# Patient Record
Sex: Female | Born: 1965
Health system: Southern US, Community
[De-identification: ages and names within clinical notes are randomized; demographics above are authoritative.]

## PROBLEM LIST (undated history)

## (undated) DIAGNOSIS — N201 Calculus of ureter: Secondary | ICD-10-CM

## (undated) DIAGNOSIS — K5730 Diverticulosis of large intestine without perforation or abscess without bleeding: Secondary | ICD-10-CM

## (undated) DIAGNOSIS — Z8719 Personal history of other diseases of the digestive system: Secondary | ICD-10-CM

## (undated) DIAGNOSIS — N8110 Cystocele, unspecified: Secondary | ICD-10-CM

## (undated) DIAGNOSIS — R112 Nausea with vomiting, unspecified: Secondary | ICD-10-CM

## (undated) DIAGNOSIS — Z860100 Personal history of colon polyps, unspecified: Secondary | ICD-10-CM

## (undated) DIAGNOSIS — Z8489 Family history of other specified conditions: Secondary | ICD-10-CM

## (undated) DIAGNOSIS — N200 Calculus of kidney: Secondary | ICD-10-CM

## (undated) DIAGNOSIS — Z9889 Other specified postprocedural states: Secondary | ICD-10-CM

## (undated) DIAGNOSIS — E119 Type 2 diabetes mellitus without complications: Secondary | ICD-10-CM

## (undated) HISTORY — PX: BLADDER SUSPENSION: SHX72

## (undated) HISTORY — DX: Type 2 diabetes mellitus without complications: E11.9

## (undated) HISTORY — PX: COLONOSCOPY: SHX174

## (undated) HISTORY — PX: ROTATOR CUFF REPAIR: SHX139

---

## 1990-03-31 HISTORY — PX: TUBAL LIGATION: SHX77

## 2001-03-31 HISTORY — PX: TONSILLECTOMY: SUR1361

## 2001-07-12 ENCOUNTER — Emergency Department (HOSPITAL_COMMUNITY): Admission: EM | Admit: 2001-07-12 | Discharge: 2001-07-12 | Payer: Self-pay | Admitting: Emergency Medicine

## 2001-07-12 ENCOUNTER — Encounter: Payer: Self-pay | Admitting: Emergency Medicine

## 2003-04-01 HISTORY — PX: VAGINAL HYSTERECTOMY: SUR661

## 2003-04-27 ENCOUNTER — Observation Stay (HOSPITAL_COMMUNITY): Admission: RE | Admit: 2003-04-27 | Discharge: 2003-04-28 | Payer: Self-pay | Admitting: *Deleted

## 2014-08-16 ENCOUNTER — Encounter (INDEPENDENT_AMBULATORY_CARE_PROVIDER_SITE_OTHER): Payer: Self-pay | Admitting: *Deleted

## 2015-08-07 ENCOUNTER — Encounter: Payer: Self-pay | Admitting: Gastroenterology

## 2015-09-10 ENCOUNTER — Other Ambulatory Visit: Payer: Self-pay | Admitting: Urology

## 2015-09-12 ENCOUNTER — Encounter (HOSPITAL_COMMUNITY): Payer: Self-pay

## 2015-09-12 ENCOUNTER — Telehealth: Payer: Self-pay | Admitting: Gastroenterology

## 2015-09-17 NOTE — Telephone Encounter (Signed)
Dr.Stark has reviewed records and has agreed to see patient. Due for next colonoscopy August of 2021. Recall colon entered.

## 2015-09-19 ENCOUNTER — Encounter: Payer: Self-pay | Admitting: Gastroenterology

## 2015-09-20 ENCOUNTER — Ambulatory Visit (HOSPITAL_COMMUNITY): Payer: 59

## 2015-09-20 ENCOUNTER — Ambulatory Visit (HOSPITAL_COMMUNITY)
Admission: RE | Admit: 2015-09-20 | Discharge: 2015-09-20 | Disposition: A | Discharge status: Home / Self Care | Payer: 59 | Source: Ambulatory Visit | Attending: Urology | Admitting: Urology

## 2015-09-20 ENCOUNTER — Encounter (HOSPITAL_COMMUNITY): Payer: Self-pay

## 2015-09-20 ENCOUNTER — Encounter (HOSPITAL_COMMUNITY)
Admission: RE | Disposition: A | Discharge status: Home / Self Care | Payer: Self-pay | Source: Ambulatory Visit | Attending: Urology

## 2015-09-20 DIAGNOSIS — F17200 Nicotine dependence, unspecified, uncomplicated: Secondary | ICD-10-CM | POA: Insufficient documentation

## 2015-09-20 DIAGNOSIS — N200 Calculus of kidney: Secondary | ICD-10-CM | POA: Diagnosis not present

## 2015-09-20 DIAGNOSIS — Z7982 Long term (current) use of aspirin: Secondary | ICD-10-CM | POA: Insufficient documentation

## 2015-09-20 DIAGNOSIS — Z791 Long term (current) use of non-steroidal anti-inflammatories (NSAID): Secondary | ICD-10-CM | POA: Diagnosis not present

## 2015-09-20 HISTORY — PX: EXTRACORPOREAL SHOCK WAVE LITHOTRIPSY: SHX1557

## 2015-09-20 HISTORY — DX: Other specified postprocedural states: R11.2

## 2015-09-20 HISTORY — DX: Other specified postprocedural states: Z98.890

## 2015-09-20 HISTORY — DX: Nausea with vomiting, unspecified: R11.2

## 2015-09-20 SURGERY — LITHOTRIPSY, ESWL
Anesthesia: LOCAL | Laterality: Right

## 2015-09-20 MED ORDER — SODIUM CHLORIDE 0.9 % IV SOLN
INTRAVENOUS | Status: DC
  Administered 2015-09-20 (×2): via INTRAVENOUS

## 2015-09-20 MED ORDER — CIPROFLOXACIN HCL 500 MG PO TABS
500.0000 mg | ORAL_TABLET | ORAL | Status: AC
  Administered 2015-09-20: 500 mg via ORAL
  Filled 2015-09-20: qty 1

## 2015-09-20 MED ORDER — HYDROCODONE-ACETAMINOPHEN 5-325 MG PO TABS
1.0000 | ORAL_TABLET | Freq: Four times a day (QID) | ORAL | Status: DC | PRN
  Administered 2015-09-20: 1 via ORAL
  Filled 2015-09-20: qty 1

## 2015-09-20 MED ORDER — DIPHENHYDRAMINE HCL 25 MG PO CAPS
25.0000 mg | ORAL_CAPSULE | ORAL | Status: AC
  Administered 2015-09-20: 25 mg via ORAL
  Filled 2015-09-20: qty 1

## 2015-09-20 MED ORDER — DIAZEPAM 5 MG PO TABS
10.0000 mg | ORAL_TABLET | ORAL | Status: AC
  Administered 2015-09-20: 10 mg via ORAL
  Filled 2015-09-20: qty 2

## 2015-09-20 MED ORDER — TAMSULOSIN HCL 0.4 MG PO CAPS: 0.4000 mg | ORAL_CAPSULE | Freq: Every day | ORAL | Status: DC

## 2015-09-20 NOTE — Discharge Instructions (Signed)
See [facility] discharge instructions in chart.     Moderate Conscious Sedation, Adult, Care After Refer to this sheet in the next few weeks. These instructions provide you with information on caring for yourself after your procedure. Your health care provider may also give you more specific instructions. Your treatment has been planned according to current medical practices, but problems sometimes occur. Call your health care provider if you have any problems or questions after your procedure. WHAT TO EXPECT AFTER THE PROCEDURE  After your procedure:  You may feel sleepy, clumsy, and have poor balance for several hours.  Vomiting may occur if you eat too soon after the procedure. HOME CARE INSTRUCTIONS  Do not participate in any activities where you could become injured for at least 24 hours. Do not:  Drive.  Swim.  Ride a bicycle.  Operate heavy machinery.  Cook.  Use power tools.  Climb ladders.  Work from a high place.  Do not make important decisions or sign legal documents until you are improved.  If you vomit, drink water, juice, or soup when you can drink without vomiting. Make sure you have little or no nausea before eating solid foods.  Only take over-the-counter or prescription medicines for pain, discomfort, or fever as directed by your health care provider.  Make sure you and your family fully understand everything about the medicines given to you, including what side effects may occur.  You should not drink alcohol, take sleeping pills, or take medicines that cause drowsiness for at least 24 hours.  If you smoke, do not smoke without supervision.  If you are feeling better, you may resume normal activities 24 hours after you were sedated.  Keep all appointments with your health care provider. SEEK MEDICAL CARE IF:  Your skin is pale or bluish in color.  You continue to feel nauseous or vomit.  Your pain is getting worse and is not helped  by medicine.  You have bleeding or swelling.  You are still sleepy or feeling clumsy after 24 hours. SEEK IMMEDIATE MEDICAL CARE IF:  You develop a rash.  You have difficulty breathing.  You develop any type of allergic problem.  You have a fever. MAKE SURE YOU:  Understand these instructions.  Will watch your condition.  Will get help right away if you are not doing well or get worse.   This information is not intended to replace advice given to you by your health care provider. Make sure you discuss any questions you have with your health care provider.   Document Released: 01/05/2013 Document Revised: 04/07/2014 Document Reviewed: 01/05/2013 Elsevier Interactive Patient Education [employer].

## 2015-09-20 NOTE — Op Note (Signed)
See [facility] OP note scanned into chart. Also because of the size, density, location and other factors that cannot be anticipated I feel this will likely be a staged procedure. This fact supersedes any indication in the scanned [facility] operative note to the contrary.  

## 2015-09-20 NOTE — H&P (Signed)
History of Present Illness Pt was evaluated a few weeks ago for possible obstructive uropathy but KUB showed no obvious ureteral obstruction. Urine c/s was pos. for infection and pt was placed on abx coverage. She also informed staff that she was being treated for diverticulitis when c/s results were alerted to her. I had suspicions for GI issues at our initial visit so the diverticulitis diagnosis supports such. I attempted to review her chart in the Epic system but there were no recent office visit notes regarding any diagnosis of diverticulitis.  She states today that she stopped the bactrim and was placed on cipro and flagyl for treatment of diverticulitis. She has completed both courses of those medications and is feeling significantly better today with no additional abdominal or back pain. She denies other LUTS today as well. She is also absent of fever. A CT image was [facility] at [facility] that indicated a non-obstructing 90mm calculus in the right kidney. I had previously seen this nonobstructing calculi on KUB imaging performed on 5/1. Mrs Nearing has a f/u appointment with lower GI in the next few weeks for further evaluation of her diverticulitis diagnosis. Vitals Vital Signs [Data Includes: Last 1 Day]  Recorded: [id number] 03:57PM  Blood Pressure: 119 / 77 Temperature: 98.3 F Heart Rate: 76 Physical Exam Constitutional: Well nourished and well developed . No acute distress.  Abdomen: The abdomen is soft and nontender. No masses are palpated. No CVA tenderness. No hernias are palpable. No hepatosplenomegaly noted.  Results/Data Urine [Data Includes: Last 1 Day]   [id number]  COLOR YELLOW   APPEARANCE CLOUDY   SPECIFIC GRAVITY 1.010   pH 7.0   GLUCOSE NEGATIVE   BILIRUBIN NEGATIVE   KETONE NEGATIVE   BLOOD 2+   PROTEIN NEGATIVE   NITRITE NEGATIVE   LEUKOCYTE ESTERASE NEGATIVE   SQUAMOUS EPITHELIAL/HPF 0-5 HPF  WBC NONE SEEN WBC/HPF  RBC 3-10 RBC/HPF  BACTERIA FEW HPF   CRYSTALS See Below HPF  CASTS NONE SEEN LPF  Yeast NONE SEEN HPF  Old records or history reviewed: Recent CT imaging reviewed in PACS system.  The following clinical lab reports were reviewed: UA with 3-10 red blood cells per high-power field and few bacteria noted. There is also amorphus sediment.  Assessment Assessed  1. Nephrolithiasis (N20.0) Plan Health Maintenance  1. UA With REFLEX; [Do Not Release]; Status:Complete; [id number] 03:45PM Nephrolithiasis  2. KUB; Status:Hold For - Exact Date,Appointment,Date of Service; Requested for:With next appointment;  3. Follow-up MD Office Follow-up Status: Hold For - Appointment,Date of Service  Requested for: [id number] Urine will be re-cultured today. I am suspicious that the stone in the right kidney may serve as a nidus for recurrent UTI. Pt is inquiring about treatment for such before she develops obstructive symptoms, colic.  I'll send a note to Dr Gaynelle Arabian and inquire about treatment. It is difficult to say whether lithotripsy would be a good treatment idea for her noting her recent infectious processes so I will defer judgement to her urologist. General kidney stone measures discussed with emphasis on increasing hydration, adding lemons to water for citric acid and avoidance of dietary oxalates.

## 2015-09-20 NOTE — Progress Notes (Signed)
Asst up to void. Steady on feet

## 2015-10-05 ENCOUNTER — Ambulatory Visit: Payer: Self-pay | Admitting: Gastroenterology

## 2015-11-26 ENCOUNTER — Ambulatory Visit (INDEPENDENT_AMBULATORY_CARE_PROVIDER_SITE_OTHER): Payer: 59 | Admitting: Gastroenterology

## 2015-11-26 ENCOUNTER — Encounter: Payer: Self-pay | Admitting: Gastroenterology

## 2015-11-26 VITALS — BP 120/80 | HR 88 | Ht 66.0 in | Wt 190.8 lb

## 2015-11-26 DIAGNOSIS — R140 Abdominal distension (gaseous): Secondary | ICD-10-CM

## 2015-11-26 DIAGNOSIS — K5900 Constipation, unspecified: Secondary | ICD-10-CM | POA: Diagnosis not present

## 2015-11-26 NOTE — Progress Notes (Signed)
    History of Present Illness: This is a 50 year old female self referred for the evaluation of chronic constipation, bloating, nausea and left lower quadrant pain. She is accompanied by her husband. She underwent evaluation in [place] and I have reviewed her records. She had a colonoscopy performed by Dr. Doristine Mango in August 2016 showing diffuse melanosis coli and a hyperplastic colon polyp. The exam was limited by the severity of melanosis coli. In May 2017 an abdominal/pelvic CT scan was performed for left lower quadrant pain that showed mild pericolonic inflammatory changes associated with diverticulosis. She states she was treated twice for diverticulitis. She takes an herbal laxative and MiraLAX with inadequate control of chronic constipation. She states she had a KUB performed in Dr. Arlyn Leak office recently that showed an increased colonic stool burden. Denies weight loss, diarrhea, change in stool caliber, melena, hematochezia, nausea, vomiting, dysphagia, reflux symptoms, chest pain.  Review of Systems: Pertinent positive and negative review of systems were noted in the above HPI section. All other review of systems were otherwise negative.  Current Medications, Allergies, Past Medical History, Past Surgical History, Family History and Social History were reviewed in [employer] record.  Physical Exam: General: Well developed, well nourished, no acute distress Head: Normocephalic and atraumatic Eyes:  sclerae anicteric, EOMI Ears: Normal auditory acuity Mouth: No deformity or lesions Neck: Supple, no masses or thyromegaly Lungs: Clear throughout to auscultation Heart: Regular rate and rhythm; no murmurs, rubs or bruits Abdomen: Soft, non tender and non distended. No masses, hepatosplenomegaly or hernias noted. Normal Bowel sounds Rectal: deferred Musculoskeletal: Symmetrical with no gross deformities  Skin: No lesions on visible extremities Pulses:   Normal pulses noted Extremities: No clubbing, cyanosis, edema or deformities noted Neurological: Alert oriented x 4, grossly nonfocal Cervical Nodes:  No significant cervical adenopathy Inguinal Nodes: No significant inguinal adenopathy Psychological:  Alert and cooperative. Normal mood and affect  Assessment and Recommendations:  1. Chronic constipation associated with abdominal bloating, nausea and occasional left lower quadrant pain and nausea. Increase MiraLAX to 2-3 times daily for the next several weeks and if her symptoms are not adequately controlled she will call us to start treatment with Trulance or Linzess.  2. History of diverticulosis and diverticulitis. Long-term high fiber diet with adequate daily water intake.  3. Colorectal cancer screening plan for August 2021 at a five-year interval given the severity of melanosis that limited the quality of the exam.

## 2015-11-26 NOTE — Patient Instructions (Signed)
Increase your Miralax to 2-3 x daily.   Call back in one month with a progress report of your symptoms.   Normal BMI (Body Mass Index- based on height and weight) is between 19 and 25. Your BMI today is Body mass index is 30.8 kg/m. Marland Kitchen Please consider follow up  regarding your BMI with your Primary Care Provider.  Thank you for choosing me and [facility].  Pricilla Riffle. Dagoberto Ligas., MD., Marval Regal

## 2015-12-04 ENCOUNTER — Encounter: Payer: Self-pay | Admitting: Gastroenterology

## 2015-12-21 ENCOUNTER — Other Ambulatory Visit: Payer: Self-pay | Admitting: Urology

## 2016-02-01 ENCOUNTER — Encounter (HOSPITAL_BASED_OUTPATIENT_CLINIC_OR_DEPARTMENT_OTHER): Payer: Self-pay | Admitting: *Deleted

## 2016-02-01 NOTE — Progress Notes (Signed)
NPO AFTER MN.  ARRIVE AT 0600.  NEEDS HG.  

## 2016-02-06 NOTE — Anesthesia Preprocedure Evaluation (Addendum)
Anesthesia Evaluation  Patient identified by MRN, date of birth, ID band Patient awake    Reviewed: Allergy & Precautions, NPO status , Patient's Chart, lab work & pertinent test results  History of Anesthesia Complications (+) PONV, Family history of anesthesia reaction and history of anesthetic complications (Pt Mother with PONV)  Airway Mallampati: II  TM Distance: >3 FB Neck ROM: Full    Dental  (+) Teeth Intact, Dental Advisory Given, Implants   Pulmonary Current Smoker,    Pulmonary exam normal breath sounds clear to auscultation       Cardiovascular negative cardio ROS Normal cardiovascular exam Rhythm:Regular Rate:Normal     Neuro/Psych negative neurological ROS  negative psych ROS   GI/Hepatic negative GI ROS, Neg liver ROS,   Endo/Other  Obesity   Renal/GU Renal disease (nephrolithiasis)     Musculoskeletal negative musculoskeletal ROS (+)   Abdominal   Peds  Hematology negative hematology ROS (+)   Anesthesia Other Findings Day of surgery medications reviewed with the patient.  Reproductive/Obstetrics                            Anesthesia Physical Anesthesia Plan  ASA: II  Anesthesia Plan: General   Post-op Pain Management:    Induction: Intravenous  Airway Management Planned: LMA  Additional Equipment:   Intra-op Plan:   Post-operative Plan: Extubation in OR  Informed Consent: I have reviewed the patients History and Physical, chart, labs and discussed the procedure including the risks, benefits and alternatives for the proposed anesthesia with the patient or authorized representative who has indicated his/her understanding and acceptance.   Dental advisory given  Plan Discussed with: CRNA  Anesthesia Plan Comments: (Risks/benefits of general anesthesia discussed with patient including risk of damage to teeth, lips, gum, and tongue, nausea/vomiting, allergic  reactions to medications, and the possibility of heart attack, stroke and death.  All patient questions answered.  Patient wishes to proceed.)        Anesthesia Quick Evaluation

## 2016-02-07 ENCOUNTER — Ambulatory Visit (HOSPITAL_BASED_OUTPATIENT_CLINIC_OR_DEPARTMENT_OTHER): Payer: 59 | Admitting: Anesthesiology

## 2016-02-07 ENCOUNTER — Encounter (HOSPITAL_BASED_OUTPATIENT_CLINIC_OR_DEPARTMENT_OTHER)
Admission: RE | Disposition: A | Discharge status: Home / Self Care | Payer: Self-pay | Source: Ambulatory Visit | Attending: Urology

## 2016-02-07 ENCOUNTER — Encounter (HOSPITAL_BASED_OUTPATIENT_CLINIC_OR_DEPARTMENT_OTHER): Payer: Self-pay | Admitting: *Deleted

## 2016-02-07 ENCOUNTER — Ambulatory Visit (HOSPITAL_BASED_OUTPATIENT_CLINIC_OR_DEPARTMENT_OTHER)
Admission: RE | Admit: 2016-02-07 | Discharge: 2016-02-07 | Disposition: A | Discharge status: Home / Self Care | Payer: 59 | Source: Ambulatory Visit | Attending: Urology | Admitting: Urology

## 2016-02-07 DIAGNOSIS — N132 Hydronephrosis with renal and ureteral calculous obstruction: Secondary | ICD-10-CM | POA: Diagnosis present

## 2016-02-07 DIAGNOSIS — Z90710 Acquired absence of both cervix and uterus: Secondary | ICD-10-CM | POA: Insufficient documentation

## 2016-02-07 DIAGNOSIS — Z7982 Long term (current) use of aspirin: Secondary | ICD-10-CM | POA: Diagnosis not present

## 2016-02-07 DIAGNOSIS — E669 Obesity, unspecified: Secondary | ICD-10-CM | POA: Insufficient documentation

## 2016-02-07 DIAGNOSIS — Z6830 Body mass index (BMI) 30.0-30.9, adult: Secondary | ICD-10-CM | POA: Diagnosis not present

## 2016-02-07 DIAGNOSIS — E7800 Pure hypercholesterolemia, unspecified: Secondary | ICD-10-CM | POA: Insufficient documentation

## 2016-02-07 DIAGNOSIS — N131 Hydronephrosis with ureteral stricture, not elsewhere classified: Secondary | ICD-10-CM

## 2016-02-07 DIAGNOSIS — F17200 Nicotine dependence, unspecified, uncomplicated: Secondary | ICD-10-CM | POA: Diagnosis not present

## 2016-02-07 HISTORY — PX: CYSTOSCOPY WITH RETROGRADE PYELOGRAM, URETEROSCOPY AND STENT PLACEMENT: SHX5789

## 2016-02-07 HISTORY — DX: Family history of other specified conditions: Z84.89

## 2016-02-07 HISTORY — DX: Diverticulosis of large intestine without perforation or abscess without bleeding: K57.30

## 2016-02-07 HISTORY — DX: Personal history of other diseases of the digestive system: Z87.19

## 2016-02-07 HISTORY — DX: Calculus of kidney: N20.0

## 2016-02-07 HISTORY — DX: Personal history of colon polyps, unspecified: Z86.0100

## 2016-02-07 HISTORY — DX: Personal history of colonic polyps: Z86.010

## 2016-02-07 HISTORY — DX: Calculus of ureter: N20.1

## 2016-02-07 SURGERY — CYSTOURETEROSCOPY, WITH RETROGRADE PYELOGRAM AND STENT INSERTION
Anesthesia: General | Laterality: Right

## 2016-02-07 MED ORDER — ACETAMINOPHEN 500 MG PO TABS
ORAL_TABLET | ORAL | Status: AC
  Filled 2016-02-07: qty 2

## 2016-02-07 MED ORDER — DEXAMETHASONE SODIUM PHOSPHATE 4 MG/ML IJ SOLN
INTRAMUSCULAR | Status: DC | PRN
  Administered 2016-02-07: 10 mg via INTRAVENOUS

## 2016-02-07 MED ORDER — LIDOCAINE HCL 2 % EX GEL
CUTANEOUS | Status: AC
  Filled 2016-02-07: qty 5

## 2016-02-07 MED ORDER — KETOROLAC TROMETHAMINE 30 MG/ML IJ SOLN
INTRAMUSCULAR | Status: DC | PRN
  Administered 2016-02-07: 30 mg via INTRAVENOUS

## 2016-02-07 MED ORDER — LACTATED RINGERS IV SOLN
INTRAVENOUS | Status: DC
  Administered 2016-02-07: 07:00:00 via INTRAVENOUS
  Filled 2016-02-07: qty 1000

## 2016-02-07 MED ORDER — LIDOCAINE 2% (20 MG/ML) 5 ML SYRINGE
INTRAMUSCULAR | Status: AC
  Filled 2016-02-07: qty 5

## 2016-02-07 MED ORDER — LIDOCAINE 2% (20 MG/ML) 5 ML SYRINGE
INTRAMUSCULAR | Status: DC | PRN
Start: 2016-02-07 — End: 2016-02-07
  Administered 2016-02-07: 100 mg via INTRAVENOUS

## 2016-02-07 MED ORDER — DEXAMETHASONE SODIUM PHOSPHATE 10 MG/ML IJ SOLN
INTRAMUSCULAR | Status: AC
  Filled 2016-02-07: qty 1

## 2016-02-07 MED ORDER — 0.9 % SODIUM CHLORIDE (POUR BTL) OPTIME
TOPICAL | Status: DC | PRN
  Administered 2016-02-07: 500 mL

## 2016-02-07 MED ORDER — ONDANSETRON HCL 4 MG/2ML IJ SOLN
INTRAMUSCULAR | Status: AC
  Filled 2016-02-07: qty 2

## 2016-02-07 MED ORDER — ACETAMINOPHEN 325 MG PO TABS
ORAL_TABLET | ORAL | Status: DC | PRN
  Administered 2016-02-07: 1000 mg via ORAL

## 2016-02-07 MED ORDER — MIDAZOLAM HCL 5 MG/5ML IJ SOLN
INTRAMUSCULAR | Status: DC | PRN
  Administered 2016-02-07: 2 mg via INTRAVENOUS

## 2016-02-07 MED ORDER — SCOPOLAMINE 1 MG/3DAYS TD PT72SCOPOLAMINE 1 MG/3DAYS
1.0000 | MEDICATED_PATCH | TRANSDERMAL | Status: DC
Start: 2016-02-07 — End: 2016-02-07
  Administered 2016-02-07: 1.5 mg via TRANSDERMAL
  Filled 2016-02-07: qty 1

## 2016-02-07 MED ORDER — HYOSCYAMINE SULFATE SL 0.125 MG SL SUBL: 0.1250 mg | SUBLINGUAL_TABLET | Freq: Three times a day (TID) | SUBLINGUAL | 0 refills | Status: DC | PRN

## 2016-02-07 MED ORDER — KETOROLAC TROMETHAMINE 30 MG/ML IJ SOLN
INTRAMUSCULAR | Status: AC
  Filled 2016-02-07: qty 1

## 2016-02-07 MED ORDER — ONDANSETRON HCL 4 MG/2ML IJ SOLN
INTRAMUSCULAR | Status: DC | PRN
  Administered 2016-02-07: 4 mg via INTRAVENOUS

## 2016-02-07 MED ORDER — PHENAZOPYRIDINE HCL 200 MG PO TABS
200.0000 mg | ORAL_TABLET | Freq: Three times a day (TID) | ORAL | Status: DC
  Administered 2016-02-07: 200 mg via ORAL
  Filled 2016-02-07: qty 1

## 2016-02-07 MED ORDER — CEFAZOLIN SODIUM-DEXTROSE 2-4 GM/100ML-% IV SOLN
INTRAVENOUS | Status: AC
  Filled 2016-02-07: qty 100

## 2016-02-07 MED ORDER — PHENAZOPYRIDINE HCL 200 MG PO TABS: 200.0000 mg | ORAL_TABLET | Freq: Three times a day (TID) | ORAL | 3 refills | Status: AC | PRN

## 2016-02-07 MED ORDER — SCOPOLAMINE 1 MG/3DAYS TD PT72
MEDICATED_PATCH | TRANSDERMAL | Status: AC
  Filled 2016-02-07: qty 1

## 2016-02-07 MED ORDER — IOHEXOL 300 MG/ML  SOLN
INTRAMUSCULAR | Status: DC | PRN
  Administered 2016-02-07: 1.5 mL via INTRAVENOUS

## 2016-02-07 MED ORDER — CEFAZOLIN SODIUM-DEXTROSE 2-4 GM/100ML-% IV SOLN
2.0000 g | INTRAVENOUS | Status: AC
  Administered 2016-02-07: 2 g via INTRAVENOUS
  Filled 2016-02-07: qty 100

## 2016-02-07 MED ORDER — FENTANYL CITRATE (PF) 100 MCG/2ML IJ SOLN
INTRAMUSCULAR | Status: AC
  Filled 2016-02-07: qty 2

## 2016-02-07 MED ORDER — PROMETHAZINE HCL 25 MG/ML IJ SOLN
6.2500 mg | INTRAMUSCULAR | Status: DC | PRN
Start: 2016-02-07 — End: 2016-02-07
  Filled 2016-02-07: qty 1

## 2016-02-07 MED ORDER — PROPOFOL 10 MG/ML IV BOLUS
INTRAVENOUS | Status: DC | PRN
  Administered 2016-02-07: 50 mg via INTRAVENOUS
  Administered 2016-02-07: 200 mg via INTRAVENOUS

## 2016-02-07 MED ORDER — FENTANYL CITRATE (PF) 100 MCG/2ML IJ SOLN
25.0000 ug | INTRAMUSCULAR | Status: DC | PRN
  Filled 2016-02-07: qty 1

## 2016-02-07 MED ORDER — SODIUM CHLORIDE 0.9 % IR SOLN
Status: DC | PRN
  Administered 2016-02-07 (×2): 3000 mL

## 2016-02-07 MED ORDER — HYDROCODONE-ACETAMINOPHEN 5-300 MG PO TABS: 5.0000 mg | ORAL_TABLET | Freq: Four times a day (QID) | ORAL | 0 refills | Status: AC | PRN

## 2016-02-07 MED ORDER — FENTANYL CITRATE (PF) 100 MCG/2ML IJ SOLN
INTRAMUSCULAR | Status: DC | PRN
  Administered 2016-02-07 (×2): 25 ug via INTRAVENOUS

## 2016-02-07 MED ORDER — PHENAZOPYRIDINE HCL 100 MG PO TABS
ORAL_TABLET | ORAL | Status: AC
  Filled 2016-02-07: qty 2

## 2016-02-07 MED ORDER — PROPOFOL 10 MG/ML IV BOLUS
INTRAVENOUS | Status: AC
  Filled 2016-02-07: qty 40

## 2016-02-07 MED ORDER — BELLADONNA ALKALOIDS-OPIUM 16.2-60 MG RE SUPP
RECTAL | Status: AC
  Filled 2016-02-07: qty 1

## 2016-02-07 MED ORDER — MIDAZOLAM HCL 2 MG/2ML IJ SOLN
INTRAMUSCULAR | Status: AC
  Filled 2016-02-07: qty 2

## 2016-02-07 SURGICAL SUPPLY — 30 items
BAG DRAIN URO-CYSTO SKYTR STRL (DRAIN) ×4 IMPLANT
BAG DRN UROCATH (DRAIN) ×2
BOOTIES KNEE HIGH SLOAN (MISCELLANEOUS) ×4 IMPLANT
CATH INTERMIT  6FR 70CM (CATHETERS) ×3 IMPLANT
CATH URET 5FR 28IN CONE TIP (BALLOONS)
CATH URET 5FR 28IN OPEN ENDED (CATHETERS) IMPLANT
CATH URET 5FR 70CM CONE TIP (BALLOONS) IMPLANT
CATH URET DUAL LUMEN 6-10FR 50 (CATHETERS) ×3 IMPLANT
CLOTH BEACON ORANGE TIMEOUT ST (SAFETY) ×4 IMPLANT
ELECT REM PT RETURN 9FT ADLT (ELECTROSURGICAL)
ELECTRODE REM PT RTRN 9FT ADLT (ELECTROSURGICAL) IMPLANT
GLOVE BIO SURGEON STRL SZ7.5 (GLOVE) ×4 IMPLANT
GOWN STRL REUS W/ TWL LRG LVL3 (GOWN DISPOSABLE) ×2 IMPLANT
GOWN STRL REUS W/ TWL XL LVL3 (GOWN DISPOSABLE) ×2 IMPLANT
GOWN STRL REUS W/TWL LRG LVL3 (GOWN DISPOSABLE) ×4
GOWN STRL REUS W/TWL XL LVL3 (GOWN DISPOSABLE) ×4
GUIDEWIRE 0.038 PTFE COATED (WIRE) IMPLANT
GUIDEWIRE ANG ZIPWIRE 038X150 (WIRE) IMPLANT
GUIDEWIRE STR DUAL SENSOR (WIRE) ×6 IMPLANT
IV NS IRRIG 3000ML ARTHROMATIC (IV SOLUTION) ×8 IMPLANT
KIT BALLIN UROMAX 15FX10 (LABEL) IMPLANT
KIT BALLN UROMAX 15FX4 (MISCELLANEOUS) IMPLANT
KIT BALLN UROMAX 26 75X4 (MISCELLANEOUS)
KIT ROOM TURNOVER WOR (KITS) ×4 IMPLANT
MANIFOLD NEPTUNE II (INSTRUMENTS) ×3 IMPLANT
SET HIGH PRES BAL DIL (LABEL)
SHEATH ACCESS URETERAL 38CM (SHEATH) ×3 IMPLANT
STENT URET 6FRX24 CONTOUR (STENTS) ×3 IMPLANT
TUBE CONNECTING 12'X1/4 (SUCTIONS)
TUBE CONNECTING 12X1/4 (SUCTIONS) IMPLANT

## 2016-02-07 NOTE — Addendum Note (Signed)
Addendum  created 02/07/16 1037 by Bonney Aid, CRNA   Charge Capture section accepted

## 2016-02-07 NOTE — Op Note (Signed)
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  Surgeon:  Chauncey Cruel. Gaynelle Arabian, MD  First assistant:  None  Anesthesia: Gen. LMA  Preparation: After appropriate anesthesia, the patient was brought the operating room, placed on the operating table in the dorsal supine position where general LMA anesthesia was induced. She was then replaced in the dorsal lithotomy position with the pubis was prepped with Betadine solution and draped in usual fashion. The history was reviewed. The arm band was previously marked. The history was reviewed with the patient and her husband in the preoperative area as well as no stone had been passed and the patient is aware of since her lithotripsy, or since her previous  office visit.  Review history:  Casey Mason is a 50 year-old female established patient who is here for renal calculi after a surgical intervention.  The problem is on the right side. She had eswl for treatment of her renal calculi. Patient denies stent, ureteroscopy, and percutaneous lithotomy. This procedure was done 09/20/2015. She did pass stone fragments. This is not her first kidney stone. She does not have a stent in place.   She is currently having flank pain. She denies having back pain, groin pain, nausea, vomiting, fever, and chills.   She does not have dysuria. She does not have urgency. She does not have frequency.   Pt had ESWL on 6/22 for a right lower pole calculi. She had only passed some fine gravel but her symptoms were minimal. There was still a sizeable stone burden in the right kidney at that time but it  was noted to have changed configuration.   She presented to the ED in [place] on 7/29 where CT imaging noted a 10 and 5 mm right sided burden. The 5 mm calculus was noted at the UVJ and the other stone was seen in the lower pole of the right kidney. Her symptoms have improved and she has seen the intermittent passage of fine gravel. She continues to have intermittent right lower back and flank pain but this has been manageable. Denies fevers or recent changes in LUTS.   She returned for a KUB & to discuss surgery scheduled for 02/07/16 for cysto/Rt ureteroscopy/basket extraction/Rt pyeloscopy/Rt JJ stent.    Statement of  Likelihood of Success: Excellent. TIME-OUT observed.:  Procedure: Cystourethroscopy, was, showing normal vaginal orifice, and normal urethra. The trigone is normal, with clear reflux in both orifices. There is no evidence of bladder stone, tumor, or diverticular formation. Right retrograde polygrams performed, which shows dilation of the right ureter, down to the level of the ureterovesical junction. No stone is seen on retrograde, however. No stone is seen in the kidney on retrograde pyelogram either.  A 0.038 guidewire is passed into the kidney, and coiled in the lower pole calyx. A double bridge semirigid ureteroscope was then passed through the right ureteral orifice, and into the intramural ureter. It is slowly advanced in a retrograde fashion, with minimal water inflow, and a ureterovesical junction stricture is identified. I was able to dilate the stricture using the double lumen catheter, by first using the internal 9 [place] portion, followed by the outer 12 [place] portion. Although tight, this allowed for ureteroscopy. Rigid ureteroscopy revealed  no evidence of stone, and pyeloscopy was accomplished, with no evidence of stone within the bladder or within the renal pelvis on the right side. The rigid ureteroscope was removed, and the dual-lumen catheter was placed over the safety  wire, and a second wire was placed to the upper pole calyx. The dual-lumen catheter was placed, and the internal 9 [place] portion removed. The digital flexor ureteroscope was then passed into the upper ureter, and into the renal pelvis. I was able to follow the original guidewire into the lower pole, where suspected stone material would be found. However, no stone was identified. There was clot present, but when moved, no stone was identified. I was able to use the flexible digital ureteroscope to look in every available calyx, but no stone could be identified. The renal pelvis was clear of any stone material. I elected to leave a double-J stent, because of clot formation, and because of dilation of the ureterovesical junction stricture.  I removed the flexor ureteroscope, and the dilating sheath, leaving the safety wire intact. Under force Control, I back loaded the 6 [place] by 24 cm double-J stent, through the cystoscope, and passed it into the renal pelvis, where was coiled. I'd then drained the bladder, and left the double-J stent in good position. The suture attachment was taped to the pubis. The patient received IV Toradol, as well as IV Ancef. She was awakened and taken to recovery room in good condition.

## 2016-02-07 NOTE — Anesthesia Procedure Notes (Addendum)
Procedure Name: LMA Insertion Date/Time: 02/07/2016 7:42 AM Performed by: Bethena Roys T Pre-anesthesia Checklist: Patient identified, Emergency Drugs available, Suction available and Patient being monitored Patient Re-evaluated:Patient Re-evaluated prior to inductionOxygen Delivery Method: Circle system utilized Preoxygenation: Pre-oxygenation with 100% oxygen Intubation Type: IV induction Ventilation: Mask ventilation without difficulty LMA: LMA inserted LMA Size: 4.0 Number of attempts: 1 Airway Equipment and Method: Bite block Placement Confirmation: positive ETCO2 Dental Injury: Teeth and Oropharynx as per pre-operative assessment

## 2016-02-07 NOTE — Interval H&P Note (Signed)
History and Physical Interval Note:  02/07/2016 7:27 AM  Casey Mason  has presented today for surgery, with the diagnosis of Right Distal Ureteral Stone  The various methods of treatment have been discussed with the patient and family. After consideration of risks, benefits and other options for treatment, the patient has consented to  Procedure(s): CYSTOSCOPY WITH RIGHT URETEROSCOPY, BASKET EXTRACTION OF RIGHT DISTAL STONE, FLEXIBLE RIGHT URETEROSCOPY, RIGHT PYELOSCOPY, HOLMIUM LASER OF RIGHT LOWER PELVIC STONE, BASKET EXTRACTION AND RIGHT DOUBLE STENT PLACEMENT WITH TETHER (Right) HOLMIUM LASER APPLICATION (N/A) as a surgical intervention .  The patient's history has been reviewed, patient examined, no change in status, stable for surgery.  I have reviewed the patient's chart and labs.  Questions were answered to the patient's satisfaction.     Casey Mason I Casey Mason Pt itches from Percocet, but can take Vicodin without problem for post-op pain.  Plan for right ureteroscopy and pyleopcopy and stone removal of any remaining stones.

## 2016-02-07 NOTE — Discharge Instructions (Signed)

## 2016-02-07 NOTE — Anesthesia Postprocedure Evaluation (Signed)
Anesthesia Post Note  Patient: Casey Mason  Procedure(s) Performed: Procedure(s) (LRB): CYSTOSCOPY WITH RIGHT PYLEOGRAM  RIGHT URETEROSCOPY, , DIALATION OF RIGHT URETER STRICUTURE FLEXIBLE RIGHT URETEROSCOPY, RIGHT PYELOSCOPY,  AND RIGHT DOUBLE STENT PLACEMENT WITH TETHER (Right) HOLMIUM LASER APPLICATION (N/A)  Patient location during evaluation: PACU Anesthesia Type: General Level of consciousness: awake and alert Pain management: pain level controlled Vital Signs Assessment: post-procedure vital signs reviewed and stable Respiratory status: spontaneous breathing, nonlabored ventilation, respiratory function stable and patient connected to nasal cannula oxygen Cardiovascular status: blood pressure returned to baseline and stable Postop Assessment: no signs of nausea or vomiting Anesthetic complications: no    Last Vitals:  Vitals:   02/07/16 0900 02/07/16 0949  BP: 103/62 116/74  Pulse: (!) 58 (!) 56  Resp: 10 16  Temp:  36.7 C    Last Pain:  Vitals:   02/07/16 0949  TempSrc: Oral                 Catalina Gravel

## 2016-02-07 NOTE — Transfer of Care (Signed)
Immediate Anesthesia Transfer of Care Note  Patient: Casey Mason  Procedure(s) Performed: Procedure(s): CYSTOSCOPY WITH RIGHT PYLEOGRAM  RIGHT URETEROSCOPY, , DIALATION OF RIGHT URETER STRICUTURE FLEXIBLE RIGHT URETEROSCOPY, RIGHT PYELOSCOPY,  AND RIGHT DOUBLE STENT PLACEMENT WITH TETHER (Right) HOLMIUM LASER APPLICATION (N/A)  Patient Location: PACU  Anesthesia Type:General  Level of Consciousness: awake and oriented  Airway & Oxygen Therapy: Patient Spontanous Breathing and Patient connected to nasal cannula oxygen  Post-op Assessment: Report given to RN  Post vital signs: Reviewed and stable  Last Vitals: 122/48, 76, 18, 99%, 97.5 Vitals:   02/07/16 0622  BP: 117/69  Pulse: 72  Resp: 20  Temp: 37.1 C    Last Pain:  Vitals:   02/07/16 0622  TempSrc: Oral      Patients Stated Pain Goal: 5 ([id number])  Complications: No apparent anesthesia complications

## 2016-02-07 NOTE — H&P (Signed)
Office Visit Report     01/16/2016   --------------------------------------------------------------------------------   Casey Mason  MRN: [id number]  PRIMARY CARE:  MEDICAID [facility]  DOB: [birth year], 50 year old Female  REFERRING:    SSN: [id number]  PROVIDER:  Carolan Clines, M.D.    LOCATION:  [facility] [phone]   --------------------------------------------------------------------------------   CC: I have kidney stones.(Surgery)  HPI: Casey Mason is a 50 year-old female established patient who is here for renal calculi after a surgical intervention.  The problem is on the right side. She had eswl for treatment of her renal calculi. Patient denies stent, ureteroscopy, and percutaneous lithotomy. This procedure was done 09/20/2015. She did pass stone fragments. This is not her first kidney stone. She does not have a stent in place.   She is currently having flank pain. She denies having back pain, groin pain, nausea, vomiting, fever, and chills.   She does not have dysuria. She does not have urgency. She does not have frequency.   Pt had ESWL on 6/22 for a right lower pole calculi. She had only passed some fine gravel but her symptoms were minimal. There was still a sizeable stone burden in the right kidney at that time but it was noted to have changed configuration.   She presented to the ED in [place] on 7/29 where CT imaging noted a 10 and 5 mm right sided burden. The 5 mm calculus was noted at the UVJ and the other stone was seen in the lower pole of the right kidney. Her symptoms have improved and she has seen the intermittent passage of fine gravel. She continues to have intermittent right lower back and flank pain but this has been manageable. Denies fevers or recent changes in LUTS.   She returns today for a KUB & to discuss surgery scheduled for 02/07/16 for cysto/Rt ureteroscopy/basket extraction/Rt pyeloscopy/Rt JJ stent.     ALLERGIES:  oxycodone - Itching, Hives Sulfa Drugs    MEDICATIONS: Tamsulosin Hcl 0.4 mg capsule, ext release 24 hr 1 capsule PO Daily  Aspirin 81 MG Oral Tablet Chewable Oral  Calcium TABS Oral  Krill Oil 300 MG Oral Capsule Oral  Magnesium Citrate SOLN Oral  Multi-Day Vitamins TABS Oral  Vitamin B-6 TABS Oral  Vitamin D 2000 UNIT Oral Tablet Oral     GU PSH: Hysterectomy Unilat SO - 07-23-11 Renal ESWL - 09/20/2015      PSH Notes: Rotator Cuff Repair   NON-GU PSH: Cesarean Delivery Only - Jul 23, 2011    GU PMH: Calculus Kidney and Ureter - 11/19/2015 Kidney Stone - 10/18/2015, Kidney Stone, Nephrolithiasis - 08/13/2015, Bilateral kidney stones, - 02/07/2014 Urinary Tract Inf, Unspec site, UTI (urinary tract infection) - 08/02/2015, Pyuria, - 07/30/2015 Left lower quadrant pain, Abdominal pain, LLQ (left lower quadrant) - 07/30/2015 Abdominal Pain Unspec, Right flank pain - 02/08/2014 Other microscopic hematuria, Microscopic hematuria - 01/25/2014 Urethral caruncle, Urethra, polyp - 22-Jul-2012 Bladder, Neoplasm of uncertain behavior, 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 Has smoked since 08/19/2015.  Social Drinker.  Drinks 1 caffeinated drink per day. 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    REVIEW OF SYSTEMS:    GU Review Female:   Patient denies frequent urination, hard to postpone urination, burning /pain with urination, get up at night to urinate, leakage of urine, stream starts and stops, trouble starting your  stream, have to strain to urinate, and currently pregnant.  Gastrointestinal (Upper):   Patient denies nausea, vomiting, and indigestion/ heartburn.  Gastrointestinal (Lower):   Patient reports constipation. Patient denies diarrhea.  Constitutional:   Patient reports night sweats. Patient denies fever, weight loss, and fatigue.  Skin:   Patient denies skin rash/ lesion and itching.  Eyes:   Patient denies blurred vision and double vision.  Ears/ Nose/ Throat:   Patient denies sore throat and sinus problems.  Hematologic/Lymphatic:   Patient denies swollen glands and easy bruising.  Cardiovascular:   Patient denies leg swelling and chest pains.  Respiratory:   Patient denies cough and shortness of breath.  Endocrine:   Patient denies excessive thirst.  Musculoskeletal:   Patient denies back pain and joint pain.  Neurological:   Patient denies headaches and dizziness.  Psychologic:   Patient denies depression and anxiety.   VITAL SIGNS:      01/16/2016 02:33 PM  BP 127/76 mmHg  Pulse 65 /min  Temperature 98.2 F / 37 C   GU PHYSICAL EXAMINATION:    External Genitalia: No hirsutism, no rash, no scarring, no cyst, no erythematous lesion, no papular lesion, no blanched lesion, no warty lesion. No edema.  Urethral Meatus: Normal size. Normal position. No discharge.  Urethra: No tenderness, no mass, no scarring. No hypermobility. No leakage.  Bladder: Normal to palpation, no tenderness, no mass, normal size.  Vagina: No atrophy, no stenosis. No rectocele. No cystocele. No enterocele.   MULTI-SYSTEM PHYSICAL EXAMINATION:    Constitutional: Well-nourished. No physical deformities. Normally developed. Good grooming.  Neck: Neck symmetrical, not swollen. Normal tracheal position.  Respiratory: No labored breathing, no use of accessory muscles.   Cardiovascular: Normal temperature, normal extremity pulses, no swelling, no varicosities.  Lymphatic: No enlargement of neck, axillae, groin.  Skin:  No paleness, no jaundice, no cyanosis. No lesion, no ulcer, no rash.  Neurologic / Psychiatric: Oriented to time, oriented to place, oriented to person. No depression, no anxiety, no agitation.  Gastrointestinal: No mass, no tenderness, no rigidity, non obese abdomen.  Eyes: Normal conjunctivae. Normal eyelids.  Ears, Nose, Mouth, and Throat: Left ear no scars, no lesions, no masses. Right ear no scars, no lesions, no masses. Nose no scars, no lesions, no masses. Normal hearing. Normal lips.  Musculoskeletal: Normal gait and station of head and neck.     PAST DATA REVIEWED:  Source Of History:  Patient  Records Review:   Previous Patient Records  X-Ray Review: KUB: Reviewed Films. Discussed With Patient.  C.T. Stone Protocol: Reviewed Films. Discussed With Patient.     01/16/16 11/19/15 10/18/15 08/13/15 07/30/15 02/07/14 01/25/14 01/31/13  Urinalysis  Urine Appearance Clear          Urine Specimen Voided          Urine Color Yellow          Urine Glucose Neg          Urine Bilirubin Neg  Neg   NEGATIVE  NEGATIVE  NEG  NEG  NEG   Urine Ketones Neg  Neg   NEGATIVE  NEGATIVE  NEG  NEG  NEG  Urine Blood 2+  2+  2+  2+  2+  TRACE  LARGE  MOD   Urine pH 6.0  6.5  6.5  7.0  6.0  6.5  6.0  6.5   Urine Protein Neg  Neg   NEGATIVE  NEGATIVE  NEG  NEG  NEG   Urine Urobilinogen 0.2  0.2     0.2  0.2  0.2   Urine Nitrites Neg  Neg  Neg  NEGATIVE  NEGATIVE  NEG  NEG  NEG   Urine Leukocyte Esterase  Neg  Neg  NEGATIVE  3+  NEG  NEG  NEG   Urine WBC/hpf NS (Not Seen)  NS (Not Seen)  0-5/hpf  NONE SEEN  20-40  0-2  0-2  0-2   Urine RBC/hpf 3 - 10/hpf  NS (Not Seen)  0-2/hpf  3-10  3-10  0-2  7-10  0-2   Urine Epithelial Cells NS (Not Seen)  NS (Not Seen)         Urine Bacteria NS (Not Seen)  NS (Not Seen)  RARE 0-9  FEW  FEW  FEW  RARE  MODERATE   Urine Mucous Not Present  Not Present         Urine Yeast NS (Not Seen)  NS (Not Seen)   NONE SEEN  NONE SEEN      Urine Trichomonas Not Present  Not  Present         Urine Cystals NS (Not Seen)  NS (Not Seen)   See Below  NONE SEEN  NONE SEEN  NONE SEEN  NONE SEEN   Urine Casts NS (Not Seen)  NS (Not Seen)   NONE SEEN  NONE SEEN  NONE SEEN  NONE SEEN  NONE SEEN   Urine Sperm Not Present  Not Present         Urine Squamous Epithelial    0-5  0-5  FEW  RARE  MODERATE    PROCEDURES:         KUB - 74000  A single view of the abdomen is obtained.               Urinalysis w/Scope - 81001 Dipstick Dipstick Cont'd Micro  Specimen: Voided Bilirubin: Neg WBC/hpf: NS (Not Seen)  Color: Yellow Ketones: Neg RBC/hpf: 3 - 10/hpf  Appearance: Clear Blood: 2+ Bacteria: NS (Not Seen)  Specific Gravity: 1.010 Protein: Neg Cystals: NS (Not Seen)  pH: 6.0 Urobilinogen: 0.2 Casts: NS (Not Seen)  Glucose: Neg Nitrites: Neg Trichomonas: Not Present      Mucous: Not Present      Epithelial Cells: NS (Not Seen)      Yeast: NS (Not Seen)      Sperm: Not Present    ASSESSMENT:      ICD-10 Details  1 GU:   Calculus Ureter - N20.1           Notes:   50 year old female status post lithotripsy on June 22 for right lower pole stones. The patient passed some gravel, but still had some sizable stone burden in the right kidney. She presented to the emergency room on July 29, with CT showing a 5 mm right UVJ junction stone. She was placed on Flomax, and follow since then without passage of the stone. She is scheduled for ureteroscopy November for basket extraction and laser fragmentation of the stone. We discussed her surgery today, and possibility of double-J stent. I may leave a suture attached to the stent, for  her own removal if necessary.    PLAN:           Document Letter(s):  Created for Patient: Clinical Summary   Signed by Carolan Clines, M.D. on 01/16/16 at 3:04 PM (EDT)     The information contained in this medical record document is considered private and confidential patient information. This information can only be used for the medical  diagnosis and/or medical services that are being provided by the patient's selected caregivers. This information can only be distributed outside of the patient's care if the patient agrees and signs waivers of authorization for this information to be sent to an outside source or route.

## 2016-02-08 ENCOUNTER — Encounter (HOSPITAL_BASED_OUTPATIENT_CLINIC_OR_DEPARTMENT_OTHER): Payer: Self-pay | Admitting: Urology

## 2016-02-10 ENCOUNTER — Emergency Department (HOSPITAL_COMMUNITY)
Admission: EM | Admit: 2016-02-10 | Discharge: 2016-02-10 | Disposition: A | Discharge status: Home / Self Care | Payer: 59 | Attending: Emergency Medicine | Admitting: Emergency Medicine

## 2016-02-10 ENCOUNTER — Encounter (HOSPITAL_COMMUNITY): Payer: Self-pay

## 2016-02-10 DIAGNOSIS — R109 Unspecified abdominal pain: Secondary | ICD-10-CM | POA: Diagnosis not present

## 2016-02-10 DIAGNOSIS — Z7982 Long term (current) use of aspirin: Secondary | ICD-10-CM | POA: Insufficient documentation

## 2016-02-10 DIAGNOSIS — F17210 Nicotine dependence, cigarettes, uncomplicated: Secondary | ICD-10-CM | POA: Diagnosis not present

## 2016-02-10 DIAGNOSIS — Z79899 Other long term (current) drug therapy: Secondary | ICD-10-CM | POA: Diagnosis not present

## 2016-02-10 LAB — URINALYSIS, ROUTINE W REFLEX MICROSCOPIC
Bilirubin Urine: NEGATIVE
Glucose, UA: NEGATIVE mg/dL
Ketones, ur: NEGATIVE mg/dL
LEUKOCYTES UA: NEGATIVE
Nitrite: POSITIVE — AB
PROTEIN: NEGATIVE mg/dL
Specific Gravity, Urine: 1.008 (ref 1.005–1.030)
pH: 6.5 (ref 5.0–8.0)

## 2016-02-10 LAB — URINE MICROSCOPIC-ADD ON
BACTERIA UA: NONE SEEN
RBC / HPF: NONE SEEN RBC/hpf (ref 0–5)
WBC, UA: NONE SEEN WBC/hpf (ref 0–5)

## 2016-02-10 MED ORDER — HYDROMORPHONE HCL 1 MG/ML IJ SOLN
INTRAMUSCULAR | Status: AC
  Filled 2016-02-10: qty 1

## 2016-02-10 MED ORDER — ONDANSETRON HCL 4 MG/2ML IJ SOLN
4.0000 mg | Freq: Once | INTRAMUSCULAR | Status: AC
  Administered 2016-02-10: 4 mg via INTRAVENOUS
  Filled 2016-02-10: qty 2

## 2016-02-10 MED ORDER — HYDROMORPHONE HCL 1 MG/ML IJ SOLN
0.5000 mg | Freq: Once | INTRAMUSCULAR | Status: AC
  Administered 2016-02-10: 0.5 mg via INTRAVENOUS
  Filled 2016-02-10: qty 1

## 2016-02-10 MED ORDER — OXYCODONE-ACETAMINOPHEN 5-325 MG PO TABS: 1.0000 | ORAL_TABLET | Freq: Once | ORAL | Status: DC

## 2016-02-10 MED ORDER — LORAZEPAM 2 MG/ML IJ SOLN
0.5000 mg | Freq: Once | INTRAMUSCULAR | Status: AC
  Administered 2016-02-10: 0.5 mg via INTRAVENOUS
  Filled 2016-02-10: qty 1

## 2016-02-10 MED ORDER — HYDROMORPHONE HCL 1 MG/ML IJ SOLN
1.0000 mg | Freq: Once | INTRAMUSCULAR | Status: AC
  Administered 2016-02-10: 1 mg via INTRAVENOUS

## 2016-02-10 MED ORDER — HYDROCODONE-ACETAMINOPHEN 5-325 MG PO TABS
2.0000 | ORAL_TABLET | Freq: Once | ORAL | Status: AC
  Administered 2016-02-10: 2 via ORAL
  Filled 2016-02-10: qty 2

## 2016-02-10 MED ORDER — HYDROMORPHONE HCL 1 MG/ML IJ SOLN
0.5000 mg | Freq: Once | INTRAMUSCULAR | Status: DC
Start: 2016-02-10 — End: 2016-02-10
  Filled 2016-02-10: qty 1

## 2016-02-10 MED ORDER — ONDANSETRON HCL 4 MG PO TABS: 4.0000 mg | ORAL_TABLET | Freq: Four times a day (QID) | ORAL | 0 refills | Status: DC

## 2016-02-10 MED ORDER — HYDROMORPHONE HCL 1 MG/ML IJ SOLN
1.0000 mg | Freq: Once | INTRAMUSCULAR | Status: AC
  Administered 2016-02-10: 1 mg via INTRAVENOUS
  Filled 2016-02-10: qty 1

## 2016-02-10 NOTE — ED Provider Notes (Signed)
[place] DEPT Provider Note   CSN: [id number] Arrival date & time: 02/10/16  1731     History   Chief Complaint No chief complaint on file.   HPI Casey Mason is a 50 y.o. female.  HPI Casey Mason is a 50 y.o. female with PMH significant for ureteral stones who presents with sudden onset, constant, severe right flank pain.  Patient had a retained R lower ureteral stone and R lower pole stone s/p lithotripsy and underwent a cystourethroscopy, with dilation of right ureterovesical junction stricture, and J stent placement 02/07/16.  She was instructed to remove the stent today and follow up with Dr. Gaynelle Arabian this coming Friday.  She states she was doing fine until about an hour after she removed her stent today and has been in severe pain.  She has taken her hydrocodone without relief.  She reports some nausea and intermittent vomiting today.  She reports hematuria and dysuria since the procedure.  Nothing makes her symptoms better.   Past Medical History:  Diagnosis Date  . Diverticulosis of colon   . Family history of adverse reaction to anesthesia    mother-- ponv  . History of colon polyps    2016- hyperplastic  . History of diverticulitis of colon   . Nephrolithiasis    right  . PONV (postoperative nausea and vomiting)   . Right ureteral stone     There are no active problems to display for this patient.   Past Surgical History:  Procedure Laterality Date  . CESAREAN SECTION  1988  . COLONOSCOPY  11-10-2014  in [place]  . CYSTOSCOPY WITH RETROGRADE PYELOGRAM, URETEROSCOPY AND STENT PLACEMENT Right 02/07/2016   Procedure: CYSTOSCOPY WITH RIGHT PYLEOGRAM  RIGHT URETEROSCOPY, , DIALATION OF RIGHT URETER STRICUTURE FLEXIBLE RIGHT URETEROSCOPY, RIGHT PYELOSCOPY,  AND RIGHT DOUBLE STENT PLACEMENT WITH TETHER;  Surgeon: Carolan Clines, MD;  Location: [facility];  Service: Urology;  Laterality: Right;  . EXTRACORPOREAL SHOCK WAVE LITHOTRIPSY Right  09/20/2015  . ROTATOR CUFF REPAIR Right 2013 or 2014??  . TONSILLECTOMY  2003  . TUBAL LIGATION  1992  . VAGINAL HYSTERECTOMY  2005    OB History    No data available       Home Medications    Prior to Admission medications   Medication Sig Start Date End Date Taking? Authorizing Provider  aspirin 81 MG tablet Take 81 mg by mouth daily.   Yes Historical Provider, MD  Cholecalciferol (VITAMIN D3) 2000 units TABS Take 1 tablet by mouth daily.   Yes Historical Provider, MD  Evening Primrose Oil 1000 MG CAPS Take 1 capsule by mouth 2 (two) times daily.   Yes Historical Provider, MD  Hydrocodone-Acetaminophen (VICODIN) 5-300 MG TABS Take 5 mg by mouth every 6 (six) hours as needed. 02/07/16 02/11/16 Yes Sigmund Gaynelle Arabian, MD  Hyoscyamine Sulfate SL (LEVSIN/SL) 0.125 MG SUBL Place 0.125 mg under the tongue 3 (three) times daily between meals as needed (for spasms). 02/07/16  Yes Carolan Clines, MD  MISC NATURAL PRODUCTS PO Take by mouth. EstroG-100 1 capsule BID   Yes Historical Provider, MD  MISC NATURAL PRODUCTS PO Take by mouth. Dr. Guy Begin intestinal formula 2 capsules daily   Yes Historical Provider, MD  Multiple Vitamin (MULTIVITAMIN) tablet Take 1 tablet by mouth daily.   Yes Historical Provider, MD  naproxen sodium (ANAPROX) 220 MG tablet Take 220 mg by mouth daily as needed (pain).    Yes Historical Provider, MD  phenazopyridine (PYRIDIUM) 200 MG  tablet Take 1 tablet (200 mg total) by mouth 3 (three) times daily as needed for pain. For urinary burning Note: will stain clothing 02/07/16 02/14/16 Yes Carolan Clines, MD  polyethylene glycol powder (MIRALAX) powder Take 1 Container by mouth 2 (two) times daily as needed for mild constipation or moderate constipation.    Yes Historical Provider, MD  tamsulosin (FLOMAX) 0.4 MG CAPS capsule Take 0.4 mg by mouth daily after supper.   Yes Historical Provider, MD  ondansetron (ZOFRAN) 4 MG tablet Take 1 tablet (4 mg total) by mouth  every 6 (six) hours. 02/10/16   Gloriann Loan, PA-C    Family History Family History  Problem Relation Age of Onset  . Anemia Mother     Social History Social History  Substance Use Topics  . Smoking status: Current Every Day Smoker    Packs/day: 0.50    Years: 15.00    Types: Cigarettes  . Smokeless tobacco: Never Used  . Alcohol use Yes     Comment: occasional     Allergies   Oxycodone and Sulfa antibiotics   Review of Systems Review of Systems All other systems negative unless otherwise stated in HPI   Physical Exam Updated Vital Signs BP 138/87 (BP Location: Left Arm)   Pulse 78   Temp 97.7 F (36.5 C) (Oral)   Resp 20   SpO2 96%   Physical Exam  Constitutional: She is oriented to person, place, and time. She appears well-developed and well-nourished.  Non-toxic appearance. She does not have a sickly appearance. She does not appear ill.  Appears uncomfortable.   HENT:  Head: Normocephalic and atraumatic.  Mouth/Throat: Oropharynx is clear and moist.  Eyes: Conjunctivae are normal.  Neck: Normal range of motion. Neck supple.  Cardiovascular: Normal rate and regular rhythm.   Pulmonary/Chest: Effort normal and breath sounds normal. No accessory muscle usage or stridor. No respiratory distress. She has no wheezes. She has no rhonchi. She has no rales.  Abdominal: Soft. Bowel sounds are normal. She exhibits no distension. There is no tenderness. There is no guarding.  Musculoskeletal: Normal range of motion.  Lymphadenopathy:    She has no cervical adenopathy.  Neurological: She is alert and oriented to person, place, and time.  Speech clear without dysarthria.  Skin: Skin is warm and dry.  Psychiatric: She has a normal mood and affect. Her behavior is normal.     ED Treatments / Results  Labs (all labs ordered are listed, but only abnormal results are displayed) Labs Reviewed  URINALYSIS, ROUTINE W REFLEX MICROSCOPIC (NOT AT [facility]) - Abnormal; Notable for  the following:       Result Value   Color, Urine ORANGE (*)    Hgb urine dipstick SMALL (*)    Nitrite POSITIVE (*)    All other components within normal limits  URINE MICROSCOPIC-ADD ON - Abnormal; Notable for the following:    Squamous Epithelial / LPF 0-5 (*)    All other components within normal limits    EKG  EKG Interpretation None       Radiology No results found.  Procedures Procedures (including critical care time)  Medications Ordered in ED Medications  HYDROmorphone (DILAUDID) injection 1 mg (1 mg Intravenous Given 02/10/16 1806)  ondansetron (ZOFRAN) injection 4 mg (4 mg Intravenous Given 02/10/16 1825)  LORazepam (ATIVAN) injection 0.5 mg (0.5 mg Intravenous Given 02/10/16 1912)  HYDROmorphone (DILAUDID) injection 1 mg (1 mg Intravenous Given 02/10/16 1906)  HYDROmorphone (DILAUDID) injection 0.5 mg (0.5  mg Intravenous Given 02/10/16 2029)  ondansetron [facility]) injection 4 mg (4 mg Intravenous Given 02/10/16 2026)  HYDROcodone-acetaminophen (NORCO/VICODIN) 5-325 MG per tablet 2 tablet (2 tablets Oral Given 02/10/16 2058)     Initial Impression / Assessment and Plan / ED Course  I have reviewed the triage vital signs and the nursing notes.  Pertinent labs & imaging results that were available during my care of the patient were reviewed by me and considered in my medical decision making (see chart for details).  Clinical Course    Patient presents with sudden onset severe right flank pain after removing her J stent as instructed by her urologist, Dr. Gaynelle Arabian.  Will check UA and provide pain control.   UA with positive nitrite, but no bacteria or WBCs on microscopic.  Suspect this is a false positive secondary to pyridium.  Will continue to control pain.   Pain with some relief, states pain 7.5/10.  Continued nausea, now with vomiting.   Pain controlled.  Slightly nauseated, likely secondary to pain medicine.  Able to tolerate PO.  Home with Zofran.   Continue 2 tablets norco as prescribed by her urologist.  She will call in the morning.  Return precautions discussed.  Stable for discharge.   Final Clinical Impressions(s) / ED Diagnoses   Final diagnoses:  Right flank pain    New Prescriptions New Prescriptions   ONDANSETRON (ZOFRAN) 4 MG TABLET    Take 1 tablet (4 mg total) by mouth every 6 (six) hours.     Gloriann Loan, PA-C 02/10/16 2102    Milton Ferguson, MD 02/10/16 [phone]

## 2016-02-10 NOTE — ED Triage Notes (Signed)
Pt had R sided stent placed on Thursday and it was removed this morning. Pt c/o R sided flank pain since removal. Pt tried the pain medication that she was prescribed and called her doctor, but has been unable to get the pain under control. She arrives today under the recommendation of her Dr. Reola Mosher. Ambulatory.

## 2016-02-10 NOTE — Discharge Instructions (Signed)
Continue taking Hydrocodone as instructed by your urologist.  Call them in the in the morning.  Take zofran every 6 hours for nausea and vomiting.  Return to the ED for severe pain, persistent vomiting, fever, or any new or concerning symptoms.

## 2016-02-10 NOTE — ED Notes (Signed)
PA at bedside.

## 2016-06-19 DIAGNOSIS — H524 Presbyopia: Secondary | ICD-10-CM | POA: Diagnosis not present

## 2016-07-02 DIAGNOSIS — M79672 Pain in left foot: Secondary | ICD-10-CM | POA: Diagnosis not present

## 2016-07-02 DIAGNOSIS — G5762 Lesion of plantar nerve, left lower limb: Secondary | ICD-10-CM | POA: Diagnosis not present

## 2016-07-02 DIAGNOSIS — M79671 Pain in right foot: Secondary | ICD-10-CM | POA: Diagnosis not present

## 2016-07-02 DIAGNOSIS — G5761 Lesion of plantar nerve, right lower limb: Secondary | ICD-10-CM | POA: Diagnosis not present

## 2016-07-15 DIAGNOSIS — M79671 Pain in right foot: Secondary | ICD-10-CM | POA: Diagnosis not present

## 2016-07-15 DIAGNOSIS — G5762 Lesion of plantar nerve, left lower limb: Secondary | ICD-10-CM | POA: Diagnosis not present

## 2016-07-15 DIAGNOSIS — M79672 Pain in left foot: Secondary | ICD-10-CM | POA: Diagnosis not present

## 2016-07-15 DIAGNOSIS — M722 Plantar fascial fibromatosis: Secondary | ICD-10-CM | POA: Diagnosis not present

## 2016-08-15 DIAGNOSIS — Z08 Encounter for follow-up examination after completed treatment for malignant neoplasm: Secondary | ICD-10-CM | POA: Diagnosis not present

## 2016-08-15 DIAGNOSIS — Z853 Personal history of malignant neoplasm of breast: Secondary | ICD-10-CM | POA: Diagnosis not present

## 2016-08-15 DIAGNOSIS — N6001 Solitary cyst of right breast: Secondary | ICD-10-CM | POA: Diagnosis not present

## 2016-10-08 DIAGNOSIS — M25571 Pain in right ankle and joints of right foot: Secondary | ICD-10-CM | POA: Diagnosis not present

## 2016-11-05 DIAGNOSIS — M25571 Pain in right ankle and joints of right foot: Secondary | ICD-10-CM | POA: Diagnosis not present

## 2017-01-02 DIAGNOSIS — Z01419 Encounter for gynecological examination (general) (routine) without abnormal findings: Secondary | ICD-10-CM | POA: Diagnosis not present

## 2017-01-07 DIAGNOSIS — Z13220 Encounter for screening for lipoid disorders: Secondary | ICD-10-CM | POA: Diagnosis not present

## 2017-01-07 DIAGNOSIS — E559 Vitamin D deficiency, unspecified: Secondary | ICD-10-CM | POA: Diagnosis not present

## 2017-01-07 DIAGNOSIS — R5383 Other fatigue: Secondary | ICD-10-CM | POA: Diagnosis not present

## 2017-01-12 DIAGNOSIS — Z23 Encounter for immunization: Secondary | ICD-10-CM | POA: Diagnosis not present

## 2017-01-12 DIAGNOSIS — Z0000 Encounter for general adult medical examination without abnormal findings: Secondary | ICD-10-CM | POA: Diagnosis not present

## 2017-01-19 DIAGNOSIS — L570 Actinic keratosis: Secondary | ICD-10-CM | POA: Diagnosis not present

## 2017-01-19 DIAGNOSIS — L299 Pruritus, unspecified: Secondary | ICD-10-CM | POA: Diagnosis not present

## 2017-01-19 DIAGNOSIS — L821 Other seborrheic keratosis: Secondary | ICD-10-CM | POA: Diagnosis not present

## 2017-01-19 DIAGNOSIS — D229 Melanocytic nevi, unspecified: Secondary | ICD-10-CM | POA: Diagnosis not present

## 2017-02-16 DIAGNOSIS — Z08 Encounter for follow-up examination after completed treatment for malignant neoplasm: Secondary | ICD-10-CM | POA: Diagnosis not present

## 2017-02-16 DIAGNOSIS — R928 Other abnormal and inconclusive findings on diagnostic imaging of breast: Secondary | ICD-10-CM | POA: Diagnosis not present

## 2017-02-16 DIAGNOSIS — Z853 Personal history of malignant neoplasm of breast: Secondary | ICD-10-CM | POA: Diagnosis not present

## 2017-02-16 DIAGNOSIS — N6011 Diffuse cystic mastopathy of right breast: Secondary | ICD-10-CM | POA: Diagnosis not present

## 2017-03-06 DIAGNOSIS — N200 Calculus of kidney: Secondary | ICD-10-CM | POA: Diagnosis not present

## 2017-03-13 DIAGNOSIS — M542 Cervicalgia: Secondary | ICD-10-CM | POA: Diagnosis not present

## 2017-03-13 DIAGNOSIS — M25511 Pain in right shoulder: Secondary | ICD-10-CM | POA: Diagnosis not present

## 2017-04-08 DIAGNOSIS — M9903 Segmental and somatic dysfunction of lumbar region: Secondary | ICD-10-CM | POA: Diagnosis not present

## 2017-04-08 DIAGNOSIS — M5033 Other cervical disc degeneration, cervicothoracic region: Secondary | ICD-10-CM | POA: Diagnosis not present

## 2017-04-08 DIAGNOSIS — M9901 Segmental and somatic dysfunction of cervical region: Secondary | ICD-10-CM | POA: Diagnosis not present

## 2017-04-13 DIAGNOSIS — M9901 Segmental and somatic dysfunction of cervical region: Secondary | ICD-10-CM | POA: Diagnosis not present

## 2017-04-13 DIAGNOSIS — M9903 Segmental and somatic dysfunction of lumbar region: Secondary | ICD-10-CM | POA: Diagnosis not present

## 2017-04-13 DIAGNOSIS — M5033 Other cervical disc degeneration, cervicothoracic region: Secondary | ICD-10-CM | POA: Diagnosis not present

## 2017-04-15 DIAGNOSIS — M9903 Segmental and somatic dysfunction of lumbar region: Secondary | ICD-10-CM | POA: Diagnosis not present

## 2017-04-15 DIAGNOSIS — M5033 Other cervical disc degeneration, cervicothoracic region: Secondary | ICD-10-CM | POA: Diagnosis not present

## 2017-04-15 DIAGNOSIS — M9901 Segmental and somatic dysfunction of cervical region: Secondary | ICD-10-CM | POA: Diagnosis not present

## 2017-04-16 DIAGNOSIS — M9901 Segmental and somatic dysfunction of cervical region: Secondary | ICD-10-CM | POA: Diagnosis not present

## 2017-04-16 DIAGNOSIS — M5033 Other cervical disc degeneration, cervicothoracic region: Secondary | ICD-10-CM | POA: Diagnosis not present

## 2017-04-16 DIAGNOSIS — M9903 Segmental and somatic dysfunction of lumbar region: Secondary | ICD-10-CM | POA: Diagnosis not present

## 2017-04-20 DIAGNOSIS — M5033 Other cervical disc degeneration, cervicothoracic region: Secondary | ICD-10-CM | POA: Diagnosis not present

## 2017-04-20 DIAGNOSIS — M9901 Segmental and somatic dysfunction of cervical region: Secondary | ICD-10-CM | POA: Diagnosis not present

## 2017-04-20 DIAGNOSIS — M9903 Segmental and somatic dysfunction of lumbar region: Secondary | ICD-10-CM | POA: Diagnosis not present

## 2017-04-22 DIAGNOSIS — M9901 Segmental and somatic dysfunction of cervical region: Secondary | ICD-10-CM | POA: Diagnosis not present

## 2017-04-22 DIAGNOSIS — M5033 Other cervical disc degeneration, cervicothoracic region: Secondary | ICD-10-CM | POA: Diagnosis not present

## 2017-04-22 DIAGNOSIS — M9903 Segmental and somatic dysfunction of lumbar region: Secondary | ICD-10-CM | POA: Diagnosis not present

## 2017-04-27 DIAGNOSIS — M9901 Segmental and somatic dysfunction of cervical region: Secondary | ICD-10-CM | POA: Diagnosis not present

## 2017-04-27 DIAGNOSIS — M9903 Segmental and somatic dysfunction of lumbar region: Secondary | ICD-10-CM | POA: Diagnosis not present

## 2017-04-27 DIAGNOSIS — M5033 Other cervical disc degeneration, cervicothoracic region: Secondary | ICD-10-CM | POA: Diagnosis not present

## 2017-04-29 DIAGNOSIS — M9901 Segmental and somatic dysfunction of cervical region: Secondary | ICD-10-CM | POA: Diagnosis not present

## 2017-04-29 DIAGNOSIS — M5033 Other cervical disc degeneration, cervicothoracic region: Secondary | ICD-10-CM | POA: Diagnosis not present

## 2017-04-29 DIAGNOSIS — M9903 Segmental and somatic dysfunction of lumbar region: Secondary | ICD-10-CM | POA: Diagnosis not present

## 2017-04-30 DIAGNOSIS — M9901 Segmental and somatic dysfunction of cervical region: Secondary | ICD-10-CM | POA: Diagnosis not present

## 2017-04-30 DIAGNOSIS — M5033 Other cervical disc degeneration, cervicothoracic region: Secondary | ICD-10-CM | POA: Diagnosis not present

## 2017-04-30 DIAGNOSIS — M9903 Segmental and somatic dysfunction of lumbar region: Secondary | ICD-10-CM | POA: Diagnosis not present

## 2017-05-04 DIAGNOSIS — M5033 Other cervical disc degeneration, cervicothoracic region: Secondary | ICD-10-CM | POA: Diagnosis not present

## 2017-05-04 DIAGNOSIS — M9901 Segmental and somatic dysfunction of cervical region: Secondary | ICD-10-CM | POA: Diagnosis not present

## 2017-05-04 DIAGNOSIS — M9903 Segmental and somatic dysfunction of lumbar region: Secondary | ICD-10-CM | POA: Diagnosis not present

## 2017-05-06 DIAGNOSIS — M9903 Segmental and somatic dysfunction of lumbar region: Secondary | ICD-10-CM | POA: Diagnosis not present

## 2017-05-06 DIAGNOSIS — M9901 Segmental and somatic dysfunction of cervical region: Secondary | ICD-10-CM | POA: Diagnosis not present

## 2017-05-06 DIAGNOSIS — M5033 Other cervical disc degeneration, cervicothoracic region: Secondary | ICD-10-CM | POA: Diagnosis not present

## 2017-05-07 DIAGNOSIS — M5033 Other cervical disc degeneration, cervicothoracic region: Secondary | ICD-10-CM | POA: Diagnosis not present

## 2017-05-07 DIAGNOSIS — M9903 Segmental and somatic dysfunction of lumbar region: Secondary | ICD-10-CM | POA: Diagnosis not present

## 2017-05-07 DIAGNOSIS — M9901 Segmental and somatic dysfunction of cervical region: Secondary | ICD-10-CM | POA: Diagnosis not present

## 2017-05-11 DIAGNOSIS — M9903 Segmental and somatic dysfunction of lumbar region: Secondary | ICD-10-CM | POA: Diagnosis not present

## 2017-05-11 DIAGNOSIS — M5033 Other cervical disc degeneration, cervicothoracic region: Secondary | ICD-10-CM | POA: Diagnosis not present

## 2017-05-11 DIAGNOSIS — M9901 Segmental and somatic dysfunction of cervical region: Secondary | ICD-10-CM | POA: Diagnosis not present

## 2017-05-13 DIAGNOSIS — M9903 Segmental and somatic dysfunction of lumbar region: Secondary | ICD-10-CM | POA: Diagnosis not present

## 2017-05-13 DIAGNOSIS — M5033 Other cervical disc degeneration, cervicothoracic region: Secondary | ICD-10-CM | POA: Diagnosis not present

## 2017-05-13 DIAGNOSIS — M9901 Segmental and somatic dysfunction of cervical region: Secondary | ICD-10-CM | POA: Diagnosis not present

## 2017-05-18 DIAGNOSIS — M9901 Segmental and somatic dysfunction of cervical region: Secondary | ICD-10-CM | POA: Diagnosis not present

## 2017-05-18 DIAGNOSIS — M9903 Segmental and somatic dysfunction of lumbar region: Secondary | ICD-10-CM | POA: Diagnosis not present

## 2017-05-18 DIAGNOSIS — M5033 Other cervical disc degeneration, cervicothoracic region: Secondary | ICD-10-CM | POA: Diagnosis not present

## 2017-05-20 DIAGNOSIS — M5033 Other cervical disc degeneration, cervicothoracic region: Secondary | ICD-10-CM | POA: Diagnosis not present

## 2017-05-20 DIAGNOSIS — M9901 Segmental and somatic dysfunction of cervical region: Secondary | ICD-10-CM | POA: Diagnosis not present

## 2017-05-20 DIAGNOSIS — M9903 Segmental and somatic dysfunction of lumbar region: Secondary | ICD-10-CM | POA: Diagnosis not present

## 2017-05-21 DIAGNOSIS — M5033 Other cervical disc degeneration, cervicothoracic region: Secondary | ICD-10-CM | POA: Diagnosis not present

## 2017-05-21 DIAGNOSIS — M9903 Segmental and somatic dysfunction of lumbar region: Secondary | ICD-10-CM | POA: Diagnosis not present

## 2017-05-21 DIAGNOSIS — M9901 Segmental and somatic dysfunction of cervical region: Secondary | ICD-10-CM | POA: Diagnosis not present

## 2017-05-25 DIAGNOSIS — M5033 Other cervical disc degeneration, cervicothoracic region: Secondary | ICD-10-CM | POA: Diagnosis not present

## 2017-05-25 DIAGNOSIS — M9903 Segmental and somatic dysfunction of lumbar region: Secondary | ICD-10-CM | POA: Diagnosis not present

## 2017-05-25 DIAGNOSIS — M9901 Segmental and somatic dysfunction of cervical region: Secondary | ICD-10-CM | POA: Diagnosis not present

## 2017-05-27 DIAGNOSIS — M5033 Other cervical disc degeneration, cervicothoracic region: Secondary | ICD-10-CM | POA: Diagnosis not present

## 2017-05-27 DIAGNOSIS — M9901 Segmental and somatic dysfunction of cervical region: Secondary | ICD-10-CM | POA: Diagnosis not present

## 2017-05-27 DIAGNOSIS — M9903 Segmental and somatic dysfunction of lumbar region: Secondary | ICD-10-CM | POA: Diagnosis not present

## 2017-05-27 DIAGNOSIS — L821 Other seborrheic keratosis: Secondary | ICD-10-CM | POA: Diagnosis not present

## 2017-05-27 DIAGNOSIS — L820 Inflamed seborrheic keratosis: Secondary | ICD-10-CM | POA: Diagnosis not present

## 2017-05-27 DIAGNOSIS — L720 Epidermal cyst: Secondary | ICD-10-CM | POA: Diagnosis not present

## 2017-06-01 DIAGNOSIS — M9903 Segmental and somatic dysfunction of lumbar region: Secondary | ICD-10-CM | POA: Diagnosis not present

## 2017-06-01 DIAGNOSIS — M9901 Segmental and somatic dysfunction of cervical region: Secondary | ICD-10-CM | POA: Diagnosis not present

## 2017-06-01 DIAGNOSIS — M5033 Other cervical disc degeneration, cervicothoracic region: Secondary | ICD-10-CM | POA: Diagnosis not present

## 2017-06-03 DIAGNOSIS — M9901 Segmental and somatic dysfunction of cervical region: Secondary | ICD-10-CM | POA: Diagnosis not present

## 2017-06-03 DIAGNOSIS — M5033 Other cervical disc degeneration, cervicothoracic region: Secondary | ICD-10-CM | POA: Diagnosis not present

## 2017-06-03 DIAGNOSIS — M9903 Segmental and somatic dysfunction of lumbar region: Secondary | ICD-10-CM | POA: Diagnosis not present

## 2017-06-04 DIAGNOSIS — M9901 Segmental and somatic dysfunction of cervical region: Secondary | ICD-10-CM | POA: Diagnosis not present

## 2017-06-04 DIAGNOSIS — M9903 Segmental and somatic dysfunction of lumbar region: Secondary | ICD-10-CM | POA: Diagnosis not present

## 2017-06-04 DIAGNOSIS — M5033 Other cervical disc degeneration, cervicothoracic region: Secondary | ICD-10-CM | POA: Diagnosis not present

## 2017-06-08 DIAGNOSIS — M5033 Other cervical disc degeneration, cervicothoracic region: Secondary | ICD-10-CM | POA: Diagnosis not present

## 2017-06-08 DIAGNOSIS — M9903 Segmental and somatic dysfunction of lumbar region: Secondary | ICD-10-CM | POA: Diagnosis not present

## 2017-06-08 DIAGNOSIS — M9901 Segmental and somatic dysfunction of cervical region: Secondary | ICD-10-CM | POA: Diagnosis not present

## 2017-06-11 DIAGNOSIS — M5033 Other cervical disc degeneration, cervicothoracic region: Secondary | ICD-10-CM | POA: Diagnosis not present

## 2017-06-11 DIAGNOSIS — M9903 Segmental and somatic dysfunction of lumbar region: Secondary | ICD-10-CM | POA: Diagnosis not present

## 2017-06-11 DIAGNOSIS — M9901 Segmental and somatic dysfunction of cervical region: Secondary | ICD-10-CM | POA: Diagnosis not present

## 2017-06-25 DIAGNOSIS — M9901 Segmental and somatic dysfunction of cervical region: Secondary | ICD-10-CM | POA: Diagnosis not present

## 2017-06-25 DIAGNOSIS — M9903 Segmental and somatic dysfunction of lumbar region: Secondary | ICD-10-CM | POA: Diagnosis not present

## 2017-06-25 DIAGNOSIS — M5033 Other cervical disc degeneration, cervicothoracic region: Secondary | ICD-10-CM | POA: Diagnosis not present

## 2017-06-26 ENCOUNTER — Ambulatory Visit: Payer: 59 | Admitting: Physician Assistant

## 2017-06-27 DIAGNOSIS — Z0100 Encounter for examination of eyes and vision without abnormal findings: Secondary | ICD-10-CM | POA: Diagnosis not present

## 2017-06-27 IMAGING — DX DG ABDOMEN 1V
1 series · 1 of 1 positions shown · non-contrast
Comparison: CT scan 07/31/2015.

CLINICAL DATA: Preop for lithotripsy

EXAM:
ABDOMEN - 1 VIEW

[abdomen kub]
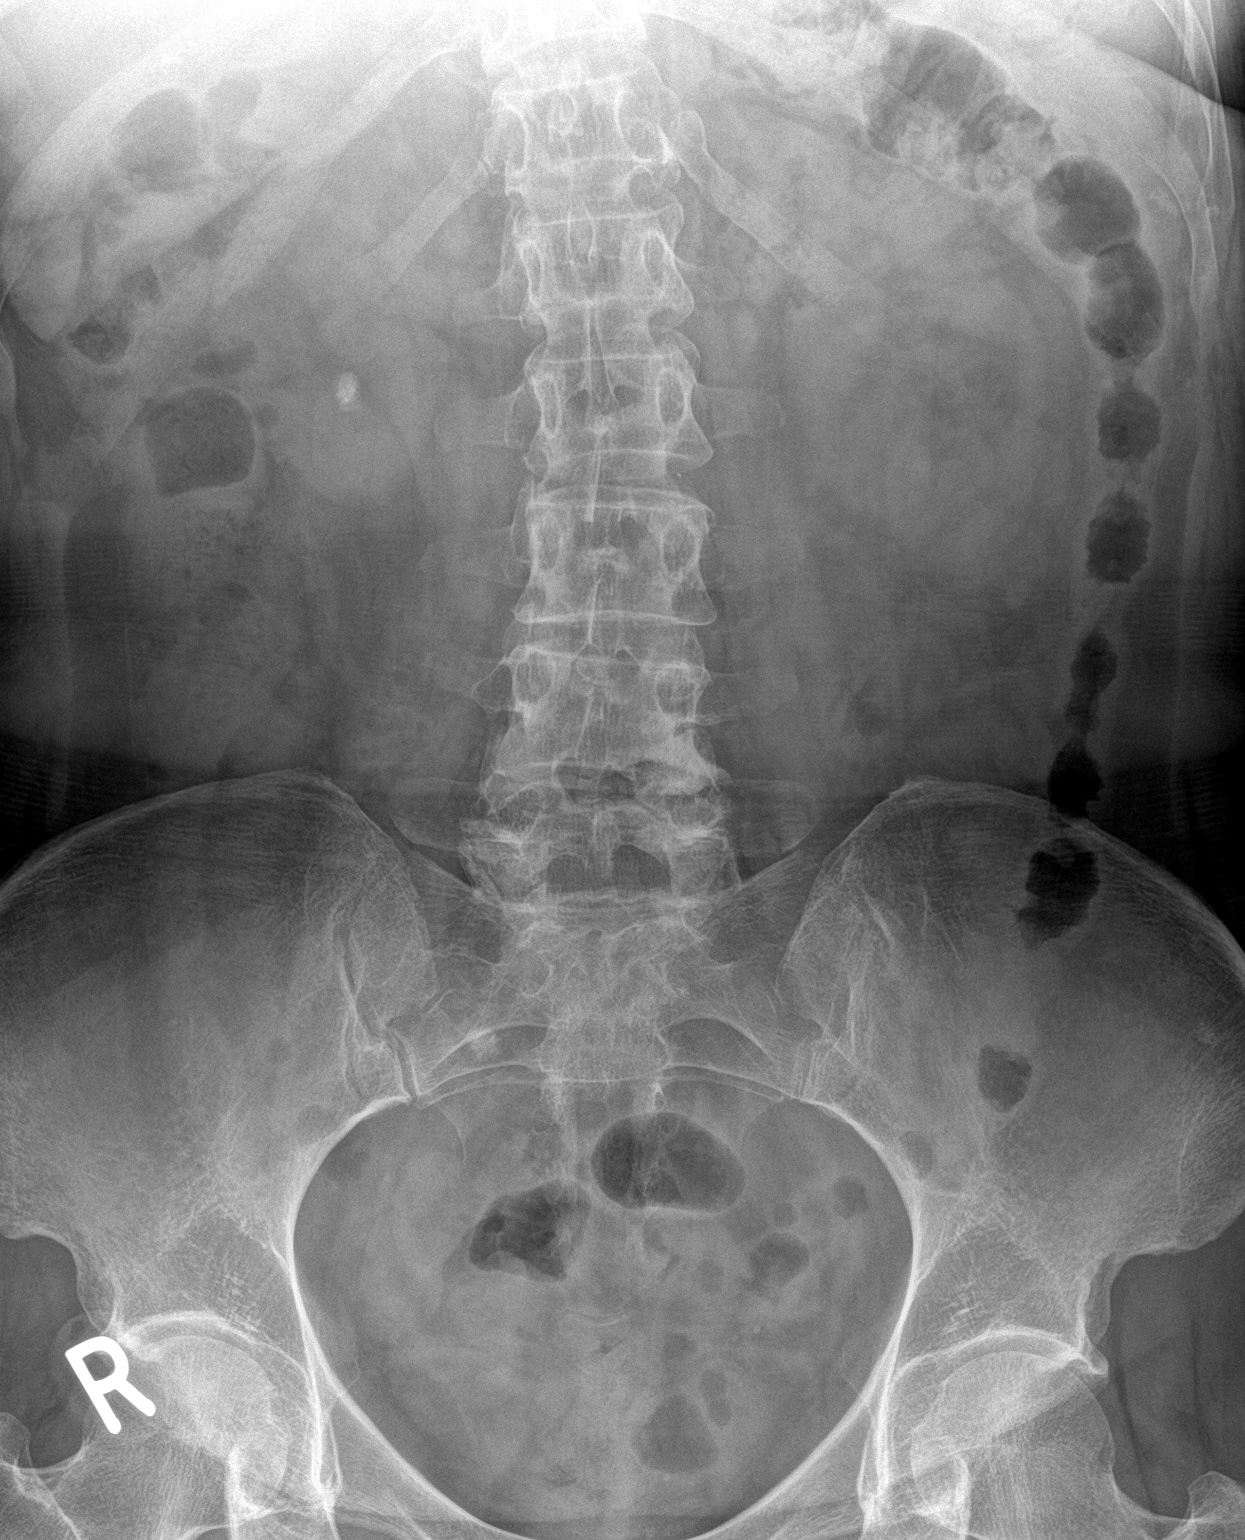

[1 of 1 positions shown; findings below may reference images not displayed]

FINDINGS: There is normal small bowel gas pattern. Calcified calculus in lower
pole region of the right kidney measures 9.3 mm. No ureteral calculi
are noted.
IMPRESSION: Calcified calculus in lower pole region of the right kidney measures
9.3 mm.

## 2017-06-29 DIAGNOSIS — M5033 Other cervical disc degeneration, cervicothoracic region: Secondary | ICD-10-CM | POA: Diagnosis not present

## 2017-06-29 DIAGNOSIS — M9901 Segmental and somatic dysfunction of cervical region: Secondary | ICD-10-CM | POA: Diagnosis not present

## 2017-06-29 DIAGNOSIS — M9903 Segmental and somatic dysfunction of lumbar region: Secondary | ICD-10-CM | POA: Diagnosis not present

## 2017-07-01 DIAGNOSIS — M9901 Segmental and somatic dysfunction of cervical region: Secondary | ICD-10-CM | POA: Diagnosis not present

## 2017-07-01 DIAGNOSIS — M5033 Other cervical disc degeneration, cervicothoracic region: Secondary | ICD-10-CM | POA: Diagnosis not present

## 2017-07-01 DIAGNOSIS — M9903 Segmental and somatic dysfunction of lumbar region: Secondary | ICD-10-CM | POA: Diagnosis not present

## 2017-07-06 DIAGNOSIS — M5033 Other cervical disc degeneration, cervicothoracic region: Secondary | ICD-10-CM | POA: Diagnosis not present

## 2017-07-06 DIAGNOSIS — M9903 Segmental and somatic dysfunction of lumbar region: Secondary | ICD-10-CM | POA: Diagnosis not present

## 2017-07-06 DIAGNOSIS — M9901 Segmental and somatic dysfunction of cervical region: Secondary | ICD-10-CM | POA: Diagnosis not present

## 2017-07-07 ENCOUNTER — Encounter (INDEPENDENT_AMBULATORY_CARE_PROVIDER_SITE_OTHER): Payer: Self-pay

## 2017-07-07 ENCOUNTER — Ambulatory Visit: Payer: 59 | Admitting: Gastroenterology

## 2017-07-07 ENCOUNTER — Encounter: Payer: Self-pay | Admitting: Gastroenterology

## 2017-07-07 ENCOUNTER — Telehealth: Payer: Self-pay | Admitting: Gastroenterology

## 2017-07-07 VITALS — BP 124/68 | HR 70 | Ht 67.0 in | Wt 185.0 lb

## 2017-07-07 DIAGNOSIS — R110 Nausea: Secondary | ICD-10-CM | POA: Diagnosis not present

## 2017-07-07 DIAGNOSIS — K5909 Other constipation: Secondary | ICD-10-CM

## 2017-07-07 DIAGNOSIS — R1032 Left lower quadrant pain: Secondary | ICD-10-CM

## 2017-07-07 DIAGNOSIS — K5732 Diverticulitis of large intestine without perforation or abscess without bleeding: Secondary | ICD-10-CM | POA: Diagnosis not present

## 2017-07-07 DIAGNOSIS — R140 Abdominal distension (gaseous): Secondary | ICD-10-CM

## 2017-07-07 DIAGNOSIS — R6881 Early satiety: Secondary | ICD-10-CM | POA: Diagnosis not present

## 2017-07-07 MED ORDER — CIPROFLOXACIN HCL 500 MG PO TABS: 500.0000 mg | ORAL_TABLET | Freq: Two times a day (BID) | ORAL | 0 refills | Status: AC

## 2017-07-07 MED ORDER — HYDROCODONE-ACETAMINOPHEN 5-325 MG PO TABS: 1.0000 | ORAL_TABLET | Freq: Four times a day (QID) | ORAL | 0 refills | Status: DC | PRN

## 2017-07-07 MED ORDER — METRONIDAZOLE 500 MG PO TABS: 500.0000 mg | ORAL_TABLET | Freq: Three times a day (TID) | ORAL | 0 refills | Status: AC

## 2017-07-07 MED ORDER — HYDROCODONE-ACETAMINOPHEN 5-300 MG PO TABS: 1.0000 | ORAL_TABLET | Freq: Four times a day (QID) | ORAL | 0 refills | Status: DC | PRN

## 2017-07-07 NOTE — Telephone Encounter (Signed)
Patient will come in and see Dr. Loletha Carrow today

## 2017-07-07 NOTE — Progress Notes (Signed)
[facility] Progress Note  Chief Complaint: Left lower quadrant pain  Subjective  History:  This is a 52 year old woman seen as a sick visit today.  She scheduled the appointment about a month ago, but regular provider was unavailable. Casey Mason originally made the appointment for feelings of upper abdominal bloating early satiety and frequent nausea that  often occur in the afternoon, precluding supper meal.  She feels as if she may vomit, but typically does not.  She lost about 10 pounds from sometime last year to a few months ago, but has been stable since then.  The symptoms were going on for about a week and a half prior to her call to schedule this appointment, so altogether about 6 weeks now.  She has tended toward constipation for many years, but at this point is going every day without straining and is using an herbal supplement that she showed me and contains Senokot. For the last 2 days she has had left lower quadrant pain that is steadily worsening in intensity, it feels sharp and is nonradiating.  She feels that it is reminiscent of a previous episode of diverticulitis.  There is been no black or bloody stool.  She was seen by Dr. Fuller Plan in August 2017, complaining of constipation and a recent episode of diverticulitis.  His note indicates a colonoscopy had been done in August 2016 with only hyperplastic polyp, but limitation of diffuse melanosis coli.  She was advised to increase MiraLAX dosing and call in 1 month with an update of symptoms.  This appears to be the last time we saw her prior to today.  ROS: Cardiovascular:  no chest pain Respiratory: no dyspnea No fever or chills  Remainder of systems negative except as above Past Medical History:  Diagnosis Date  . Diverticulosis of colon   . Family history of adverse reaction to anesthesia    mother-- ponv  . History of colon polyps    2016- hyperplastic  . History of diverticulitis of colon   . Nephrolithiasis    right  . PONV (postoperative nausea and vomiting)   . Right ureteral stone    Past Surgical History:  Procedure Laterality Date  . CESAREAN SECTION  1988  . COLONOSCOPY  11-10-2014  in [place]  . CYSTOSCOPY WITH RETROGRADE PYELOGRAM, URETEROSCOPY AND STENT PLACEMENT Right 02/07/2016   Procedure: CYSTOSCOPY WITH RIGHT PYLEOGRAM  RIGHT URETEROSCOPY, , DIALATION OF RIGHT URETER STRICUTURE FLEXIBLE RIGHT URETEROSCOPY, RIGHT PYELOSCOPY,  AND RIGHT DOUBLE STENT PLACEMENT WITH TETHER;  Surgeon: Carolan Clines, MD;  Location: [facility];  Service: Urology;  Laterality: Right;  . EXTRACORPOREAL SHOCK WAVE LITHOTRIPSY Right 09/20/2015  . ROTATOR CUFF REPAIR Right 2013 or 2014??  . TONSILLECTOMY  2003  . TUBAL LIGATION  1992  . VAGINAL HYSTERECTOMY  2005   Family History  Problem Relation Age of Onset  . Anemia Mother     The patient's Past Medical, Family and Social History were reviewed and are on file in the EMR.  Objective:  Med list reviewed  Current Outpatient Medications:  .  Apple Cider Vinegar 500 MG TABS, Take by mouth., Disp: , Rfl:  .  aspirin 81 MG tablet, Take 81 mg by mouth daily., Disp: , Rfl:  .  Cholecalciferol (VITAMIN D3) 2000 units TABS, Take 1 tablet by mouth daily., Disp: , Rfl:  .  MISC NATURAL PRODUCTS PO, Take by mouth. EstroG-100 1 capsule BID, Disp: , Rfl:  .  MISC NATURAL  PRODUCTS PO, Take by mouth. Dr. Guy Begin intestinal formula 2 capsules daily, Disp: , Rfl:  .  Multiple Vitamin (MULTIVITAMIN) tablet, Take 1 tablet by mouth daily., Disp: , Rfl:  .  naproxen sodium (ANAPROX) 220 MG tablet, Take 220 mg by mouth daily as needed (pain). , Disp: , Rfl:  .  polyethylene glycol powder (MIRALAX) powder, Take 1 Container by mouth 2 (two) times daily as needed for mild constipation or moderate constipation. , Disp: , Rfl:  .  vitamin B-12 (CYANOCOBALAMIN) 100 MCG tablet, Take 100 mcg by mouth daily., Disp: , Rfl:  .  ciprofloxacin (CIPRO) 500 MG tablet,  Take 1 tablet (500 mg total) by mouth 2 (two) times daily for 10 days., Disp: 20 tablet, Rfl: 0 .  HYDROcodone-acetaminophen (NORCO/VICODIN) 5-325 MG tablet, Take 1 tablet by mouth every 6 (six) hours as needed for moderate pain., Disp: 20 tablet, Rfl: 0 .  metroNIDAZOLE (FLAGYL) 500 MG tablet, Take 1 tablet (500 mg total) by mouth 3 (three) times daily for 10 days., Disp: 30 tablet, Rfl: 0   Vital signs in last 24 hrs: Vitals:   07/07/17 1312  BP: 124/68  Pulse: 70    Physical Exam  Pleasant and conversational, uncomfortable but not acutely ill-appearing  HEENT: sclera anicteric, oral mucosa moist without lesions  Neck: supple, no thyromegaly, JVD or lymphadenopathy  Cardiac: RRR without murmurs, S1S2 heard, no peripheral edema  Pulm: clear to auscultation bilaterally, normal RR and effort noted  Abdomen: soft, moderate LLQ tenderness, with active bowel sounds. No guarding or palpable hepatosplenomegaly.  No distention  Skin; warm and dry, no jaundice or rash  No recent data for review   @ASSESSMENTPLANBEGIN @ Assessment: Encounter Diagnoses  Name Primary?  . Diverticulitis of colon Yes  . LLQ abdominal pain   . Nausea without vomiting   . Early satiety   . Abdominal bloating   . Chronic constipation    Recent onset GI symptoms are somewhat difficult to characterize.  Possible partial obstruction, gastroparesis, functional dyspepsia, reflux.   I think they are probably unrelated to the left lower quadrant pain that has come on in the last couple of days, which I suspect is diverticulitis.   Plan: 10-day course of ciprofloxacin and metronidazole.  No alcohol consumption while taking metronidazole Vicodin 5/325, 1 tablet every 6 hours as needed for pain, dispense 20 tablets no refills Follow-up with either Dr. Fuller Plan or an APP in 2 weeks, call sooner if symptoms are worsening.  If so, CT scan would most likely be next step.  When she follows up, if diverticulitis is  resolved, discuss further testing for upper GI symptoms, most likely upper endoscopy and possibly colonoscopy.  Her last exam had limited visibility in August 2016 due to melanosis.   Total time 30 minutes, over half spent face-to-face with patient in counseling and coordination of care.   Nelida Meuse III

## 2017-07-07 NOTE — Patient Instructions (Addendum)
If you are age 53 or older, your body mass index should be between 23-30. Your Body mass index is 28.98 kg/m. If this is out of the aforementioned range listed, please consider follow up with your Primary Care Provider.  If you are age 29 or younger, your body mass index should be between 19-25. Your Body mass index is 28.98 kg/m. If this is out of the aformentioned range listed, please consider follow up with your Primary Care Provider.   We have sent the medications to your pharmacy for you to pick up at your convenience.   Please follow up in 2 weeks with PA Alonza Bogus   Thank you for choosing [facility]  Dr Wilfrid Lund III

## 2017-07-08 DIAGNOSIS — M9903 Segmental and somatic dysfunction of lumbar region: Secondary | ICD-10-CM | POA: Diagnosis not present

## 2017-07-08 DIAGNOSIS — M9901 Segmental and somatic dysfunction of cervical region: Secondary | ICD-10-CM | POA: Diagnosis not present

## 2017-07-08 DIAGNOSIS — M5033 Other cervical disc degeneration, cervicothoracic region: Secondary | ICD-10-CM | POA: Diagnosis not present

## 2017-07-13 DIAGNOSIS — M9903 Segmental and somatic dysfunction of lumbar region: Secondary | ICD-10-CM | POA: Diagnosis not present

## 2017-07-13 DIAGNOSIS — M9901 Segmental and somatic dysfunction of cervical region: Secondary | ICD-10-CM | POA: Diagnosis not present

## 2017-07-13 DIAGNOSIS — M5033 Other cervical disc degeneration, cervicothoracic region: Secondary | ICD-10-CM | POA: Diagnosis not present

## 2017-07-20 DIAGNOSIS — M9903 Segmental and somatic dysfunction of lumbar region: Secondary | ICD-10-CM | POA: Diagnosis not present

## 2017-07-20 DIAGNOSIS — M9901 Segmental and somatic dysfunction of cervical region: Secondary | ICD-10-CM | POA: Diagnosis not present

## 2017-07-20 DIAGNOSIS — M5033 Other cervical disc degeneration, cervicothoracic region: Secondary | ICD-10-CM | POA: Diagnosis not present

## 2017-07-21 ENCOUNTER — Encounter: Payer: Self-pay | Admitting: Gastroenterology

## 2017-07-21 ENCOUNTER — Ambulatory Visit: Payer: 59 | Admitting: Gastroenterology

## 2017-07-21 VITALS — BP 110/74 | HR 98 | Ht 66.0 in | Wt 186.0 lb

## 2017-07-21 DIAGNOSIS — K5900 Constipation, unspecified: Secondary | ICD-10-CM | POA: Diagnosis not present

## 2017-07-21 DIAGNOSIS — R1011 Right upper quadrant pain: Secondary | ICD-10-CM

## 2017-07-21 DIAGNOSIS — R140 Abdominal distension (gaseous): Secondary | ICD-10-CM

## 2017-07-21 DIAGNOSIS — R1031 Right lower quadrant pain: Secondary | ICD-10-CM | POA: Diagnosis not present

## 2017-07-21 DIAGNOSIS — R110 Nausea: Secondary | ICD-10-CM | POA: Insufficient documentation

## 2017-07-21 DIAGNOSIS — R1012 Left upper quadrant pain: Secondary | ICD-10-CM

## 2017-07-21 DIAGNOSIS — R198 Other specified symptoms and signs involving the digestive system and abdomen: Secondary | ICD-10-CM | POA: Insufficient documentation

## 2017-07-21 NOTE — Patient Instructions (Signed)
  If you are age 53 or younger, your body mass index should be between 19-25. Your Body mass index is 30.02 kg/m. If this is out of the aformentioned range listed, please consider follow up with your Primary Care Provider.   You have been scheduled for an endoscopy. Please follow written instructions given to you at your visit today. If you use inhalers (even only as needed), please bring them with you on the day of your procedure. Your physician has requested that you go to [email or web address] and enter the access code given to you at your visit today. This web site gives a general overview about your procedure. However, you should still follow specific instructions given to you by our office regarding your preparation for the procedure.

## 2017-07-21 NOTE — Progress Notes (Signed)
07/21/2017 Casey Mason [id number] 1965-08-19   HISTORY OF PRESENT ILLNESS: This is a pleasant 52 year old female who is a patient of Dr. Lynne Leader.  She was seen on July 07, 2017 by Dr. Loletha Carrow.  She was added to his schedule since I was out sick.  Anyway, she is here for 2-week follow-up.  She was treated with Cipro and Flagyl for suspected diverticulitis with left lower quadrant abdominal pain.  She says that she just completed those antibiotics and those symptoms have about completely resolved.  She is just slightly sore in left lower quadrant.  She has long-standing constipation and is currently taking some type of "intestinal formula" capsule (which appears to be Senokot) as well as apple cider vinegar, which she says she feels like is working for now as she has a bowel movement daily.  She says that previously she took MiraLAX daily and that worked for quite some time, but then this seemed to stop working.  She did increase MiraLAX to twice daily, but that seemed to be too potent.  Last colonoscopy had been done in August 2016 with only hyperplastic polyp, but limitation of diffuse melanosis coli.  That was done at [facility] by Dr. Britta Mccreedy.  She continues to complain of feelings of upper abdominal bloating/fullness, early satiety, and frequent nausea that often occur in the afternoon, precluding supper meal.  She feels as if she may vomit, but typically does not.  She lost about 10 pounds from sometime last year to a few months ago, but has been stable since then.  The symptoms have been present for about 8 weeks now.  Denies any sensation of heartburn/reflux.   Past Medical History:  Diagnosis Date  . Diverticulosis of colon   . Family history of adverse reaction to anesthesia    mother-- ponv  . History of colon polyps    2016- hyperplastic  . History of diverticulitis of colon   . Nephrolithiasis    right  . PONV (postoperative nausea and vomiting)   . Right ureteral stone     Past Surgical History:  Procedure Laterality Date  . CESAREAN SECTION  1988  . COLONOSCOPY  11-10-2014  in [place]  . CYSTOSCOPY WITH RETROGRADE PYELOGRAM, URETEROSCOPY AND STENT PLACEMENT Right 02/07/2016   Procedure: CYSTOSCOPY WITH RIGHT PYLEOGRAM  RIGHT URETEROSCOPY, , DIALATION OF RIGHT URETER STRICUTURE FLEXIBLE RIGHT URETEROSCOPY, RIGHT PYELOSCOPY,  AND RIGHT DOUBLE STENT PLACEMENT WITH TETHER;  Surgeon: Carolan Clines, MD;  Location: [facility];  Service: Urology;  Laterality: Right;  . EXTRACORPOREAL SHOCK WAVE LITHOTRIPSY Right 09/20/2015  . ROTATOR CUFF REPAIR Right 2013 or 2014??  . TONSILLECTOMY  2003  . TUBAL LIGATION  1992  . VAGINAL HYSTERECTOMY  2005    reports that she has been smoking cigarettes.  She has a 7.50 pack-year smoking history. She has never used smokeless tobacco. She reports that she drinks alcohol. She reports that she does not use drugs. family history includes Anemia in her mother. Allergies  Allergen Reactions  . Oxycodone Itching  . Sulfa Antibiotics Itching      Outpatient Encounter Medications as of 07/21/2017  Medication Sig  . Apple Cider Vinegar 500 MG TABS Take by mouth.  Marland Kitchen aspirin 81 MG tablet Take 81 mg by mouth daily.  . Cholecalciferol (VITAMIN D3) 2000 units TABS Take 1 tablet by mouth daily.  Marland Kitchen HYDROcodone-acetaminophen (NORCO/VICODIN) 5-325 MG tablet Take 1 tablet by mouth every 6 (six) hours as needed for moderate  pain.  . MISC NATURAL PRODUCTS PO Take by mouth. Dr. Guy Begin intestinal formula 2 capsules daily  . Multiple Vitamin (MULTIVITAMIN) tablet Take 1 tablet by mouth daily.  . naproxen sodium (ANAPROX) 220 MG tablet Take 220 mg by mouth daily as needed (pain).   . polyethylene glycol powder (MIRALAX) powder Take 1 Container by mouth 2 (two) times daily as needed for mild constipation or moderate constipation.   . vitamin B-12 (CYANOCOBALAMIN) 100 MCG tablet Take 100 mcg by mouth daily.  . [DISCONTINUED] MISC  NATURAL PRODUCTS PO Take by mouth. EstroG-100 1 capsule BID   No facility-administered encounter medications on file as of 07/21/2017.      REVIEW OF SYSTEMS  : All other systems reviewed and negative except where noted in the History of Present Illness.   PHYSICAL EXAM: BP 110/74   Pulse 98   Ht 5\' 6"  (1.676 m)   Wt 186 lb (84.4 kg)   BMI 30.02 kg/m  General: Well developed white female in no acute distress Head: Normocephalic and atraumatic Eyes:  Sclerae anicteric, conjunctiva pink. Ears: Normal auditory acuity Lungs: Clear throughout to auscultation; no increased WOB. Heart: Regular rate and rhythm; no M/R/G. Abdomen: Soft, non-distended.  BS present.  Mild epigastric TTP. Musculoskeletal: Symmetrical with no gross deformities  Skin: No lesions on visible extremities Extremities: No edema  Neurological: Alert oriented x 4, grossly non-focal Psychological:  Alert and cooperative. Normal mood and affect  ASSESSMENT AND PLAN: *Upper abdominal discomfort/fullness with bloating and nausea:  ? If this is reflux related although she does not have classic reflux symptoms.  Will schedule for an EGD with Dr. Fuller Plan. *LLQ abdominal pain:  Suspected diverticulitis treated with cipro and flagyl and symptoms have almost completely resolved. *Constipation:  Seems to be doing well on her current regimen of "Intestinal formula" (which appears to be Senokot) and apple cider vinegar tabs.  We discussed possibly re-trying Miralax BID vs Linzess vs Amitiza.  **The risks, benefits, and alternatives to EGD were discussed with the patient and she consents to proceed.   CC:  [name], MD

## 2017-07-22 ENCOUNTER — Encounter: Payer: Self-pay | Admitting: Gastroenterology

## 2017-07-22 NOTE — Progress Notes (Signed)
Reviewed and agree with initial management plan.  Corbin Hott T. Dewell Monnier, MD FACG 

## 2017-07-23 ENCOUNTER — Telehealth: Payer: Self-pay | Admitting: Gastroenterology

## 2017-07-23 NOTE — Telephone Encounter (Signed)
Generated code for patient and gave patient this information. Patient verbalized understanding.

## 2017-07-23 NOTE — Telephone Encounter (Signed)
GO TO THIS SITE: [email or web address]  ACCESS CODE: [id number]   Left a message for patient to return my call.

## 2017-07-31 ENCOUNTER — Other Ambulatory Visit: Payer: Self-pay

## 2017-07-31 ENCOUNTER — Encounter: Payer: Self-pay | Admitting: Gastroenterology

## 2017-07-31 ENCOUNTER — Ambulatory Visit (AMBULATORY_SURGERY_CENTER): Payer: 59 | Admitting: Gastroenterology

## 2017-07-31 VITALS — BP 112/75 | HR 60 | Temp 97.8°F | Resp 13 | Ht 66.0 in | Wt 186.0 lb

## 2017-07-31 DIAGNOSIS — K222 Esophageal obstruction: Secondary | ICD-10-CM

## 2017-07-31 DIAGNOSIS — K2950 Unspecified chronic gastritis without bleeding: Secondary | ICD-10-CM | POA: Diagnosis not present

## 2017-07-31 DIAGNOSIS — K2980 Duodenitis without bleeding: Secondary | ICD-10-CM

## 2017-07-31 DIAGNOSIS — K2900 Acute gastritis without bleeding: Secondary | ICD-10-CM

## 2017-07-31 DIAGNOSIS — R1310 Dysphagia, unspecified: Secondary | ICD-10-CM

## 2017-07-31 DIAGNOSIS — R1013 Epigastric pain: Secondary | ICD-10-CM

## 2017-07-31 DIAGNOSIS — R110 Nausea: Secondary | ICD-10-CM | POA: Diagnosis not present

## 2017-07-31 DIAGNOSIS — R1319 Other dysphagia: Secondary | ICD-10-CM

## 2017-07-31 MED ORDER — PANTOPRAZOLE SODIUM 40 MG PO TBEC: 40.0000 mg | DELAYED_RELEASE_TABLET | Freq: Every day | ORAL | 3 refills | Status: DC

## 2017-07-31 MED ORDER — SODIUM CHLORIDE 0.9 % IV SOLN: 500.0000 mL | Freq: Once | INTRAVENOUS | Status: DC

## 2017-07-31 NOTE — Op Note (Signed)
[facility] Patient Name: Casey Mason Procedure Date: 07/31/2017 10:00 AM MRN: [id number] Endoscopist: Ladene Artist , MD Age: 52 Referring MD:  Date of Birth: May 14, 1965 Gender: Female Account #: [id number] Procedure:                Upper GI endoscopy Indications:              Epigastric abdominal pain, Dysphagia Medicines:                Monitored Anesthesia Care Procedure:                Pre-Anesthesia Assessment:                           - Prior to the procedure, a History and Physical                            was performed, and patient medications and                            allergies were reviewed. The patient's tolerance of                            previous anesthesia was also reviewed. The risks                            and benefits of the procedure and the sedation                            options and risks were discussed with the patient.                            All questions were answered, and informed consent                            was obtained. Prior Anticoagulants: The patient has                            taken no previous anticoagulant or antiplatelet                            agents. ASA Grade Assessment: II - A patient with                            mild systemic disease. After reviewing the risks                            and benefits, the patient was deemed in                            satisfactory condition to undergo the procedure.                           After obtaining informed consent, the endoscope was  passed under direct vision. Throughout the                            procedure, the patient's blood pressure, pulse, and                            oxygen saturations were monitored continuously. The                            Model GIF-HQ190 [id number]) scope was introduced                            through the mouth, and advanced to the second part                            of duodenum. The  upper GI endoscopy was                            accomplished without difficulty. The patient                            tolerated the procedure well. Scope In: Scope Out: Findings:                 LA Grade B (one or more mucosal breaks greater than                            5 mm, not extending between the tops of two mucosal                            folds) esophagitis with no bleeding was found in                            the distal esophagus.                           One benign-appearing, intrinsic moderate stenosis                            was found at the gastroesophageal junction. This                            stenosis measured 1.4 cm (inner diameter). The                            stenosis was traversed. A guidewire was placed and                            the scope was withdrawn. Dilations were performed                            with Savary dilators with mild resistance at 15 mm,  16 mm, 17 mm.                           The exam of the esophagus was otherwise normal.                           Patchy mild inflammation characterized by erosions,                            erythema and granularity was found in the gastric                            body and in the gastric antrum. Biopsies were taken                            with a cold forceps for histology.                           A small hiatal hernia was present.                           The exam of the stomach was otherwise normal.                           A single localized erosion without bleeding was                            found in the duodenal bulb.                           The second portion of the duodenum was normal. Complications:            No immediate complications. Estimated Blood Loss:     Estimated blood loss was minimal. Impression:               - LA Grade B reflux esophagitis.                           - Benign-appearing esophageal stenosis. Dilated.                            - Gastritis. Biopsied.                           - Small hiatal hernia.                           - Duodenal erosion without bleeding.                           - Normal second portion of the duodenum. Recommendation:           - Patient has a contact number available for                            emergencies. The signs and symptoms of potential  delayed complications were discussed with the                            patient. Return to normal activities tomorrow.                            Written discharge instructions were provided to the                            patient.                           - Clear liquid diet for 2 hours, then advance as                            tolerated to soft diet today.                           - Continue present medications.                           - Await pathology results.                           - Protonix (pantoprazole) 40 mg PO daily, 1 year of                            refills.                           - Return to GI office in 6 weeks. Ladene Artist, MD 07/31/2017 10:28:07 AM This report has been signed electronically.

## 2017-07-31 NOTE — Progress Notes (Signed)
Called to room to assist during endoscopic procedure.  Patient ID and intended procedure confirmed with present staff. Received instructions for my participation in the procedure from the performing physician.  

## 2017-07-31 NOTE — Progress Notes (Signed)
To recovery, report to RN, VSS. 

## 2017-07-31 NOTE — Patient Instructions (Signed)
YOU HAD AN ENDOSCOPIC PROCEDURE TODAY AT [facility]:   Refer to the procedure report that was given to you for any specific questions about what was found during the examination.  If the procedure report does not answer your questions, please call your gastroenterologist to clarify.  If you requested that your care partner not be given the details of your procedure findings, then the procedure report has been included in a sealed envelope for you to review at your convenience later.  YOU SHOULD EXPECT: Some feelings of bloating in the abdomen. Passage of more gas than usual.  Walking can help get rid of the air that was put into your GI tract during the procedure and reduce the bloating. If you had a lower endoscopy (such as a colonoscopy or flexible sigmoidoscopy) you may notice spotting of blood in your stool or on the toilet paper. If you underwent a bowel prep for your procedure, you may not have a normal bowel movement for a few days.  Please Note:  You might notice some irritation and congestion in your nose or some drainage.  This is from the oxygen used during your procedure.  There is no need for concern and it should clear up in a day or so.  SYMPTOMS TO REPORT IMMEDIATELY:    Following upper endoscopy (EGD)  Vomiting of blood or coffee ground material  New chest pain or pain under the shoulder blades  Painful or persistently difficult swallowing  New shortness of breath  Fever of 100F or higher  Black, tarry-looking stools  For urgent or emergent issues, a gastroenterologist can be reached at any hour by calling [phone].   DIET:  Please follow a post-dilation diet (see handout given to you by your recovery nurse), clear liquid for 2 hours starting at 11:30am, then soft diet for the rest of today starting at 1:30pm, but then you may proceed to your regular diet as tolerated tomorrow.  Drink plenty of fluids but you should avoid alcoholic beverages for 24  hours.  MEDICATIONS: Continue present medications. Take Protonix (Pantoprazole) 40 mg by mouth daily. Prescription sent to your pharmacy electronically.  FOLLOW UP: Please return to Dr. Lynne Leader office in 6 weeks.  Please see handouts given to you by your recovery nurse.  ACTIVITY:  You should plan to take it easy for the rest of today and you should NOT DRIVE or use heavy machinery until tomorrow (because of the sedation medicines used during the test).    FOLLOW UP: Our staff will call the number listed on your records the next business day following your procedure to check on you and address any questions or concerns that you may have regarding the information given to you following your procedure. If we do not reach you, we will leave a message.  However, if you are feeling well and you are not experiencing any problems, there is no need to return our call.  We will assume that you have returned to your regular daily activities without incident.  If any biopsies were taken you will be contacted by phone or by letter within the next 1-3 weeks.  Please call us at [phone] if you have not heard about the biopsies in 3 weeks.  Thank you for allowing [place] to provide for your healthcare needs today.    SIGNATURES/CONFIDENTIALITY: You and/or your care partner have signed paperwork which will be entered into your electronic medical record.  These signatures attest to the fact  that that the information above on your After Visit Summary has been reviewed and is understood.  Full responsibility of the confidentiality of this discharge information lies with you and/or your care-partner.

## 2017-08-03 ENCOUNTER — Telehealth: Payer: Self-pay

## 2017-08-03 NOTE — Telephone Encounter (Signed)
  Follow up Call-  Call back number 07/31/2017  Post procedure Call Back phone  # [phone]  Permission to leave phone message Yes  Some recent data might be hidden     Patient questions:  Do you have a fever, pain , or abdominal swelling? No. Pain Score  0 *  Have you tolerated food without any problems? Yes.    Have you been able to return to your normal activities? Yes.    Do you have any questions about your discharge instructions: Diet   No. Medications  No. Follow up visit  No.  Do you have questions or concerns about your Care? No.  Actions: * If pain score is 4 or above: No action needed, pain <4.   No problems noted per pt. maw

## 2017-08-07 ENCOUNTER — Telehealth: Payer: Self-pay

## 2017-08-07 ENCOUNTER — Telehealth: Payer: Self-pay | Admitting: Gastroenterology

## 2017-08-07 NOTE — Telephone Encounter (Signed)
She continues to have pain and bloating.  Taking Probiotic with minimal improvement.  Looking for pathology results.

## 2017-08-07 NOTE — Telephone Encounter (Signed)
lmtcb 5/10  PKW

## 2017-08-09 NOTE — Telephone Encounter (Signed)
Pathology results are sent by letter within 2 weeks. Her biopsy only showed mild gastritis.   Increase Protonix to 40 mg po bid for 2 months and then resume Protonix 40 mg po qam.  Make sure she is taking enough laxatives for a complete bowel movement every day.  Follow all antireflux measures.  Call us back in 1 -2 weeks if symptoms not improving.

## 2017-08-10 NOTE — Telephone Encounter (Signed)
Left message for patient to call back  

## 2017-08-11 ENCOUNTER — Encounter: Payer: Self-pay | Admitting: Gastroenterology

## 2017-08-11 MED ORDER — PANTOPRAZOLE SODIUM 40 MG PO TBEC: 40.0000 mg | DELAYED_RELEASE_TABLET | Freq: Two times a day (BID) | ORAL | 3 refills | Status: DC

## 2017-08-11 NOTE — Addendum Note (Signed)
Addended by: Marlon Pel on: 08/11/2017 03:34 PM   Modules accepted: Orders

## 2017-08-11 NOTE — Telephone Encounter (Signed)
Patient notified of recommendations. 

## 2017-08-11 NOTE — Telephone Encounter (Signed)
Left message for patient to call back  

## 2017-08-18 DIAGNOSIS — N6001 Solitary cyst of right breast: Secondary | ICD-10-CM | POA: Diagnosis not present

## 2017-08-18 DIAGNOSIS — Z09 Encounter for follow-up examination after completed treatment for conditions other than malignant neoplasm: Secondary | ICD-10-CM | POA: Diagnosis not present

## 2017-10-05 ENCOUNTER — Ambulatory Visit: Payer: 59 | Admitting: Gastroenterology

## 2017-10-05 ENCOUNTER — Encounter: Payer: Self-pay | Admitting: Gastroenterology

## 2017-10-05 ENCOUNTER — Encounter

## 2017-10-05 ENCOUNTER — Encounter (INDEPENDENT_AMBULATORY_CARE_PROVIDER_SITE_OTHER): Payer: Self-pay

## 2017-10-05 VITALS — BP 128/80 | HR 72 | Ht 66.0 in | Wt 176.2 lb

## 2017-10-05 DIAGNOSIS — K2100 Gastro-esophageal reflux disease with esophagitis, without bleeding: Secondary | ICD-10-CM

## 2017-10-05 DIAGNOSIS — K5901 Slow transit constipation: Secondary | ICD-10-CM

## 2017-10-05 NOTE — Patient Instructions (Signed)
Increase your Miralax to twice a day.   Thank you for choosing me and [facility].  Pricilla Riffle. Dagoberto Ligas., MD., Marval Regal

## 2017-10-05 NOTE — Progress Notes (Signed)
    History of Present Illness: This is a 52 year old female with an esophageal stricture, LA Class B erosive esophagitis, duodenitis, gastritis.  She has intermittent difficulties with constipation despite taking MiraLAX daily.  This often leads to bloating and mild abdominal discomfort.  Her nausea and bloating improved with increasing PPI to twice daily and improved management of her constipation. She underwent EGD with dilation in May with relief of dysphagia. Reflux symptoms controlled on current medication.   Current Medications, Allergies, Past Medical History, Past Surgical History, Family History and Social History were reviewed in [employer] record.  Physical Exam: General: Well developed, well nourished, no acute distress Head: Normocephalic and atraumatic Eyes:  sclerae anicteric, EOMI Ears: Normal auditory acuity Mouth: No deformity or lesions Lungs: Clear throughout to auscultation Heart: Regular rate and rhythm; no murmurs, rubs or bruits Abdomen: Soft, non tender and non distended. No masses, hepatosplenomegaly or hernias noted. Normal Bowel sounds Rectal: Not done Musculoskeletal: Symmetrical with no gross deformities  Pulses:  Normal pulses noted Extremities: No clubbing, cyanosis, edema or deformities noted Neurological: Alert oriented x 4, grossly nonfocal Psychological:  Alert and cooperative. Normal mood and affect  Assessment and Recommendations:  1. GERD with LA Class B erosive esophagitis and an esophageal stricture.  Follow standard antireflux measures long-term.  Continue pantoprazole 40 mg twice daily long-term. REV in 1 year.   2. Constipation, bloating.  Increase MiraLAX to twice daily.  If symptoms not under better control within 1 month she is advised to call for further advice.  Consider Trulance or Linzess.  3. Colorectal cancer screening plan for August 2021 at a five-year interval given the severity of melanosis that limited the  quality of the exam.

## 2017-10-19 DIAGNOSIS — M9903 Segmental and somatic dysfunction of lumbar region: Secondary | ICD-10-CM | POA: Diagnosis not present

## 2017-10-19 DIAGNOSIS — M9901 Segmental and somatic dysfunction of cervical region: Secondary | ICD-10-CM | POA: Diagnosis not present

## 2017-10-19 DIAGNOSIS — M5033 Other cervical disc degeneration, cervicothoracic region: Secondary | ICD-10-CM | POA: Diagnosis not present

## 2017-11-05 DIAGNOSIS — M9901 Segmental and somatic dysfunction of cervical region: Secondary | ICD-10-CM | POA: Diagnosis not present

## 2017-11-05 DIAGNOSIS — M5033 Other cervical disc degeneration, cervicothoracic region: Secondary | ICD-10-CM | POA: Diagnosis not present

## 2017-11-05 DIAGNOSIS — M9903 Segmental and somatic dysfunction of lumbar region: Secondary | ICD-10-CM | POA: Diagnosis not present

## 2017-12-03 DIAGNOSIS — Z6826 Body mass index (BMI) 26.0-26.9, adult: Secondary | ICD-10-CM | POA: Diagnosis not present

## 2017-12-03 DIAGNOSIS — R634 Abnormal weight loss: Secondary | ICD-10-CM | POA: Diagnosis not present

## 2017-12-03 DIAGNOSIS — R1084 Generalized abdominal pain: Secondary | ICD-10-CM | POA: Diagnosis not present

## 2017-12-07 DIAGNOSIS — R739 Hyperglycemia, unspecified: Secondary | ICD-10-CM | POA: Diagnosis not present

## 2017-12-07 DIAGNOSIS — R634 Abnormal weight loss: Secondary | ICD-10-CM | POA: Diagnosis not present

## 2017-12-07 DIAGNOSIS — R1084 Generalized abdominal pain: Secondary | ICD-10-CM | POA: Diagnosis not present

## 2017-12-23 DIAGNOSIS — R600 Localized edema: Secondary | ICD-10-CM | POA: Diagnosis not present

## 2017-12-23 DIAGNOSIS — R102 Pelvic and perineal pain: Secondary | ICD-10-CM | POA: Diagnosis not present

## 2017-12-23 DIAGNOSIS — N819 Female genital prolapse, unspecified: Secondary | ICD-10-CM | POA: Diagnosis not present

## 2017-12-24 DIAGNOSIS — E1165 Type 2 diabetes mellitus with hyperglycemia: Secondary | ICD-10-CM | POA: Diagnosis not present

## 2017-12-24 DIAGNOSIS — M7989 Other specified soft tissue disorders: Secondary | ICD-10-CM | POA: Diagnosis not present

## 2017-12-24 DIAGNOSIS — M79604 Pain in right leg: Secondary | ICD-10-CM | POA: Diagnosis not present

## 2017-12-24 DIAGNOSIS — Z6825 Body mass index (BMI) 25.0-25.9, adult: Secondary | ICD-10-CM | POA: Diagnosis not present

## 2017-12-24 DIAGNOSIS — R600 Localized edema: Secondary | ICD-10-CM | POA: Diagnosis not present

## 2017-12-25 ENCOUNTER — Other Ambulatory Visit (HOSPITAL_COMMUNITY): Payer: Self-pay | Admitting: Obstetrics and Gynecology

## 2017-12-25 DIAGNOSIS — R609 Edema, unspecified: Secondary | ICD-10-CM

## 2017-12-28 ENCOUNTER — Encounter (HOSPITAL_COMMUNITY): Payer: 59

## 2018-01-04 DIAGNOSIS — K219 Gastro-esophageal reflux disease without esophagitis: Secondary | ICD-10-CM | POA: Diagnosis not present

## 2018-01-04 DIAGNOSIS — E1165 Type 2 diabetes mellitus with hyperglycemia: Secondary | ICD-10-CM | POA: Diagnosis not present

## 2018-01-04 DIAGNOSIS — Z6825 Body mass index (BMI) 25.0-25.9, adult: Secondary | ICD-10-CM | POA: Diagnosis not present

## 2018-01-05 DIAGNOSIS — Z01419 Encounter for gynecological examination (general) (routine) without abnormal findings: Secondary | ICD-10-CM | POA: Diagnosis not present

## 2018-01-05 DIAGNOSIS — Z6826 Body mass index (BMI) 26.0-26.9, adult: Secondary | ICD-10-CM | POA: Diagnosis not present

## 2018-01-21 DIAGNOSIS — E1165 Type 2 diabetes mellitus with hyperglycemia: Secondary | ICD-10-CM | POA: Diagnosis not present

## 2018-01-21 DIAGNOSIS — F17200 Nicotine dependence, unspecified, uncomplicated: Secondary | ICD-10-CM | POA: Diagnosis not present

## 2018-01-24 DIAGNOSIS — K219 Gastro-esophageal reflux disease without esophagitis: Secondary | ICD-10-CM | POA: Insufficient documentation

## 2018-01-24 DIAGNOSIS — K900 Celiac disease: Secondary | ICD-10-CM | POA: Insufficient documentation

## 2018-01-28 ENCOUNTER — Other Ambulatory Visit: Payer: Self-pay | Admitting: Gastroenterology

## 2018-02-04 ENCOUNTER — Ambulatory Visit: Payer: Self-pay | Admitting: "Endocrinology

## 2018-02-04 DIAGNOSIS — M818 Other osteoporosis without current pathological fracture: Secondary | ICD-10-CM | POA: Diagnosis not present

## 2018-02-04 DIAGNOSIS — M810 Age-related osteoporosis without current pathological fracture: Secondary | ICD-10-CM | POA: Diagnosis not present

## 2018-02-19 DIAGNOSIS — R922 Inconclusive mammogram: Secondary | ICD-10-CM | POA: Diagnosis not present

## 2018-02-19 DIAGNOSIS — N6312 Unspecified lump in the right breast, upper inner quadrant: Secondary | ICD-10-CM | POA: Diagnosis not present

## 2018-02-19 DIAGNOSIS — R928 Other abnormal and inconclusive findings on diagnostic imaging of breast: Secondary | ICD-10-CM | POA: Diagnosis not present

## 2018-02-24 DIAGNOSIS — R350 Frequency of micturition: Secondary | ICD-10-CM | POA: Diagnosis not present

## 2018-02-24 DIAGNOSIS — N8111 Cystocele, midline: Secondary | ICD-10-CM | POA: Diagnosis not present

## 2018-02-24 DIAGNOSIS — Z4689 Encounter for fitting and adjustment of other specified devices: Secondary | ICD-10-CM | POA: Diagnosis not present

## 2018-03-12 ENCOUNTER — Encounter: Payer: Self-pay | Admitting: *Deleted

## 2018-03-12 ENCOUNTER — Encounter: Payer: 59 | Attending: Surgery | Admitting: *Deleted

## 2018-03-12 VITALS — BP 110/78 | Ht 66.0 in | Wt 150.9 lb

## 2018-03-12 DIAGNOSIS — E1165 Type 2 diabetes mellitus with hyperglycemia: Secondary | ICD-10-CM | POA: Diagnosis not present

## 2018-03-12 DIAGNOSIS — Z713 Dietary counseling and surveillance: Secondary | ICD-10-CM | POA: Diagnosis not present

## 2018-03-12 DIAGNOSIS — Z6824 Body mass index (BMI) 24.0-24.9, adult: Secondary | ICD-10-CM | POA: Diagnosis not present

## 2018-03-12 DIAGNOSIS — E119 Type 2 diabetes mellitus without complications: Secondary | ICD-10-CM

## 2018-03-12 NOTE — Progress Notes (Signed)
Diabetes Self-Management Education  Visit Type: First/Initial  Appt. Start Time: 1355 Appt. End Time: 1510  03/12/2018  [name], identified by name and date of birth, is a 52 y.o. female with a diagnosis of Diabetes: Type 2.   ASSESSMENT  Blood pressure 110/78, height 5\' 6"  (1.676 m), weight 150 lb 14.4 oz (68.4 kg). Body mass index is 24.36 kg/m.  Diabetes Self-Management Education - 03/12/18 1534      Visit Information   Visit Type  First/Initial      Initial Visit   Diabetes Type  Type 2    Are you currently following a meal plan?  Yes    What type of meal plan do you follow?  portion size per recommendations - more fruit and veggies    Are you taking your medications as prescribed?  No   hasn't started Boniva yet - will start after vacation next month   Date Diagnosed  12 weeks ago      Health Coping   How would you rate your overall health?  Good      Psychosocial Assessment   Patient Belief/Attitude about Diabetes  Motivated to manage diabetes   "disturbed"   Self-care barriers  None    Self-management support  Doctor's office;Family;Friends    Patient Concerns  Nutrition/Meal planning;Glycemic Control;Medication;Monitoring    Special Needs  None    Preferred Learning Style  Visual;Hands on;Auditory    Learning Readiness  Change in progress    How often do you need to have someone help you when you read instructions, pamphlets, or other written materials from your doctor or pharmacy?  1 - Never    What is the last grade level you completed in school?  college      Pre-Education Assessment   Patient understands the diabetes disease and treatment process.  Needs Instruction    Patient understands incorporating nutritional management into lifestyle.  Needs Instruction    Patient undertands incorporating physical activity into lifestyle.  Needs Instruction    Patient understands using medications safely.  Needs Instruction    Patient understands monitoring  blood glucose, interpreting and using results  Needs Review    Patient understands prevention, detection, and treatment of acute complications.  Needs Instruction    Patient understands prevention, detection, and treatment of chronic complications.  Needs Instruction    Patient understands how to develop strategies to address psychosocial issues.  Needs Instruction    Patient understands how to develop strategies to promote health/change behavior.  Needs Instruction      Complications   Last HgB A1C per patient/outside source  14.3 %   12/07/17   How often do you check your blood sugar?  3-4 times/day    Fasting Blood glucose range (mg/dL)  70-129   Pt reports FBG's 112-130 mg/dL.   Postprandial Blood glucose range (mg/dL)  130-179;70-129   She reports pp's <180 mg/dL. Readings before supper 130-160 mg/dL.    Have you had a dilated eye exam in the past 12 months?  Yes    Have you had a dental exam in the past 12 months?  Yes    Are you checking your feet?  Yes    How many days per week are you checking your feet?  7      Dietary Intake   Breakfast  6:45 am - has mozarella cheese stick    Snack (morning)  9:00 am - blueberries, raspberries with cottage cheese and few fried onions; [place]  yogurt; granola, almonds, or nut bar    Lunch  12:00 - salad with cheese, radishes, carrots, olives, seeds, few crotons or flat bread with ham, cheese, pesto sauce, few BBQ chips and sugar free jello or pudding    Snack (afternoon)  3-4:00 pm - same as morning snack    Dinner  beef, chicken or fish - potatoes 1 x week, peas, beans, green beans, carrots, cuccumbers, broccoli, cauliflower    Beverage(s)  water, occasional diet soda or Crystal Light      Exercise   Exercise Type  Light (walking / raking leaves)    How many days per week to you exercise?  7    How many minutes per day do you exercise?  20    Total minutes per week of exercise  140      Patient Education   Previous Diabetes Education  No     Disease state   Definition of diabetes, type 1 and 2, and the diagnosis of diabetes;Factors that contribute to the development of diabetes    Nutrition management   Role of diet in the treatment of diabetes and the relationship between the three main macronutrients and blood glucose level;Carbohydrate counting;Food label reading, portion sizes and measuring food.;Reviewed blood glucose goals for pre and post meals and how to evaluate the patients' food intake on their blood glucose level.    Physical activity and exercise   Role of exercise on diabetes management, blood pressure control and cardiac health.    Medications  Reviewed patients medication for diabetes, action, purpose, timing of dose and side effects.    Monitoring  Purpose and frequency of SMBG.;Taught/discussed recording of test results and interpretation of SMBG.;Identified appropriate SMBG and/or A1C goals.;Yearly dilated eye exam    Chronic complications  Relationship between chronic complications and blood glucose control    Psychosocial adjustment  Role of stress on diabetes;Identified and addressed patients feelings and concerns about diabetes    Personal strategies to promote health  Review risk of smoking and offered smoking cessation      Individualized Goals (developed by patient)   Reducing Risk  Improve blood sugars Decrease medications Prevent diabetes complications Quit smoking     Outcomes   Expected Outcomes  Demonstrated interest in learning. Expect positive outcomes    Future DMSE  4-6 wks       Individualized Plan for Diabetes Self-Management Training:   Learning Objective:  Patient will have a greater understanding of diabetes self-management. Patient education plan is to attend individual and/or group sessions per assessed needs and concerns.   Plan:   Patient Instructions  Check blood sugars 2 x day before breakfast and 2 hrs after one meal every day or as directed by MD Bring blood sugar records  to the next cass Exercise: Continue walking and gradually increase to 150 minutes/week Eat 3 meals day, 1-2  snacks a day Space meals 4-6 hours apart Quit smoking  Expected Outcomes:  Demonstrated interest in learning. Expect positive outcomes  Education material provided:  General Meal Planning Guidelines Simple Meal Plan Quit Smoking Handout  If problems or questions, patient to contact team via:  Johny Drilling, RN, CCM, CDE [phone]  Future DSME appointment: 4-6 wks  April 05, 2018 for Diabetes Class 1

## 2018-03-12 NOTE — Patient Instructions (Addendum)
Check blood sugars 2 x day before breakfast and 2 hrs after one meal every day or as directed by MD Bring blood sugar records to the next cass  Exercise: Continue walking and gradually increase to 150 minutes/week  Eat 3 meals day, 1-2  snacks a day Space meals 4-6 hours apart  Quit smoking  Return for classes on:

## 2018-03-17 DIAGNOSIS — M5033 Other cervical disc degeneration, cervicothoracic region: Secondary | ICD-10-CM | POA: Diagnosis not present

## 2018-03-17 DIAGNOSIS — M9903 Segmental and somatic dysfunction of lumbar region: Secondary | ICD-10-CM | POA: Diagnosis not present

## 2018-03-17 DIAGNOSIS — M9901 Segmental and somatic dysfunction of cervical region: Secondary | ICD-10-CM | POA: Diagnosis not present

## 2018-03-17 DIAGNOSIS — E1165 Type 2 diabetes mellitus with hyperglycemia: Secondary | ICD-10-CM | POA: Diagnosis not present

## 2018-03-17 DIAGNOSIS — F17200 Nicotine dependence, unspecified, uncomplicated: Secondary | ICD-10-CM | POA: Diagnosis not present

## 2018-04-01 DIAGNOSIS — M5033 Other cervical disc degeneration, cervicothoracic region: Secondary | ICD-10-CM | POA: Diagnosis not present

## 2018-04-01 DIAGNOSIS — M9903 Segmental and somatic dysfunction of lumbar region: Secondary | ICD-10-CM | POA: Diagnosis not present

## 2018-04-01 DIAGNOSIS — M9901 Segmental and somatic dysfunction of cervical region: Secondary | ICD-10-CM | POA: Diagnosis not present

## 2018-04-05 ENCOUNTER — Encounter: Payer: Self-pay | Admitting: Dietician

## 2018-04-05 ENCOUNTER — Encounter: Payer: 59 | Attending: Surgery | Admitting: Dietician

## 2018-04-05 VITALS — Ht 66.0 in | Wt 150.5 lb

## 2018-04-05 DIAGNOSIS — E119 Type 2 diabetes mellitus without complications: Secondary | ICD-10-CM

## 2018-04-05 DIAGNOSIS — Z6824 Body mass index (BMI) 24.0-24.9, adult: Secondary | ICD-10-CM | POA: Insufficient documentation

## 2018-04-05 DIAGNOSIS — E1165 Type 2 diabetes mellitus with hyperglycemia: Secondary | ICD-10-CM | POA: Diagnosis not present

## 2018-04-05 DIAGNOSIS — Z713 Dietary counseling and surveillance: Secondary | ICD-10-CM | POA: Insufficient documentation

## 2018-04-05 NOTE — Progress Notes (Signed)

## 2018-04-06 DIAGNOSIS — M9901 Segmental and somatic dysfunction of cervical region: Secondary | ICD-10-CM | POA: Diagnosis not present

## 2018-04-06 DIAGNOSIS — M5033 Other cervical disc degeneration, cervicothoracic region: Secondary | ICD-10-CM | POA: Diagnosis not present

## 2018-04-06 DIAGNOSIS — M9903 Segmental and somatic dysfunction of lumbar region: Secondary | ICD-10-CM | POA: Diagnosis not present

## 2018-04-12 ENCOUNTER — Encounter: Payer: Self-pay | Admitting: Dietician

## 2018-04-12 ENCOUNTER — Encounter: Payer: 59 | Admitting: Dietician

## 2018-04-12 VITALS — Wt 149.3 lb

## 2018-04-12 DIAGNOSIS — E119 Type 2 diabetes mellitus without complications: Secondary | ICD-10-CM

## 2018-04-12 DIAGNOSIS — E1165 Type 2 diabetes mellitus with hyperglycemia: Secondary | ICD-10-CM | POA: Diagnosis not present

## 2018-04-12 NOTE — Progress Notes (Addendum)
Appt. Start Time: 1730 Appt. End Time: 2030  Class 2 Nutritional Management - identify sources of carbohydrate, protein and fat; plan balanced meals; estimate servings of carbohydrates in meals  Psychosocial - identify DM as a source of stress; state the effects of stress on BG control  Exercise - describe the effects of exercise on blood glucose and importance of regular exercise in controlling diabetes; state a plan for personal exercise; verbalize contraindications for exercise  Self-Monitoring - state importance of SMBG; use SMBG results to effectively manage diabetes; identify importance of regular HbA1C testing and goals for results  Acute Complications - recognize hyperglycemia and hypoglycemia with causes and effects; identify blood glucose results as high, low or in control; list steps in treating and preventing high and low blood glucose  Sick Day Guidelines: state appropriate measure to manage blood glucose when ill (need for meds, HBGM plan, when to call physician, need for fluids)  Chronic Complications/Foot, Skin, Eye Dental Care - identify possible long-term complications of diabetes (retinopathy, neuropathy, nephropathy, cardiovascular disease, infections); explain steps in prevention and treatment of chronic complications; state importance of daily self-foot exams; describe how to examine feet and what to look for; explain appropriate eye and dental care  Lifestyle Changes/Goals - state benefits of making appropriate lifestyle changes; identify habits that need to change (meals, tobacco, alcohol); identify strategies to reduce risk factors for personal health  Pregnancy/Sexual Health - state importance of good blood glucose control in preventing sexual problems (impotence, vaginal dryness, infections, loss of desire)  Scheduled class 3 on 05-17-18  Teaching Materials Used: Class 2 Slide Packet A1C Pamphlet Foot Care Literature Kidney Test Handout Stroke Card Quick and  "Balanced" Meal Ideas Carb Counting and Meal Planning Book Goals for Class 2

## 2018-04-26 ENCOUNTER — Encounter: Payer: Self-pay | Admitting: Dietician

## 2018-04-26 DIAGNOSIS — N8110 Cystocele, unspecified: Secondary | ICD-10-CM | POA: Diagnosis not present

## 2018-04-26 DIAGNOSIS — K660 Peritoneal adhesions (postprocedural) (postinfection): Secondary | ICD-10-CM | POA: Diagnosis not present

## 2018-04-26 DIAGNOSIS — N812 Incomplete uterovaginal prolapse: Secondary | ICD-10-CM | POA: Diagnosis not present

## 2018-04-26 NOTE — Progress Notes (Signed)
Called pt and left message that class 3 has been rescheduled on 05-24-18.

## 2018-05-13 DIAGNOSIS — Z6822 Body mass index (BMI) 22.0-22.9, adult: Secondary | ICD-10-CM | POA: Diagnosis not present

## 2018-05-13 DIAGNOSIS — A084 Viral intestinal infection, unspecified: Secondary | ICD-10-CM | POA: Diagnosis not present

## 2018-05-17 ENCOUNTER — Ambulatory Visit: Payer: 59

## 2018-05-17 DIAGNOSIS — M9901 Segmental and somatic dysfunction of cervical region: Secondary | ICD-10-CM | POA: Diagnosis not present

## 2018-05-17 DIAGNOSIS — M5033 Other cervical disc degeneration, cervicothoracic region: Secondary | ICD-10-CM | POA: Diagnosis not present

## 2018-05-17 DIAGNOSIS — M9903 Segmental and somatic dysfunction of lumbar region: Secondary | ICD-10-CM | POA: Diagnosis not present

## 2018-05-25 ENCOUNTER — Encounter: Payer: Self-pay | Admitting: Dietician

## 2018-05-25 NOTE — Progress Notes (Signed)
Class 3 rescheduled on 06-14-18

## 2018-05-26 DIAGNOSIS — M9901 Segmental and somatic dysfunction of cervical region: Secondary | ICD-10-CM | POA: Diagnosis not present

## 2018-05-26 DIAGNOSIS — M5033 Other cervical disc degeneration, cervicothoracic region: Secondary | ICD-10-CM | POA: Diagnosis not present

## 2018-05-26 DIAGNOSIS — M9903 Segmental and somatic dysfunction of lumbar region: Secondary | ICD-10-CM | POA: Diagnosis not present

## 2018-06-14 ENCOUNTER — Other Ambulatory Visit: Payer: Self-pay

## 2018-06-14 ENCOUNTER — Encounter: Payer: Self-pay | Admitting: Dietician

## 2018-06-14 ENCOUNTER — Encounter: Payer: 59 | Attending: Surgery | Admitting: Dietician

## 2018-06-14 VITALS — BP 102/70 | Ht 66.0 in | Wt 147.5 lb

## 2018-06-14 DIAGNOSIS — E119 Type 2 diabetes mellitus without complications: Secondary | ICD-10-CM

## 2018-06-14 DIAGNOSIS — Z6824 Body mass index (BMI) 24.0-24.9, adult: Secondary | ICD-10-CM | POA: Insufficient documentation

## 2018-06-14 DIAGNOSIS — Z713 Dietary counseling and surveillance: Secondary | ICD-10-CM | POA: Diagnosis not present

## 2018-06-14 DIAGNOSIS — E1165 Type 2 diabetes mellitus with hyperglycemia: Secondary | ICD-10-CM | POA: Diagnosis present

## 2018-06-14 NOTE — Progress Notes (Signed)

## 2018-06-16 ENCOUNTER — Encounter: Payer: Self-pay | Admitting: *Deleted

## 2018-06-16 DIAGNOSIS — E1165 Type 2 diabetes mellitus with hyperglycemia: Secondary | ICD-10-CM | POA: Diagnosis not present

## 2018-06-18 ENCOUNTER — Other Ambulatory Visit: Payer: Self-pay

## 2018-06-22 DIAGNOSIS — R768 Other specified abnormal immunological findings in serum: Secondary | ICD-10-CM | POA: Diagnosis not present

## 2018-06-23 DIAGNOSIS — F17200 Nicotine dependence, unspecified, uncomplicated: Secondary | ICD-10-CM | POA: Insufficient documentation

## 2018-06-23 DIAGNOSIS — E119 Type 2 diabetes mellitus without complications: Secondary | ICD-10-CM | POA: Diagnosis not present

## 2018-07-23 DIAGNOSIS — Z01818 Encounter for other preprocedural examination: Secondary | ICD-10-CM | POA: Diagnosis not present

## 2018-07-23 DIAGNOSIS — E119 Type 2 diabetes mellitus without complications: Secondary | ICD-10-CM | POA: Diagnosis not present

## 2018-07-23 DIAGNOSIS — R768 Other specified abnormal immunological findings in serum: Secondary | ICD-10-CM | POA: Diagnosis not present

## 2018-07-29 DIAGNOSIS — A048 Other specified bacterial intestinal infections: Secondary | ICD-10-CM | POA: Diagnosis not present

## 2018-07-30 DIAGNOSIS — K210 Gastro-esophageal reflux disease with esophagitis: Secondary | ICD-10-CM | POA: Diagnosis not present

## 2018-08-02 ENCOUNTER — Other Ambulatory Visit: Payer: Self-pay | Admitting: Gastroenterology

## 2018-08-18 DIAGNOSIS — K219 Gastro-esophageal reflux disease without esophagitis: Secondary | ICD-10-CM | POA: Diagnosis not present

## 2018-08-18 DIAGNOSIS — K5900 Constipation, unspecified: Secondary | ICD-10-CM | POA: Diagnosis not present

## 2018-11-14 ENCOUNTER — Encounter (HOSPITAL_COMMUNITY)
Admission: EM | Disposition: A | Discharge status: Home / Self Care | Payer: Self-pay | Source: Home / Self Care | Attending: Internal Medicine

## 2018-11-14 ENCOUNTER — Emergency Department (HOSPITAL_COMMUNITY): Payer: 59

## 2018-11-14 ENCOUNTER — Inpatient Hospital Stay (HOSPITAL_COMMUNITY)
Admission: EM | Admit: 2018-11-14 | Discharge: 2018-11-16 | DRG: 246 | Disposition: A | Discharge status: Home / Self Care | Payer: 59 | Attending: Internal Medicine | Admitting: Internal Medicine

## 2018-11-14 ENCOUNTER — Encounter (HOSPITAL_COMMUNITY): Payer: Self-pay | Admitting: Emergency Medicine

## 2018-11-14 ENCOUNTER — Other Ambulatory Visit: Payer: Self-pay

## 2018-11-14 DIAGNOSIS — Z79899 Other long term (current) drug therapy: Secondary | ICD-10-CM

## 2018-11-14 DIAGNOSIS — I25110 Atherosclerotic heart disease of native coronary artery with unstable angina pectoris: Secondary | ICD-10-CM

## 2018-11-14 DIAGNOSIS — R079 Chest pain, unspecified: Secondary | ICD-10-CM | POA: Diagnosis present

## 2018-11-14 DIAGNOSIS — Z20828 Contact with and (suspected) exposure to other viral communicable diseases: Secondary | ICD-10-CM | POA: Diagnosis present

## 2018-11-14 DIAGNOSIS — Z7982 Long term (current) use of aspirin: Secondary | ICD-10-CM

## 2018-11-14 DIAGNOSIS — I5021 Acute systolic (congestive) heart failure: Secondary | ICD-10-CM

## 2018-11-14 DIAGNOSIS — E118 Type 2 diabetes mellitus with unspecified complications: Secondary | ICD-10-CM

## 2018-11-14 DIAGNOSIS — I2510 Atherosclerotic heart disease of native coronary artery without angina pectoris: Secondary | ICD-10-CM | POA: Diagnosis present

## 2018-11-14 DIAGNOSIS — R110 Nausea: Secondary | ICD-10-CM | POA: Diagnosis present

## 2018-11-14 DIAGNOSIS — I200 Unstable angina: Secondary | ICD-10-CM | POA: Diagnosis present

## 2018-11-14 DIAGNOSIS — Z7984 Long term (current) use of oral hypoglycemic drugs: Secondary | ICD-10-CM

## 2018-11-14 DIAGNOSIS — I214 Non-ST elevation (NSTEMI) myocardial infarction: Secondary | ICD-10-CM

## 2018-11-14 DIAGNOSIS — E119 Type 2 diabetes mellitus without complications: Secondary | ICD-10-CM | POA: Diagnosis present

## 2018-11-14 DIAGNOSIS — I2102 ST elevation (STEMI) myocardial infarction involving left anterior descending coronary artery: Secondary | ICD-10-CM | POA: Diagnosis not present

## 2018-11-14 DIAGNOSIS — Z720 Tobacco use: Secondary | ICD-10-CM

## 2018-11-14 DIAGNOSIS — I361 Nonrheumatic tricuspid (valve) insufficiency: Secondary | ICD-10-CM | POA: Diagnosis not present

## 2018-11-14 DIAGNOSIS — F17210 Nicotine dependence, cigarettes, uncomplicated: Secondary | ICD-10-CM | POA: Diagnosis present

## 2018-11-14 DIAGNOSIS — Z955 Presence of coronary angioplasty implant and graft: Secondary | ICD-10-CM

## 2018-11-14 HISTORY — PX: CORONARY STENT INTERVENTION: CATH118234

## 2018-11-14 HISTORY — DX: Cystocele, unspecified: N81.10

## 2018-11-14 HISTORY — PX: CORONARY/GRAFT ACUTE MI REVASCULARIZATION: CATH118305

## 2018-11-14 HISTORY — PX: LEFT HEART CATH AND CORONARY ANGIOGRAPHY: CATH118249

## 2018-11-14 LAB — D-DIMER, QUANTITATIVE: D-Dimer, Quant: <0.27 ug{FEU}/mL (ref 0.00–0.50)

## 2018-11-14 LAB — CREATININE, SERUM
Creatinine, Ser: 0.75 mg/dL (ref 0.44–1.00)
GFR calc Af Amer: >60 mL/min (ref >60)
GFR calc non Af Amer: >60 mL/min (ref >60)

## 2018-11-14 LAB — CBC
HCT: 45 % (ref 36.0–46.0)
HCT: 40.5 % (ref 36.0–46.0)
Hemoglobin: 14.4 g/dL (ref 12.0–15.0)
Hemoglobin: 13.4 g/dL (ref 12.0–15.0)
MCH: 29.5 pg (ref 26.0–34.0)
MCH: 30.5 pg (ref 26.0–34.0)
MCHC: 32 g/dL (ref 30.0–36.0)
MCHC: 33.1 g/dL (ref 30.0–36.0)
MCV: 92.2 fL (ref 80.0–100.0)
MCV: 92 fL (ref 80.0–100.0)
Platelets: 283 K/uL (ref 150–400)
Platelets: 276 10*3/uL (ref 150–400)
RBC: 4.88 MIL/uL (ref 3.87–5.11)
RBC: 4.4 MIL/uL (ref 3.87–5.11)
RDW: 12.9 % (ref 11.5–15.5)
RDW: 12.9 % (ref 11.5–15.5)
WBC: 8.2 K/uL (ref 4.0–10.5)
WBC: 12.7 10*3/uL — ABNORMAL HIGH (ref 4.0–10.5)
nRBC: 0 % (ref 0.0–0.2)
nRBC: 0 % (ref 0.0–0.2)

## 2018-11-14 LAB — BASIC METABOLIC PANEL WITH GFR
Anion gap: 10 (ref 5–15)
BUN: 19 mg/dL (ref 6–20)
CO2: 23 mmol/L (ref 22–32)
Calcium: 9.8 mg/dL (ref 8.9–10.3)
Chloride: 104 mmol/L (ref 98–111)
Creatinine, Ser: 0.57 mg/dL (ref 0.44–1.00)
GFR calc Af Amer: >60 mL/min
GFR calc non Af Amer: >60 mL/min
Glucose, Bld: 110 mg/dL — ABNORMAL HIGH (ref 70–99)
Potassium: 4.1 mmol/L (ref 3.5–5.1)
Sodium: 137 mmol/L (ref 135–145)

## 2018-11-14 LAB — TROPONIN I (HIGH SENSITIVITY)
Troponin I (High Sensitivity): 76 ng/L — ABNORMAL HIGH
Troponin I (High Sensitivity): 395 ng/L
Troponin I (High Sensitivity): 3256 ng/L (ref <18)

## 2018-11-14 LAB — APTT: aPTT: >200 seconds (ref 24–36)

## 2018-11-14 LAB — GLUCOSE, CAPILLARY: Glucose-Capillary: 102 mg/dL — ABNORMAL HIGH (ref 70–99)

## 2018-11-14 LAB — SARS CORONAVIRUS 2 BY RT PCR (HOSPITAL ORDER, PERFORMED IN ~~LOC~~ HOSPITAL LAB): SARS Coronavirus 2: NEGATIVE

## 2018-11-14 LAB — HEPARIN LEVEL (UNFRACTIONATED): Heparin Unfractionated: 1.04 IU/mL — ABNORMAL HIGH (ref 0.30–0.70)

## 2018-11-14 SURGERY — CORONARY/GRAFT ACUTE MI REVASCULARIZATION
Anesthesia: LOCAL

## 2018-11-14 MED ORDER — HEPARIN SODIUM (PORCINE) 1000 UNIT/ML IJ SOLN
INTRAMUSCULAR | Status: DC | PRN
  Administered 2018-11-14: 2500 [IU] via INTRAVENOUS
  Administered 2018-11-14: 6000 [IU] via INTRAVENOUS
  Administered 2018-11-14: 2000 [IU] via INTRAVENOUS
  Administered 2018-11-14: 4000 [IU] via INTRAVENOUS

## 2018-11-14 MED ORDER — HEPARIN (PORCINE) 25000 UT/250ML-% IV SOLN
INTRAVENOUS | Status: AC
  Filled 2018-11-14: qty 250

## 2018-11-14 MED ORDER — PANTOPRAZOLE SODIUM 20 MG PO TBEC
20.0000 mg | DELAYED_RELEASE_TABLET | Freq: Every day | ORAL | Status: DC
  Administered 2018-11-15 – 2018-11-16 (×2): 20 mg via ORAL
  Filled 2018-11-14 (×2): qty 1

## 2018-11-14 MED ORDER — SODIUM CHLORIDE 0.9% FLUSH: 3.0000 mL | Freq: Once | INTRAVENOUS | Status: DC

## 2018-11-14 MED ORDER — SODIUM CHLORIDE 0.9% FLUSH: 3.0000 mL | INTRAVENOUS | Status: DC | PRN

## 2018-11-14 MED ORDER — INSULIN ASPART 100 UNIT/ML ~~LOC~~ SOLN: 0.0000 [IU] | Freq: Every day | SUBCUTANEOUS | Status: DC

## 2018-11-14 MED ORDER — HEPARIN SODIUM (PORCINE) 1000 UNIT/ML IJ SOLN
INTRAMUSCULAR | Status: AC
  Filled 2018-11-14: qty 1

## 2018-11-14 MED ORDER — MIDAZOLAM HCL 2 MG/2ML IJ SOLN
INTRAMUSCULAR | Status: DC | PRN
  Administered 2018-11-14: 0.5 mg via INTRAVENOUS

## 2018-11-14 MED ORDER — IOHEXOL 350 MG/ML SOLN
100.0000 mL | Freq: Once | INTRAVENOUS | Status: AC | PRN
  Administered 2018-11-14: 100 mL via INTRAVENOUS

## 2018-11-14 MED ORDER — SODIUM CHLORIDE 0.9 % WEIGHT BASED INFUSION
1.5000 mL/kg/h | INTRAVENOUS | Status: AC
  Administered 2018-11-14: 1.5 mL/kg/h via INTRAVENOUS

## 2018-11-14 MED ORDER — FENTANYL CITRATE (PF) 100 MCG/2ML IJ SOLN
INTRAMUSCULAR | Status: DC | PRN
  Administered 2018-11-14 (×2): 25 ug via INTRAVENOUS

## 2018-11-14 MED ORDER — NITROGLYCERIN IN D5W 200-5 MCG/ML-% IV SOLN
INTRAVENOUS | Status: AC
  Filled 2018-11-14: qty 250

## 2018-11-14 MED ORDER — LABETALOL HCL 5 MG/ML IV SOLN: 10.0000 mg | INTRAVENOUS | Status: AC | PRN

## 2018-11-14 MED ORDER — NITROGLYCERIN IN D5W 200-5 MCG/ML-% IV SOLN
0.0000 ug/min | INTRAVENOUS | Status: DC
  Administered 2018-11-14: 5 ug/min via INTRAVENOUS

## 2018-11-14 MED ORDER — INSULIN ASPART 100 UNIT/ML ~~LOC~~ SOLN
0.0000 [IU] | Freq: Three times a day (TID) | SUBCUTANEOUS | Status: DC
  Administered 2018-11-15: 5 [IU] via SUBCUTANEOUS
  Administered 2018-11-15 – 2018-11-16 (×3): 3 [IU] via SUBCUTANEOUS

## 2018-11-14 MED ORDER — TICAGRELOR 90 MG PO TABS
ORAL_TABLET | ORAL | Status: AC
  Filled 2018-11-14: qty 2

## 2018-11-14 MED ORDER — HYDRALAZINE HCL 20 MG/ML IJ SOLN: 10.0000 mg | INTRAMUSCULAR | Status: AC | PRN

## 2018-11-14 MED ORDER — NITROGLYCERIN 1 MG/10 ML FOR IR/CATH LAB
INTRA_ARTERIAL | Status: DC | PRN
  Administered 2018-11-14: 100 ug

## 2018-11-14 MED ORDER — NITROGLYCERIN 0.4 MG SL SUBL
0.4000 mg | SUBLINGUAL_TABLET | SUBLINGUAL | Status: DC | PRN
  Administered 2018-11-14 (×4): 0.4 mg via SUBLINGUAL
  Filled 2018-11-14 (×2): qty 1

## 2018-11-14 MED ORDER — ASPIRIN EC 81 MG PO TBEC: 81.0000 mg | DELAYED_RELEASE_TABLET | Freq: Every day | ORAL | Status: DC

## 2018-11-14 MED ORDER — LIDOCAINE HCL (PF) 1 % IJ SOLN
INTRAMUSCULAR | Status: DC | PRN
  Administered 2018-11-14: 6 mL

## 2018-11-14 MED ORDER — ONDANSETRON HCL 4 MG/2ML IJ SOLN: 4.0000 mg | Freq: Four times a day (QID) | INTRAMUSCULAR | Status: DC | PRN

## 2018-11-14 MED ORDER — IOHEXOL 350 MG/ML SOLN
INTRAVENOUS | Status: DC | PRN
  Administered 2018-11-14: 21:00:00 140 mL via INTRAVENOUS

## 2018-11-14 MED ORDER — SODIUM CHLORIDE 0.9 % IV SOLN
INTRAVENOUS | Status: DC
  Administered 2018-11-14: 13:00:00 via INTRAVENOUS

## 2018-11-14 MED ORDER — NITROGLYCERIN 1 MG/10 ML FOR IR/CATH LAB
INTRA_ARTERIAL | Status: AC
  Filled 2018-11-14: qty 10

## 2018-11-14 MED ORDER — ATORVASTATIN CALCIUM 80 MG PO TABS
80.0000 mg | ORAL_TABLET | Freq: Every day | ORAL | Status: DC
  Administered 2018-11-14 – 2018-11-15 (×2): 80 mg via ORAL
  Filled 2018-11-14 (×2): qty 1

## 2018-11-14 MED ORDER — TICAGRELOR 90 MG PO TABS
90.0000 mg | ORAL_TABLET | Freq: Two times a day (BID) | ORAL | Status: DC
  Administered 2018-11-15 – 2018-11-16 (×3): 90 mg via ORAL
  Filled 2018-11-14 (×3): qty 1

## 2018-11-14 MED ORDER — MIDAZOLAM HCL 2 MG/2ML IJ SOLN
INTRAMUSCULAR | Status: AC
  Filled 2018-11-14: qty 2

## 2018-11-14 MED ORDER — MORPHINE SULFATE (PF) 2 MG/ML IV SOLN
2.0000 mg | Freq: Once | INTRAVENOUS | Status: AC
  Administered 2018-11-14: 2 mg via INTRAVENOUS
  Filled 2018-11-14: qty 1

## 2018-11-14 MED ORDER — SODIUM CHLORIDE 0.9 % IV SOLN
INTRAVENOUS | Status: AC | PRN
  Administered 2018-11-14: 500 mL via INTRAVENOUS

## 2018-11-14 MED ORDER — METOPROLOL TARTRATE 12.5 MG HALF TABLET
12.5000 mg | ORAL_TABLET | Freq: Two times a day (BID) | ORAL | Status: DC
  Administered 2018-11-15 – 2018-11-16 (×2): 12.5 mg via ORAL
  Filled 2018-11-14 (×2): qty 1

## 2018-11-14 MED ORDER — LIDOCAINE HCL (PF) 1 % IJ SOLN
INTRAMUSCULAR | Status: AC
  Filled 2018-11-14: qty 30

## 2018-11-14 MED ORDER — HEPARIN (PORCINE) IN NACL 1000-0.9 UT/500ML-% IV SOLN
INTRAVENOUS | Status: DC | PRN
  Administered 2018-11-14 (×2): 500 mL

## 2018-11-14 MED ORDER — TICAGRELOR 90 MG PO TABS
ORAL_TABLET | ORAL | Status: DC | PRN
  Administered 2018-11-14: 180 mg via ORAL

## 2018-11-14 MED ORDER — ONDANSETRON HCL 4 MG/2ML IJ SOLN
4.0000 mg | Freq: Once | INTRAMUSCULAR | Status: AC
  Administered 2018-11-14: 4 mg via INTRAVENOUS
  Filled 2018-11-14: qty 2

## 2018-11-14 MED ORDER — HEPARIN (PORCINE) IN NACL 1000-0.9 UT/500ML-% IV SOLN
INTRAVENOUS | Status: AC
  Filled 2018-11-14: qty 1000

## 2018-11-14 MED ORDER — ACETAMINOPHEN 325 MG PO TABS
650.0000 mg | ORAL_TABLET | ORAL | Status: DC | PRN
  Administered 2018-11-15: 650 mg via ORAL
  Filled 2018-11-14: qty 2

## 2018-11-14 MED ORDER — SODIUM CHLORIDE 0.9% FLUSH
3.0000 mL | Freq: Two times a day (BID) | INTRAVENOUS | Status: DC
  Administered 2018-11-15 – 2018-11-16 (×2): 3 mL via INTRAVENOUS

## 2018-11-14 MED ORDER — FENTANYL CITRATE (PF) 100 MCG/2ML IJ SOLN
INTRAMUSCULAR | Status: AC
  Filled 2018-11-14: qty 2

## 2018-11-14 MED ORDER — NITROGLYCERIN IN D5W 200-5 MCG/ML-% IV SOLN
10.0000 ug/min | INTRAVENOUS | Status: DC
  Administered 2018-11-14: 10 ug/min via INTRAVENOUS

## 2018-11-14 MED ORDER — HEPARIN (PORCINE) 25000 UT/250ML-% IV SOLN
900.0000 [IU]/h | INTRAVENOUS | Status: DC
  Administered 2018-11-14: 14 [IU]/kg/h via INTRAVENOUS

## 2018-11-14 MED ORDER — VERAPAMIL HCL 2.5 MG/ML IV SOLN
INTRAVENOUS | Status: DC | PRN
  Administered 2018-11-14: 20:00:00 10 mL via INTRA_ARTERIAL

## 2018-11-14 MED ORDER — SODIUM CHLORIDE 0.9 % IV SOLN: 250.0000 mL | INTRAVENOUS | Status: DC | PRN

## 2018-11-14 MED ORDER — HEPARIN SODIUM (PORCINE) 5000 UNIT/ML IJ SOLN
60.0000 [IU]/kg | Freq: Once | INTRAMUSCULAR | Status: AC
  Administered 2018-11-14: 3900 [IU] via INTRAVENOUS
  Filled 2018-11-14: qty 1

## 2018-11-14 MED ORDER — HEPARIN SODIUM (PORCINE) 5000 UNIT/ML IJ SOLN
5000.0000 [IU] | Freq: Three times a day (TID) | INTRAMUSCULAR | Status: DC
  Administered 2018-11-15 – 2018-11-16 (×4): 5000 [IU] via SUBCUTANEOUS
  Filled 2018-11-14 (×3): qty 1

## 2018-11-14 MED ORDER — VERAPAMIL HCL 2.5 MG/ML IV SOLN
INTRAVENOUS | Status: AC
  Filled 2018-11-14: qty 2

## 2018-11-14 MED ORDER — ASPIRIN EC 81 MG PO TBEC
81.0000 mg | DELAYED_RELEASE_TABLET | Freq: Every day | ORAL | Status: DC
  Administered 2018-11-15 – 2018-11-16 (×2): 81 mg via ORAL
  Filled 2018-11-14 (×2): qty 1

## 2018-11-14 SURGICAL SUPPLY — 17 items
BALLN SAPPHIRE 2.5X12 (BALLOONS) ×2
BALLN SAPPHIRE ~~LOC~~ 3.0X12 (BALLOONS) ×1 IMPLANT
BALLOON SAPPHIRE 2.5X12 (BALLOONS) IMPLANT
CATH 5FR JL3.5 JR4 ANG PIG MP (CATHETERS) ×1 IMPLANT
CATH VISTA GUIDE 6FR XBLAD3.0 (CATHETERS) ×1 IMPLANT
DEVICE RAD COMP TR BAND LRG (VASCULAR PRODUCTS) ×1 IMPLANT
GLIDESHEATH SLEND A-KIT 6F 22G (SHEATH) ×1 IMPLANT
GUIDEWIRE INQWIRE 1.5J.035X260 (WIRE) IMPLANT
INQWIRE 1.5J .035X260CM (WIRE) ×2
KIT ENCORE 26 ADVANTAGE (KITS) ×1 IMPLANT
KIT HEART LEFT (KITS) ×2 IMPLANT
PACK CARDIAC CATHETERIZATION (CUSTOM PROCEDURE TRAY) ×2 IMPLANT
SHEATH PROBE COVER 6X72 (BAG) ×1 IMPLANT
STENT RESOLUTE ONYX 2.75X22 (Permanent Stent) ×1 IMPLANT
TRANSDUCER W/STOPCOCK (MISCELLANEOUS) ×2 IMPLANT
TUBING CIL FLEX 10 FLL-RA (TUBING) ×2 IMPLANT
WIRE ASAHI PROWATER 180CM (WIRE) ×1 IMPLANT

## 2018-11-14 NOTE — Significant Event (Signed)
Rapid Response Event Note  Overview: Chest Pain and LT ARM Numbness/Tingling   Initial Focused Assessment: Called by 6E nurses with concerns of patient having severe LT substernal chest pain coupled with periods of pain that radiates down the LT arm that causes numbness/tingling/heaviness sensation. I came up to see Casey Mason. She was quite upset, anxious, and appeared very uncomfortably. Nurses had obtained EKG (- appears NSR) and STAT Troponin was drawn as well. Patient was on NTG Infusion at 25 mcg/kg/min and Heparin Infusion @ 900 units/hr. Skin warm warm and dry, + 2 pulses, neuro exam was unremarkable, the sensation in the LT Arm seems to be coupled with her chest discomfort for what I can discern. SBP ranged from 88-120, MAP > 70, 97% on 2L [place], labored breathing at times but not in acute respiratory distress.   Interventions: -- None--   Plan of Care: -- CARDS MD came to bedside. Plan to take patient urgently to the Cardiac Cath Lab. 6E nurses will take patient to the CCL. I was called away to another emergency.   Event Summary:  Call Time [place], [place]

## 2018-11-14 NOTE — ED Notes (Signed)
Spoke with [facility] to notify bed is ready at [facility].

## 2018-11-14 NOTE — ED Triage Notes (Signed)
Patient c/o left side chest pain that has been intermittent since yesterday but became constant and progressively worse this morning at 8:30am. Patient states pain radiates into jaw and left arm. Per patient shortness of breath, dizziness, and weakness this morning. Nausea and vomiting yesterday. Patient states "I thought it was indigestion yesterday." Denies any cardiac hx.

## 2018-11-14 NOTE — CV Procedure (Signed)
   Stuttering anterior ST elevation MI starting greater than 12 hours ago.  Proximal LAD 99 % stenosis with TIMI grade II flow.  22 x 2.75 Onyx DES postdilated to 3.0 mm with TIMI grade III flow.  Large first diagonal with diffuse disease 50 to 70% stenosis from ostium to proximal.  Normal circumflex, left main, and RCA.  Mild anterior wall hypokinesis.  EF at least 50%.  LVEDP 5 mmHg.

## 2018-11-14 NOTE — Progress Notes (Addendum)
Pt arrived to unit at 1800 via [facility]. Reported 2 SL nitro given in route. Upon arrival pt in severe distress with sharp left sided pain, and heavy tingling/ numbness in both upper extremities, and SOB. On call cards MD paged and made aware, IV nitro started. Rapid response RN called and made aware. MD at bedside, cath lab team called to initate cardiac cath.

## 2018-11-14 NOTE — ED Notes (Signed)
ED Provider at bedside. 

## 2018-11-14 NOTE — ED Provider Notes (Signed)
[facility] EMERGENCY DEPARTMENT Provider Note   CSN: [id number] Arrival date & time: 11/14/18  2595     History   Chief Complaint Chief Complaint  Patient presents with  . Chest Pain    HPI Casey Mason is a 53 y.o. female.     Patient with onset of some intermittent chest pain starting on Tuesday.  This got more intense on Friday where she got a lot of pain walking from her vehicle into the office on Friday which was kind of a long walk.  Same thing occurred leaving the office walking back to the vehicle.  She had a rest in her vehicle.  This morning she awoke with fairly intense pain left-sided chest radiating to the left side of the neck and left arm at 4 in the morning.  Seemed to settle down.  Came back at 8 and is kind of remained there since.  Patient did take a full aspirin at home.  Patient has a history of diabetes.  Is a smoker.  But no known coronary artery disease.  No family history.  No upper respiratory symptoms.     Past Medical History:  Diagnosis Date  . Diabetes mellitus without complication ([place])   . Diverticulosis of colon   . Family history of adverse reaction to anesthesia    mother-- ponv  . History of colon polyps    2016- hyperplastic  . History of diverticulitis of colon   . Nephrolithiasis    right  . PONV (postoperative nausea and vomiting)   . Prolapse of female bladder, acquired   . Right ureteral stone     Patient Active Problem List   Diagnosis Date Noted  . Unstable angina ([place]) 11/14/2018  . Generalized abdominal fullness 07/21/2017  . Nausea 07/21/2017    Past Surgical History:  Procedure Laterality Date  . BLADDER SUSPENSION    . CESAREAN SECTION  1988  . COLONOSCOPY  11-10-2014  in [place]  . CYSTOSCOPY WITH RETROGRADE PYELOGRAM, URETEROSCOPY AND STENT PLACEMENT Right 02/07/2016   Procedure: CYSTOSCOPY WITH RIGHT PYLEOGRAM  RIGHT URETEROSCOPY, , DIALATION OF RIGHT URETER STRICUTURE FLEXIBLE RIGHT URETEROSCOPY, RIGHT  PYELOSCOPY,  AND RIGHT DOUBLE STENT PLACEMENT WITH TETHER;  Surgeon: Carolan Clines, MD;  Location: [place];  Service: Urology;  Laterality: Right;  . EXTRACORPOREAL SHOCK WAVE LITHOTRIPSY Right 09/20/2015  . ROTATOR CUFF REPAIR Right 2013 or 2014??  . TONSILLECTOMY  2003  . TUBAL LIGATION  1992  . VAGINAL HYSTERECTOMY  2005     OB History   No obstetric history on file.      Home Medications    Prior to Admission medications   Medication Sig Start Date End Date Taking? Authorizing Provider  Apple Cider Vinegar 500 MG TABS Take 500 mg by mouth 2 (two) times daily.     [provider]  aspirin 81 MG tablet Take 81 mg by mouth daily.    [provider]  Cholecalciferol (VITAMIN D3) 2000 units TABS Take 1 tablet by mouth daily.    [provider]  Cinnamon 500 MG capsule Take 500 mg by mouth daily.    [provider]  COLLAGEN PO Take 1,000 mg by mouth 2 (two) times daily.    [provider]  empagliflozin (JARDIANCE) 25 MG TABS tablet Take 25 mg by mouth daily. 01/21/18   [provider]  ibandronate (BONIVA) 150 MG tablet Take 150 mg by mouth every 30 (thirty) days. 02/04/18   [provider]  magnesium oxide (MAG-OX) 400 MG tablet Take 400 mg by mouth daily.    [provider]  metFORMIN (GLUCOPHAGE) 500 MG tablet Take 1,000 mg by mouth 2 (two) times daily.    [provider]  Multiple Vitamin (MULTIVITAMIN) tablet Take 1 tablet by mouth daily.    [provider]  naproxen sodium (ANAPROX) 220 MG tablet Take 220 mg by mouth daily as needed (pain).     [provider]  pantoprazole (PROTONIX) 40 MG tablet TAKE 1 TABLET BY MOUTH TWICE A DAY 08/02/18   Ladene Artist, MD  polyethylene glycol powder (MIRALAX) powder Take 1 Container by mouth 2 (two) times daily as needed for mild constipation or moderate constipation.     [provider]  vitamin B-12  (CYANOCOBALAMIN) 100 MCG tablet Take 100 mcg by mouth daily.    [provider]    Family History Family History  Problem Relation Age of Onset  . Anemia Mother   . Stomach cancer Neg Hx   . Esophageal cancer Neg Hx     Social History Social History   Tobacco Use  . Smoking status: Current Every Day Smoker    Packs/day: 0.50    Years: 15.00    Pack years: 7.50    Types: Cigarettes  . Smokeless tobacco: Never Used  Substance Use Topics  . Alcohol use: Yes    Alcohol/week: 0.0 standard drinks    Comment: occasional  . Drug use: No     Allergies   Oxycodone and Sulfa antibiotics   Review of Systems Review of Systems  Constitutional: Negative for chills and fever.  HENT: Negative for congestion, rhinorrhea and sore throat.   Eyes: Negative for visual disturbance.  Respiratory: Negative for cough and shortness of breath.   Cardiovascular: Positive for chest pain. Negative for leg swelling.  Gastrointestinal: Negative for abdominal pain, diarrhea, nausea and vomiting.  Genitourinary: Negative for dysuria.  Musculoskeletal: Negative for back pain and neck pain.  Skin: Negative for rash.  Neurological: Negative for dizziness, light-headedness and headaches.  Hematological: Does not bruise/bleed easily.  Psychiatric/Behavioral: Negative for confusion.     Physical Exam Updated Vital Signs BP 100/66   Pulse 79   Temp 98.6 F (37 C)   Resp 20   Ht 1.676 m (5\' 6" )   Wt 64.9 kg   SpO2 98%   BMI 23.08 kg/m   Physical Exam Vitals signs and nursing note reviewed.  Constitutional:      General: She is in acute distress.     Appearance: Normal appearance. She is well-developed.  HENT:     Head: Normocephalic and atraumatic.  Eyes:     Extraocular Movements: Extraocular movements intact.     Conjunctiva/sclera: Conjunctivae normal.     Pupils: Pupils are equal, round, and reactive to light.  Neck:     Musculoskeletal: Normal range of motion and neck  supple.  Cardiovascular:     Rate and Rhythm: Normal rate and regular rhythm.     Heart sounds: No murmur.  Pulmonary:     Effort: Pulmonary effort is normal. No respiratory distress.     Breath sounds: Normal breath sounds.  Abdominal:     Palpations: Abdomen is soft.     Tenderness: There is no abdominal tenderness.  Musculoskeletal: Normal range of motion.  Skin:    General: Skin is warm and dry.     Capillary Refill: Capillary refill takes less than 2 seconds.  Neurological:  General: No focal deficit present.     Mental Status: She is alert and oriented to person, place, and time.      ED Treatments / Results  Labs (all labs ordered are listed, but only abnormal results are displayed) Labs Reviewed  BASIC METABOLIC PANEL - Abnormal; Notable for the following components:      Result Value   Glucose, Bld 110 (*)    All other components within normal limits  TROPONIN I (HIGH SENSITIVITY) - Abnormal; Notable for the following components:   Troponin I (High Sensitivity) 76 (*)    All other components within normal limits  TROPONIN I (HIGH SENSITIVITY) - Abnormal; Notable for the following components:   Troponin I (High Sensitivity) 395 (*)    All other components within normal limits  SARS CORONAVIRUS 2 (HOSPITAL ORDER, [place] [facility])  CBC  D-DIMER, QUANTITATIVE (NOT AT [facility])    EKG EKG Interpretation  Date/Time:  Sunday November 14 2018 10:46:55 EDT Ventricular Rate:  73 PR Interval:    QRS Duration: 89 QT Interval:  374 QTC Calculation: 413 R Axis:   57 Text Interpretation:  Sinus rhythm Atrial premature complex Improved From previous Confirmed by Fredia Sorrow [phone]) on 11/14/2018 11:02:24 AM   Radiology Dg Chest 2 View  Result Date: 11/14/2018 CLINICAL DATA:  Left-sided chest pain beginning yesterday which radiates to the left arm and jaw. Shortness of breath. Dizziness. Nausea and vomiting. EXAM: CHEST - 2 VIEW COMPARISON:   None. FINDINGS: Patient is rotated to the left. Heart size is normal. Pulmonary hyperinflation is noted however both lungs are clear. No evidence of pleural effusion. Thoracic dextroscoliosis noted. IMPRESSION: Pulmonary hyperinflation. No active cardiopulmonary disease. Electronically Signed   By: Marlaine Hind M.D.   On: 11/14/2018 10:20    Procedures Procedures (including critical care time)  CRITICAL CARE Performed by: Fredia Sorrow Total critical care time: 45 minutes Critical care time was exclusive of separately billable procedures and treating other patients. Critical care was necessary to treat or prevent imminent or life-threatening deterioration. Critical care was time spent personally by me on the following activities: development of treatment plan with patient and/or surrogate as well as nursing, discussions with consultants, evaluation of patient's response to treatment, examination of patient, obtaining history from patient or surrogate, ordering and performing treatments and interventions, ordering and review of laboratory studies, ordering and review of radiographic studies, pulse oximetry and re-evaluation of patient's condition.   Medications Ordered in ED Medications  nitroGLYCERIN (NITROSTAT) SL tablet 0.4 mg (0.4 mg Sublingual Given 11/14/18 1129)  0.9 %  sodium chloride infusion ( Intravenous New Bag/Given 11/14/18 1320)  heparin ADULT infusion 100 units/mL (25000 units/260mL sodium chloride 0.45%) (has no administration in time range)  heparin injection 3,900 Units (has no administration in time range)  ondansetron (ZOFRAN) injection 4 mg (4 mg Intravenous Given 11/14/18 1120)  morphine 2 MG/ML injection 2 mg (2 mg Intravenous Given 11/14/18 1130)  iohexol (OMNIPAQUE) 350 MG/ML injection 100 mL (100 mLs Intravenous Contrast Given 11/14/18 1250)     Initial Impression / Assessment and Plan / ED Course  I have reviewed the triage vital signs and the nursing notes.   Pertinent labs & imaging results that were available during my care of the patient were reviewed by me and considered in my medical decision making (see chart for details).       Patient's story initially concerning for an unstable angina pattern.  Initial EKG had a wandering baseline  and some questionable ST segment depression inferiorly.  Repeat while she still had the same degree of pain showed no concerns.  Initial troponin was in the 70s.  Patient's heart score although not calculated would probably be elevated.  The second troponin I is high as 395.  This obviously raise concerns cardiology contacted.  They want patient transferred for admission down to [facility].  Patient will be started on heparin.  Patient did take an aspirin a full aspirin this morning.  When she got here patient was given nitroglycerin and 2 mg of morphine with significant improvement in the pain but still had a little bit of tightness in the chest.  Before the second troponin was back CT Angie of the chest was ordered just to rule out pulmonary embolus.  Results of that are still pending.  Patient started on heparin.  Ce[facility]in cardiology at [facility] is Dr. Crissie Sickles.  Patient be transported by [employer].   Final Clinical Impressions(s) / ED Diagnoses   Final diagnoses:  Unstable angina [facility])    ED Discharge Orders    None       Fredia Sorrow, MD 11/14/18 1337

## 2018-11-14 NOTE — Progress Notes (Signed)
Chaplain responded to STEMI. Husband was bedside.  Pt appeared to be in pain and distress, at times teary.  Provided orientation and emotional support. Pt requested and chaplain provided prayer.    Please contact as further support is needed.    Luana Shu [phone]     11/14/18 2000  Clinical Encounter Type  Visited With Patient and family together  Visit Type Initial (STEMI)  Referral From  (Care team)  Consult/Referral To Chaplain  Spiritual Encounters  Spiritual Needs Prayer  Stress Factors  Patient Stress Factors Health changes  Family Stress Factors Lack of knowledge

## 2018-11-14 NOTE — H&P (Signed)
Cardiology History & Physical    Patient ID: Casey Mason MRN: [id number], DOB/AGE: [birth year]   Admit date: 11/14/2018  Primary Physician: [name], MD Primary Cardiologist: No primary care provider on file.  Patient Profile    [name] is a 53 year old woman with a history of current tobacco use and type 2 DM but no prior cardiovascular history presenting with chest pain.   History of Present Illness    For the past week she has noticed relatively mild transient chest pain episodes that can come on at any time. On Friday (2 days ago) she was walking into work and when she arrived began to have more severe chest pain that eventually was relieved with rest but did cause her to miss a meeting. The same thing happened again on the long walk back to her car. This morning she was awoken by fairly severe substernal chest pain at 4am then again at 8am with radiation to her neck and left arm. The pain did not abate this time so she took a full dose aspirin at home and came to the [facility] ED.   Initial ECG around 10am is limited by poor quality but shows significant inferior ST depression, more subtle V5-V6, as well as ST elevation in V2 and possibly V1, aVR, aVL, however there is marked baseline sway in these leads. Repeat ECG about 1 hour later showed some persistent ST depression inferiorly, though other ST changes had resolved. Troponin was elevated to 76, then 395. Her pain did improve with nitroglycerin and morphine but never completely resolved. Despite an undetectable d-dimer, CTA was done that was negative for PE. After speaking with cardiology here, she was started on heparin and transferred to [facility] for further management.   She arrived around 6pm with significant ongoing chest pain despite receiving 2 SL nitroglycerin en route. ECG showed resolution of her ST changes. She was started on nitro infusion titrated up as blood pressure would tolerate but continued to have chest pain  with left arm numbness. On my evaluation the pain did not seem pleuritic or positional. Case discussed with Dr. [name] and decision was made to proceed with emergent catheterization which showed subtotal occlusion of the proximal LAD with TIMI II flow, successfully treated with Onyx DES and IC nitro with complete resolution of her chest pain. There was otherwise moderate non-obstructive disease in D1. She was taken to the ICU in stable condition.   Past Medical History   Past Medical History:  Diagnosis Date   Diabetes mellitus without complication ([place])    Diverticulosis of colon    Family history of adverse reaction to anesthesia    mother-- ponv   History of colon polyps    2016- hyperplastic   History of diverticulitis of colon    Nephrolithiasis    right   PONV (postoperative nausea and vomiting)    Prolapse of female bladder, acquired    Right ureteral stone     Past Surgical History:  Procedure Laterality Date   [place]   COLONOSCOPY  11-10-2014  in [place], URETEROSCOPY AND STENT PLACEMENT Right 02/07/2016   Procedure: CYSTOSCOPY WITH RIGHT PYLEOGRAM  RIGHT URETEROSCOPY, , DIALATION OF RIGHT URETER STRICUTURE FLEXIBLE RIGHT URETEROSCOPY, RIGHT PYELOSCOPY,  AND RIGHT DOUBLE STENT PLACEMENT WITH TETHER;  Surgeon: Carolan Clines, MD;  Location: [place];  Service: Urology;  Laterality: Right;  EXTRACORPOREAL SHOCK WAVE LITHOTRIPSY Right 09/20/2015   ROTATOR CUFF REPAIR Right 2013 or 2014??   TONSILLECTOMY  2003   TUBAL LIGATION  1992   VAGINAL HYSTERECTOMY  2005     Allergies Allergies  Allergen Reactions   Oxycodone Itching   Sulfa Antibiotics Itching    Home Medications    Prior to Admission medications   Medication Sig Start Date End Date Taking? Authorizing Provider  Apple Cider Vinegar 500 MG TABS Take 500 mg by mouth 2 (two) times daily.    Yes  [provider]  aspirin 81 MG tablet Take 81 mg by mouth daily.   Yes [provider]  Cholecalciferol (VITAMIN D3) 2000 units TABS Take 2 tablets by mouth daily.    Yes [provider]  Cinnamon 500 MG capsule Take 1,000 mg by mouth daily.    Yes [provider]  Coenzyme Q10 100 MG capsule Take 100 mg by mouth daily.   Yes [provider]  empagliflozin (JARDIANCE) 25 MG TABS tablet Take 25 mg by mouth daily. 01/21/18  Yes [provider]  magnesium oxide (MAG-OX) 400 MG tablet Take 400 mg by mouth daily.   Yes [provider]  meloxicam (MOBIC) 15 MG tablet Take 15 mg by mouth daily as needed for pain.   Yes [provider]  metFORMIN (GLUCOPHAGE-XR) 500 MG 24 hr tablet Take 1,000 mg by mouth 2 (two) times daily.   Yes [provider]  Multiple Vitamin (MULTIVITAMIN) tablet Take 1 tablet by mouth daily.   Yes [provider]  naproxen sodium (ANAPROX) 220 MG tablet Take 220 mg by mouth daily as needed (pain).    Yes [provider]  pantoprazole (PROTONIX) 40 MG tablet TAKE 1 TABLET BY MOUTH TWICE A DAY Patient taking differently: Take 20 mg by mouth daily.  08/02/18  Yes Ladene Artist, MD  polyethylene glycol powder (MIRALAX) powder Take 1 Container by mouth 2 (two) times daily as needed for mild constipation or moderate constipation.    Yes [provider]  vitamin B-12 (CYANOCOBALAMIN) 100 MCG tablet Take 100 mcg by mouth daily.   Yes [provider]  COLLAGEN PO Take 1,000 mg by mouth 2 (two) times daily.    [provider]  ibandronate (BONIVA) 150 MG tablet Take 150 mg by mouth every 30 (thirty) days. 02/04/18   [provider]  metFORMIN (GLUCOPHAGE) 500 MG tablet Take 1,000 mg by mouth 2 (two) times daily.    [provider]    Family History    Family History  Problem Relation Age of Onset   Anemia Mother    Stomach cancer Neg Hx     Esophageal cancer Neg Hx    She indicated that her mother is alive. She indicated that her father is deceased. She indicated that the status of her neg hx is unknown.   Social History    Social History   Socioeconomic History   Marital status: Married    Spouse name: Not on file   Number of children: Not on file   Years of education: Not on file   Highest education level: Not on file  Occupational History   Not on file  Social Needs   Financial resource strain: Not on file   Food insecurity    Worry: Not on file    Inability: Not on file   Transportation needs    Medical: Not on file    Non-medical: Not on  file  Tobacco Use   Smoking status: Current Every Day Smoker    Packs/day: 0.50    Years: 15.00    Pack years: 7.50    Types: Cigarettes   Smokeless tobacco: Never Used  Substance and Sexual Activity   Alcohol use: Yes    Alcohol/week: 0.0 standard drinks    Comment: occasional   Drug use: No   Sexual activity: Not on file  Lifestyle   Physical activity    Days per week: Not on file    Minutes per session: Not on file   Stress: Not on file  Relationships   Social connections    Talks on phone: Not on file    Gets together: Not on file    Attends religious service: Not on file    Active member of club or organization: Not on file    Attends meetings of clubs or organizations: Not on file    Relationship status: Not on file   Intimate partner violence    Fear of current or ex partner: Not on file    Emotionally abused: Not on file    Physically abused: Not on file    Forced sexual activity: Not on file  Other Topics Concern   Not on file  Social History Narrative   Not on file     Review of Systems    General:  No chills, fever, night sweats or weight changes.  Cardiovascular:  See HPI Dermatological: No rash, lesions/masses Respiratory: No cough, dyspnea Urologic: No hematuria, dysuria Abdominal:   No nausea, vomiting, diarrhea,  bright red blood per rectum, melena, or hematemesis Neurologic:  No visual changes, wkns, changes in mental status. All other systems reviewed and are otherwise negative except as noted above.  Physical Exam    BP 109/68    Pulse 83    Temp 97.7 F (36.5 C) (Oral)    Resp (!) 22    Ht 5\' 6"  (1.676 m)    Wt 64.9 kg    SpO2 96%    BMI 23.08 kg/m  General: Alert, uncomfortable appearing, tearful, and visibly upset  HEENT: Normal Neck: No bruits or JVD. Lungs:  Resp regular and unlabored, CTA bilaterally. Heart: Regular rhythm, no s3, s4, or murmurs. Abdomen: Soft, non-tender, non-distended, BS +.  Extremities: Warm. No clubbing, cyanosis or edema. DP/PT/Radials 2+ and equal bilaterally. Neuro: Alert and oriented. No gross focal deficits. No abnormal movements.  Labs    HS Troponins: 76->395->3256  Lab Results  Component Value Date   WBC 8.2 11/14/2018   HGB 14.4 11/14/2018   HCT 45.0 11/14/2018   MCV 92.2 11/14/2018   PLT 283 11/14/2018    Recent Labs  Lab 11/14/18 1027  NA 137  K 4.1  CL 104  CO2 23  BUN 19  CREATININE 0.57  CALCIUM 9.8  GLUCOSE 110*   No results found for: CHOL, HDL, LDLCALC, TRIG Lab Results  Component Value Date   DDIMER <0.27 11/14/2018     Radiology Studies    Dg Chest 2 View  Result Date: 11/14/2018 CLINICAL DATA:  Left-sided chest pain beginning yesterday which radiates to the left arm and jaw. Shortness of breath. Dizziness. Nausea and vomiting. EXAM: CHEST - 2 VIEW COMPARISON:  None. FINDINGS: Patient is rotated to the left. Heart size is normal. Pulmonary hyperinflation is noted however both lungs are clear. No evidence of pleural effusion. Thoracic dextroscoliosis noted. IMPRESSION: Pulmonary hyperinflation. No active cardiopulmonary disease. Electronically Signed   By:  Marlaine Hind M.D.   On: 11/14/2018 10:20   Ct Angio Chest Pe W/cm &/or Wo Cm  Result Date: 11/14/2018 CLINICAL DATA:  Left chest pain intermittently since yesterday,  constant and progressively worse since this morning at 8:30 a.m. The pain radiates into the mandible and left arm. Shortness of breath, dizziness and weakness this morning. Nausea and vomiting yesterday. EXAM: CT ANGIOGRAPHY CHEST WITH CONTRAST TECHNIQUE: Multidetector CT imaging of the chest was performed using the standard protocol during bolus administration of intravenous contrast. Multiplanar CT image reconstructions and MIPs were obtained to evaluate the vascular anatomy. CONTRAST:  127mL OMNIPAQUE IOHEXOL 350 MG/ML SOLN COMPARISON:  Chest CT dated 10/21/2013. FINDINGS: Cardiovascular: Satisfactory opacification of the pulmonary arteries to the segmental level. No evidence of pulmonary embolism. Normal heart size. No pericardial effusion. Mediastinum/Nodes: No enlarged mediastinal, hilar, or axillary lymph nodes. Thyroid gland, trachea, and esophagus demonstrate no significant findings. Lungs/Pleura: Minimal bibasilar atelectasis. Otherwise, clear lungs. Small left lower lobe calcified granuloma. Small right lower lobe calcified granuloma. No pleural fluid. Upper Abdomen: Unremarkable. Musculoskeletal: Mild-to-moderate scoliosis. Mild midthoracic lordosis. Mild thoracic spine degenerative changes. Review of the MIP images confirms the above findings. IMPRESSION: No pulmonary emboli or acute abnormality. Electronically Signed   By: Claudie Revering M.D.   On: 11/14/2018 13:29    ECG & Cardiac Imaging    ECG 09:52: Poor baseline limits interpretation. NSR, RAE, Inferior/anterolateral ST depression, 48mm ST elevation V2 and possibly V1, aVR and/or aVL though these leads have significant baseline sway - personally reviewed. ECG 10:46: NSR, persistent inferior ST depression but improved, non-specific T-wave flattening, ST elevation has resolved ECG 18:21: NSR, ST segments have normalized, there is now mild TWI in V1-V3  All ECGs were personally reviewed and interpreted.   Assessment & Plan   Amily Depp is  a 53 year old woman with a history of current tobacco use and type 2 DM but no prior cardiovascular history presenting with accelerating anginal chest pain for the past week progressing to unrelenting chest pain at rest, found to have a high-risk NSTEMI with subtotal proximal LAD occlusion. LAD territory appears hypokinetic, though LVEDP is normal. She has moderate D1 disease but non-obstructive.   High-risk NSTEMI s/p PCI to proximal LAD:  - TTE, A1c, lipids, and repeat ECG in the morning - Monitor on telemetry - ASA and ticagrelor for at least 1 year; ticagrelor loaded in cath lab - Start atorvastatin 80mg  daily - Continue nitro ggt overnight - Start low-dose metoprolol tomorrow - Discussed smoking cessation, continue to address - Cardiac rehab at discharge  Type 2 DM: last A1c was 6.5%, currently normoglycemic. Follows with endocrinology. - Hold metformin post-cath and Jardiance (non-formulary) - very appropriate long-term regimen in the setting of cardiovascular disease. - SSI TIDAC only for now - Check A1c  Nutrition: Consistent Carb diet DVT ppx: heparin SQ GI ppx: n/a Advanced Care Planning: Full Code   Signed, Marykay Lex, MD 11/14/2018, 7:53 PM

## 2018-11-14 NOTE — Progress Notes (Addendum)
ANTICOAGULATION CONSULT NOTE - Initial Consult  Pharmacy Consult for heparin dosing Indication: ACS/STEMI  Allergies  Allergen Reactions  . Oxycodone Itching  . Sulfa Antibiotics Itching    Patient Measurements: Height: 5\' 6"  (167.6 cm) Weight: 143 lb (64.9 kg) IBW/kg (Calculated) : 59.3 Heparin Dosing Weight:  HEPARIN DW (KG): 64.9  Vital Signs: Temp: 98.6 F (37 C) (08/16 0952) BP: 100/66 (08/16 1300) Pulse Rate: 79 (08/16 1315)  Labs: Recent Labs    11/14/18 1027 11/14/18 1145  HGB 14.4  --   HCT 45.0  --   PLT 283  --   CREATININE 0.57  --   TROPONINIHS 76* 395*    Estimated Creatinine Clearance: 75.3 mL/min (by C-G formula based on SCr of 0.57 mg/dL).   Medical History: Past Medical History:  Diagnosis Date  . Diabetes mellitus without complication ([place])   . Diverticulosis of colon   . Family history of adverse reaction to anesthesia    mother-- ponv  . History of colon polyps    2016- hyperplastic  . History of diverticulitis of colon   . Nephrolithiasis    right  . PONV (postoperative nausea and vomiting)   . Prolapse of female bladder, acquired   . Right ureteral stone     Assessment: Pharmacy consulted to dose heparin infusion for this 53 yo female with ACS/STEMI.  CBC is WNL.  She has been on no prior anti-coagulation.  Baseline aPTT and heparin levels are high, as labs were drawn after bolus was given and drip was hanging.  Goal of Therapy:  Heparin level 0.3-0.7 units/ml Monitor platelets by anticoagulation protocol: Yes   Plan:  Give 3900 units bolus x 1 Start heparin infusion at 900 units/hr Check anti-Xa level in 6-8 hours and daily while on heparin Continue to monitor H&H and platelets    Despina Pole, Pharm. D. Clinical Pharmacist 11/14/2018 1:47 PM

## 2018-11-14 NOTE — ED Notes (Signed)
Report given to [facility] with [facility].

## 2018-11-14 NOTE — ED Notes (Signed)
CRITICAL VALUE ALERT  Critical Value:  Trop 395  Date & Time Notied:  1250, 11/14/18  Provider Notified: Dr. Rogene Houston  Orders Received/Actions taken: no new orders

## 2018-11-15 ENCOUNTER — Other Ambulatory Visit: Payer: Self-pay

## 2018-11-15 ENCOUNTER — Inpatient Hospital Stay (HOSPITAL_COMMUNITY): Payer: 59

## 2018-11-15 ENCOUNTER — Encounter (HOSPITAL_COMMUNITY): Payer: Self-pay

## 2018-11-15 DIAGNOSIS — I361 Nonrheumatic tricuspid (valve) insufficiency: Secondary | ICD-10-CM

## 2018-11-15 LAB — BASIC METABOLIC PANEL
Anion gap: 6 (ref 5–15)
BUN: 16 mg/dL (ref 6–20)
CO2: 23 mmol/L (ref 22–32)
Calcium: 9.1 mg/dL (ref 8.9–10.3)
Chloride: 110 mmol/L (ref 98–111)
Creatinine, Ser: 0.76 mg/dL (ref 0.44–1.00)
GFR calc Af Amer: >60 mL/min (ref >60)
GFR calc non Af Amer: >60 mL/min (ref >60)
Glucose, Bld: 188 mg/dL — ABNORMAL HIGH (ref 70–99)
Potassium: 4.2 mmol/L (ref 3.5–5.1)
Sodium: 139 mmol/L (ref 135–145)

## 2018-11-15 LAB — CBC
HCT: 39.7 % (ref 36.0–46.0)
Hemoglobin: 13 g/dL (ref 12.0–15.0)
MCH: 30.3 pg (ref 26.0–34.0)
MCHC: 32.7 g/dL (ref 30.0–36.0)
MCV: 92.5 fL (ref 80.0–100.0)
Platelets: 266 10*3/uL (ref 150–400)
RBC: 4.29 MIL/uL (ref 3.87–5.11)
RDW: 13.1 % (ref 11.5–15.5)
WBC: 8.6 10*3/uL (ref 4.0–10.5)
nRBC: 0 % (ref 0.0–0.2)

## 2018-11-15 LAB — LIPID PANEL
Cholesterol: 138 mg/dL (ref 0–200)
HDL: 41 mg/dL (ref >40)
LDL Cholesterol: 79 mg/dL (ref 0–99)
Total CHOL/HDL Ratio: 3.4 RATIO
Triglycerides: 89 mg/dL (ref <150)
VLDL: 18 mg/dL (ref 0–40)

## 2018-11-15 LAB — GLUCOSE, CAPILLARY
Glucose-Capillary: 130 mg/dL — ABNORMAL HIGH (ref 70–99)
Glucose-Capillary: 172 mg/dL — ABNORMAL HIGH (ref 70–99)
Glucose-Capillary: 52 mg/dL — ABNORMAL LOW (ref 70–99)
Glucose-Capillary: 223 mg/dL — ABNORMAL HIGH (ref 70–99)
Glucose-Capillary: 149 mg/dL — ABNORMAL HIGH (ref 70–99)

## 2018-11-15 LAB — MRSA PCR SCREENING: MRSA by PCR: NEGATIVE

## 2018-11-15 LAB — ECHOCARDIOGRAM COMPLETE
Height: 66 in
Weight: 2232.82 oz

## 2018-11-15 LAB — MAGNESIUM: Magnesium: 2.1 mg/dL (ref 1.7–2.4)

## 2018-11-15 LAB — POCT ACTIVATED CLOTTING TIME
Activated Clotting Time: 290 seconds
Activated Clotting Time: 296 seconds

## 2018-11-15 LAB — HEMOGLOBIN A1C
Hgb A1c MFr Bld: 6 % — ABNORMAL HIGH (ref 4.8–5.6)
Mean Plasma Glucose: 125.5 mg/dL

## 2018-11-15 MED ORDER — DOCUSATE SODIUM 100 MG PO CAPS
200.0000 mg | ORAL_CAPSULE | Freq: Two times a day (BID) | ORAL | Status: DC
  Administered 2018-11-15 – 2018-11-16 (×2): 200 mg via ORAL
  Filled 2018-11-15 (×2): qty 2

## 2018-11-15 NOTE — Progress Notes (Signed)
  Echocardiogram 2D Echocardiogram has been performed.  Casey Mason 11/15/2018, 9:46 AM

## 2018-11-15 NOTE — Progress Notes (Addendum)
CARDIAC REHAB PHASE I   PRE:  Rate/Rhythm: 78 SR  BP:  Supine: 107/75  Sitting:   Standing:    SaO2: 95%RA  1030-1122 Offered to walk pt or get to recliner. Pt declined at this time and stated would do later. Encouraged her to get OOB later with staff. Began ed since family in room. MI education completed except for ex ed. Reviewed the importance of brilinta with stent. Needs to see case manager. Reviewed NTG use, MI restrictions, smoking cessation, carb counting and heart healthy food choices. Pt has gotten her A1C down to almost normal per pt with weight loss and watching what she eats. Congratulated pt on her success. Encouraged smoking cessation and gave handout. Encouraged her to call [email or web address] as needed. Discussed CRP 2 and referred to [facility] program. Will follow up tomorrow for ex ed and ambulation. Pt is interested in participating in Virtual Cardiac Rehab. Pt advised that Virtual Cardiac Rehab is provided at no cost to the patient.  Checklist:  1. Pt has smart device  ie smartphone and/or ipad for downloading an app  Yes 2. Reliable internet/wifi service    Yes 3. Understands how to use their smartphone and navigate within an app.  Yes   Reviewed with pt the scheduling process for virtual cardiac rehab.  Pt verbalized understanding.   Graylon Good RN BSN 11/15/2018 11:22 AM        Graylon Good, RN BSN  11/15/2018 11:17 AM

## 2018-11-15 NOTE — Progress Notes (Signed)
Progress Note  Patient Name: Casey Mason Date of Encounter: 11/15/2018  Primary Cardiologist: Dr. [name]  Subjective   No chest pain this am. No dyspnea. No events overnight.   Inpatient Medications    Scheduled Meds: . aspirin EC  81 mg Oral Daily  . atorvastatin  80 mg Oral q1800  . heparin  5,000 Units Subcutaneous Q8H  . insulin aspart  0-15 Units Subcutaneous TID WC  . insulin aspart  0-5 Units Subcutaneous QHS  . metoprolol tartrate  12.5 mg Oral BID  . pantoprazole  20 mg Oral Daily  . sodium chloride flush  3 mL Intravenous Q12H  . ticagrelor  90 mg Oral BID   Continuous Infusions: . sodium chloride    . sodium chloride 1.5 mL/kg/hr (11/14/18 2240)  . nitroGLYCERIN 10 mcg/min (11/15/18 0700)   PRN Meds: sodium chloride, acetaminophen, nitroGLYCERIN, ondansetron (ZOFRAN) IV, sodium chloride flush   Vital Signs    Vitals:   11/15/18 0615 11/15/18 0630 11/15/18 0645 11/15/18 0700  BP: (!) 95/56 95/60 96/65  98/67  Pulse: 62 67 79 65  Resp: 14 12 15 14   Temp:      TempSrc:      SpO2: 95% 96% 95% 93%  Weight:      Height:        Intake/Output Summary (Last 24 hours) at 11/15/2018 0754 Last data filed at 11/15/2018 0700 Gross per 24 hour  Intake 1193.1 ml  Output -  Net 1193.1 ml   Last 3 Weights 11/14/2018 11/14/2018 06/14/2018  Weight (lbs) 139 lb 8.8 oz 143 lb 147 lb 8 oz  Weight (kg) 63.3 kg 64.864 kg 66.906 kg      Telemetry    sinus- Personally Reviewed  ECG    Sinus, precordial TWI, evolution of MI - Personally Reviewed  Physical Exam   GEN: No acute distress.   Neck: No JVD Cardiac: RRR, no murmurs, rubs, or gallops.  Respiratory: Clear to auscultation bilaterally. GI: Soft, nontender, non-distended  MS: No edema; No deformity. Neuro:  Nonfocal  Psych: Normal affect   Labs    High Sensitivity Troponin:   Recent Labs  Lab 11/14/18 1027 11/14/18 1145 11/14/18 1905  TROPONINIHS 76* 395* 3,256*      Cardiac EnzymesNo results  for input(s): TROPONINI in the last 168 hours. No results for input(s): TROPIPOC in the last 168 hours.   Chemistry Recent Labs  Lab 11/14/18 1027 11/14/18 2244  NA 137  --   K 4.1  --   CL 104  --   CO2 23  --   GLUCOSE 110*  --   BUN 19  --   CREATININE 0.57 0.75  CALCIUM 9.8  --   GFRNONAA >60 >60  GFRAA >60 >60  ANIONGAP 10  --      Hematology Recent Labs  Lab 11/14/18 1027 11/14/18 2244  WBC 8.2 12.7*  RBC 4.88 4.40  HGB 14.4 13.4  HCT 45.0 40.5  MCV 92.2 92.0  MCH 29.5 30.5  MCHC 32.0 33.1  RDW 12.9 12.9  PLT 283 276    BNPNo results for input(s): BNP, PROBNP in the last 168 hours.   DDimer  Recent Labs  Lab 11/14/18 1104  DDIMER <0.27     Radiology    Dg Chest 2 View  Result Date: 11/14/2018 CLINICAL DATA:  Left-sided chest pain beginning yesterday which radiates to the left arm and jaw. Shortness of breath. Dizziness. Nausea and vomiting. EXAM: CHEST - 2 VIEW  COMPARISON:  None. FINDINGS: Patient is rotated to the left. Heart size is normal. Pulmonary hyperinflation is noted however both lungs are clear. No evidence of pleural effusion. Thoracic dextroscoliosis noted. IMPRESSION: Pulmonary hyperinflation. No active cardiopulmonary disease. Electronically Signed   By: Marlaine Hind M.D.   On: 11/14/2018 10:20   Ct Angio Chest Pe W/cm &/or Wo Cm  Result Date: 11/14/2018 CLINICAL DATA:  Left chest pain intermittently since yesterday, constant and progressively worse since this morning at 8:30 a.m. The pain radiates into the mandible and left arm. Shortness of breath, dizziness and weakness this morning. Nausea and vomiting yesterday. EXAM: CT ANGIOGRAPHY CHEST WITH CONTRAST TECHNIQUE: Multidetector CT imaging of the chest was performed using the standard protocol during bolus administration of intravenous contrast. Multiplanar CT image reconstructions and MIPs were obtained to evaluate the vascular anatomy. CONTRAST:  112mL OMNIPAQUE IOHEXOL 350 MG/ML SOLN  COMPARISON:  Chest CT dated 10/21/2013. FINDINGS: Cardiovascular: Satisfactory opacification of the pulmonary arteries to the segmental level. No evidence of pulmonary embolism. Normal heart size. No pericardial effusion. Mediastinum/Nodes: No enlarged mediastinal, hilar, or axillary lymph nodes. Thyroid gland, trachea, and esophagus demonstrate no significant findings. Lungs/Pleura: Minimal bibasilar atelectasis. Otherwise, clear lungs. Small left lower lobe calcified granuloma. Small right lower lobe calcified granuloma. No pleural fluid. Upper Abdomen: Unremarkable. Musculoskeletal: Mild-to-moderate scoliosis. Mild midthoracic lordosis. Mild thoracic spine degenerative changes. Review of the MIP images confirms the above findings. IMPRESSION: No pulmonary emboli or acute abnormality. Electronically Signed   By: Claudie Revering M.D.   On: 11/14/2018 13:29    Cardiac Studies   Cardiac cath 11/14/18:  Mid LAD lesion is 99% stenosed.  1st Diag lesion is 60% stenosed.  Post intervention, there is a 5% residual stenosis.  The left ventricular ejection fraction is 45-50% by visual estimate.  LV end diastolic pressure is normal.  The left ventricular systolic function is normal.  A stent was successfully placed.  Diagnostic Dominance: Right  Intervention     Patient Profile     53 y.o. female with history of tobacco abuse, DM admitted 11/14/18 with chest pain and found to have a STEMI secondary to severe stenosis in the proximal to mid LAD which was treated with a drug eluting stent. Normal LV systolic function by LV gram.   Assessment & Plan    1. CAD/Acute anterior STEMI: LAD stent placed 11/14/18. Doing well this am. No chest pain. Will continue ASA/Brilinta, statin and beta blocker. Echo later today. She will not tolerate higher doses of beta blockers due to hypotension. No ARB/ACE INH due to hypotension.   2. DM: Continue sliding scale insulin. Metformin on hold.   3. Tobacco abuse:  Smoking cessation is recommended.   Will write transfer orders for telemetry if ICU bed is needed.   For questions or updates, please contact [place] Please consult [email or web address] for contact info under        Signed, Lauree Chandler, MD  11/15/2018, 7:54 AM

## 2018-11-15 NOTE — Progress Notes (Signed)
Pt's TR band was reinflated due to small hematoma formation and increase. Will continue to reassess.

## 2018-11-16 DIAGNOSIS — Z720 Tobacco use: Secondary | ICD-10-CM

## 2018-11-16 DIAGNOSIS — E118 Type 2 diabetes mellitus with unspecified complications: Secondary | ICD-10-CM

## 2018-11-16 DIAGNOSIS — I5021 Acute systolic (congestive) heart failure: Secondary | ICD-10-CM

## 2018-11-16 DIAGNOSIS — I214 Non-ST elevation (NSTEMI) myocardial infarction: Secondary | ICD-10-CM

## 2018-11-16 LAB — BASIC METABOLIC PANEL
Anion gap: 7 (ref 5–15)
BUN: 16 mg/dL (ref 6–20)
CO2: 24 mmol/L (ref 22–32)
Calcium: 9.9 mg/dL (ref 8.9–10.3)
Chloride: 108 mmol/L (ref 98–111)
Creatinine, Ser: 0.74 mg/dL (ref 0.44–1.00)
GFR calc Af Amer: >60 mL/min (ref >60)
GFR calc non Af Amer: >60 mL/min (ref >60)
Glucose, Bld: 185 mg/dL — ABNORMAL HIGH (ref 70–99)
Potassium: 3.9 mmol/L (ref 3.5–5.1)
Sodium: 139 mmol/L (ref 135–145)

## 2018-11-16 LAB — CBC
HCT: 41.2 % (ref 36.0–46.0)
Hemoglobin: 13.6 g/dL (ref 12.0–15.0)
MCH: 30.4 pg (ref 26.0–34.0)
MCHC: 33 g/dL (ref 30.0–36.0)
MCV: 92 fL (ref 80.0–100.0)
Platelets: 276 10*3/uL (ref 150–400)
RBC: 4.48 MIL/uL (ref 3.87–5.11)
RDW: 13.1 % (ref 11.5–15.5)
WBC: 7.9 10*3/uL (ref 4.0–10.5)
nRBC: 0 % (ref 0.0–0.2)

## 2018-11-16 LAB — GLUCOSE, CAPILLARY
Glucose-Capillary: 160 mg/dL — ABNORMAL HIGH (ref 70–99)
Glucose-Capillary: 160 mg/dL — ABNORMAL HIGH (ref 70–99)

## 2018-11-16 MED ORDER — ATORVASTATIN CALCIUM 80 MG PO TABS: 80.0000 mg | ORAL_TABLET | Freq: Every day | ORAL | 1 refills | Status: DC

## 2018-11-16 MED ORDER — TICAGRELOR 90 MG PO TABS: 90.0000 mg | ORAL_TABLET | Freq: Two times a day (BID) | ORAL | 2 refills | Status: DC

## 2018-11-16 MED ORDER — NITROGLYCERIN 0.4 MG SL SUBL: 0.4000 mg | SUBLINGUAL_TABLET | SUBLINGUAL | 2 refills | Status: DC | PRN

## 2018-11-16 MED ORDER — METOPROLOL TARTRATE 25 MG PO TABS: 12.5000 mg | ORAL_TABLET | Freq: Two times a day (BID) | ORAL | 1 refills | Status: DC

## 2018-11-16 MED FILL — BRILINTA 90 MG TABLET: 90 | 30 days supply | Qty: 60 | Fill #0

## 2018-11-16 MED FILL — ATORVASTATIN CALCIUM 80 MG: 80 | 90 days supply | Qty: 90 | Fill #0

## 2018-11-16 MED FILL — METOPROLOL TARTRATE 25 MG T: 25 | 60 days supply | Qty: 60 | Fill #0

## 2018-11-16 MED FILL — NITROGLYCERIN 0.4 MG TAB SL: 0.4 | 7 days supply | Qty: 25 | Fill #0

## 2018-11-16 NOTE — Progress Notes (Signed)
Writer agrees with Biochemist, clinical.

## 2018-11-16 NOTE — Discharge Summary (Addendum)
Discharge Summary    Patient ID: [name],  MRN: [id number], DOB/AGE: [id number] y.o.  Admit date: 11/14/2018 Discharge date: 11/16/2018  Primary Care Provider: [name] Primary Cardiologist: [name], MD  Discharge Diagnoses    Principal Problem:   Unstable angina [facility]) Active Problems:   Nausea   Non-ST elevation (NSTEMI) myocardial infarction [facility])   Acute systolic heart failure ([place])   Tobacco use   Type 2 diabetes mellitus with complication, without long-term current use of insulin (HCC)   Allergies Allergies  Allergen Reactions   Oxycodone Itching   Sulfa Antibiotics Itching    Diagnostic Studies/Procedures    Cath: 11/14/18   Stuttering anterior septal myocardial infarction over the past 12 hours  99% proximal LAD with TIMI grade II flow treated with PCI using 2 seven 5 x 22 Onyx drug-eluting stent and postdilated to 3.0 mm in diameter with resultant TIMI grade III flow.  0% stenosis.  Normal left main.  First diagonal which arises proximal to the LAD culprit site contains diffuse 60% ostial to proximal stenosis.  Normal circumflex  Normal RCA, dominant.  RECOMMENDATIONS:   Dual antiplatelet therapy with aspirin and Brilinta x12 months.  Aggressive secondary risk factor modification per guideline recommendations.  Depending upon clinical course, would anticipate discharge in 24 to 36 hours.  Needs phase 2 cardiac rehab to help reinforce lifestyle changes necessary to improve outcome.  Diagnostic Dominance: Right  Intervention    TTE: 11/15/18  IMPRESSIONS   1. Severe hypokinesis of the left ventricular, apical anterior wall.  2. The left ventricle has mildly reduced systolic function, with an ejection fraction of 45-50%. The cavity size was normal. Left ventricular diastolic Doppler parameters are consistent with impaired relaxation.  3. The right ventricle has normal systolic function. The cavity was normal.  There is no increase in right ventricular wall thickness.  4. Left atrial size was mildly dilated.  5. The mitral valve is grossly normal. Moderate thickening of the mitral valve leaflet.  6. The tricuspid valve is grossly normal.  7. The aortic valve is tricuspid.  8. The aorta is normal unless otherwise noted.  _____________   History of Present Illness     53 year old woman with a history of current tobacco use and type 2 DM but no prior cardiovascular history who present with chest pain.   For the past week she had noticed relatively mild transient chest pain episodes that came on at random times. On Friday prior to admission she was walking into work and when she arrived began to have more severe chest pain that eventually was relieved with rest but did cause her to miss a meeting. The same thing happened again on the long walk back to her car. The following morning she was awoken by fairly severe substernal chest pain at 4am then again at 8am with radiation to her neck and left arm. The pain did not abate this time so she took a full dose aspirin at home and came to the [facility] ED.   Initial ECG around 10am is limited by poor quality but shows significant inferior ST depression, more subtle V5-V6, as well as ST elevation in V2 and possibly V1, aVR, aVL, however there was marked baseline sway in these leads. Repeat ECG about 1 hour later showed some persistent ST depression inferiorly, though other ST changes had resolved. Troponin was elevated to 76, then 395. Her pain did improve with nitroglycerin and  morphine but never completely resolved. Despite an undetectable d-dimer, CTA was done that was negative for PE. After speaking with cardiology here, she was started on heparin and transferred to [facility] for further management.   She arrived around 6pm with significant ongoing chest pain despite receiving 2 SL nitroglycerin en route. ECG showed resolution of her ST changes. She was started on  nitro infusion titrated up as blood pressure would tolerate but continued to have chest pain with left arm numbness. On evaluation the pain did not seem pleuritic or positional. Case discussed with Dr. [name] and decision was made to proceed with emergent catheterization which showed subtotal occlusion of the proximal LAD with TIMI II flow, successfully treated with Onyx DES and IC nitro with complete resolution of her chest pain. There was otherwise moderate non-obstructive disease in D1. She was taken to the ICU in stable condition.   Hospital Course     HsT peaked at 3256. Placed on DAPT with ASA/Brilinta for at least one year post cath. Started on high dose statin, LDL 79. Was able to tolerate the addition of low dose BB therapy, but unable to further titrate 2/2 to bradycardia and soft blood pressures. Follow up echo showed EF of 45-50% with severe hypokinesis of the left ventricular, apical anterior wall. Transferred from ICU on 4/40/34 without complications. Worked well with cardiac rehab without recurrent chest pain. Prior to admission was on metformin and Jardiance, will continue the same at discharge. Smoking cessation encouraged.   General: Well developed, well nourished, female appearing in no acute distress. Head: Normocephalic, atraumatic.  Neck: Supple without bruits, JVD. Lungs:  Resp regular and unlabored, CTA. Heart: RRR, S1, S2, no S3, S4, or murmur; no rub. Abdomen: Soft, non-tender, non-distended with normoactive bowel sounds. No hepatomegaly. No rebound/guarding. No obvious abdominal masses. Extremities: No clubbing, cyanosis, edema. Distal pedal pulses are 2+ bilaterally. Right radial cath site stable without bruising or hematoma Neuro: Alert and oriented X 3. Moves all extremities spontaneously. Psych: Normal affect.  Casey Mason was seen by Dr. Burt Knack and determined stable for discharge home. Follow up in the office has been arranged. Medications are listed below.    _____________  Discharge Vitals Blood pressure 113/72, pulse 76, temperature 98.5 F (36.9 C), temperature source Oral, resp. rate 14, height 5\' 6"  (1.676 m), weight 64.5 kg, SpO2 96 %.  Filed Weights   11/14/18 2245 11/15/18 1749 11/16/18 0524  Weight: 63.3 kg 64.9 kg 64.5 kg    Labs & Radiologic Studies    CBC Recent Labs    11/15/18 0808 11/16/18 0035  WBC 8.6 7.9  HGB 13.0 13.6  HCT 39.7 41.2  MCV 92.5 92.0  PLT 266 742   Basic Metabolic Panel Recent Labs    11/15/18 0808 11/16/18 0035  NA 139 139  K 4.2 3.9  CL 110 108  CO2 23 24  GLUCOSE 188* 185*  BUN 16 16  CREATININE 0.76 0.74  CALCIUM 9.1 9.9  MG 2.1  --    Liver Function Tests No results for input(s): AST, ALT, ALKPHOS, BILITOT, PROT, ALBUMIN in the last 72 hours. No results for input(s): LIPASE, AMYLASE in the last 72 hours. Cardiac Enzymes No results for input(s): CKTOTAL, CKMB, CKMBINDEX, TROPONINI in the last 72 hours. BNP Invalid input(s): POCBNP D-Dimer Recent Labs    11/14/18 1104  DDIMER <0.27   Hemoglobin A1C Recent Labs    11/15/18 0808  HGBA1C 6.0*   Fasting Lipid Panel Recent Labs  11/15/18 0808  CHOL 138  HDL 41  LDLCALC 79  TRIG 89  CHOLHDL 3.4   Thyroid Function Tests No results for input(s): TSH, T4TOTAL, T3FREE, THYROIDAB in the last 72 hours.  Invalid input(s): FREET3 _____________  Dg Chest 2 View  Result Date: 11/14/2018 CLINICAL DATA:  Left-sided chest pain beginning yesterday which radiates to the left arm and jaw. Shortness of breath. Dizziness. Nausea and vomiting. EXAM: CHEST - 2 VIEW COMPARISON:  None. FINDINGS: Patient is rotated to the left. Heart size is normal. Pulmonary hyperinflation is noted however both lungs are clear. No evidence of pleural effusion. Thoracic dextroscoliosis noted. IMPRESSION: Pulmonary hyperinflation. No active cardiopulmonary disease. Electronically Signed   By: Marlaine Hind M.D.   On: 11/14/2018 10:20   Ct Angio Chest  Pe W/cm &/or Wo Cm  Result Date: 11/14/2018 CLINICAL DATA:  Left chest pain intermittently since yesterday, constant and progressively worse since this morning at 8:30 a.m. The pain radiates into the mandible and left arm. Shortness of breath, dizziness and weakness this morning. Nausea and vomiting yesterday. EXAM: CT ANGIOGRAPHY CHEST WITH CONTRAST TECHNIQUE: Multidetector CT imaging of the chest was performed using the standard protocol during bolus administration of intravenous contrast. Multiplanar CT image reconstructions and MIPs were obtained to evaluate the vascular anatomy. CONTRAST:  172mL OMNIPAQUE IOHEXOL 350 MG/ML SOLN COMPARISON:  Chest CT dated 10/21/2013. FINDINGS: Cardiovascular: Satisfactory opacification of the pulmonary arteries to the segmental level. No evidence of pulmonary embolism. Normal heart size. No pericardial effusion. Mediastinum/Nodes: No enlarged mediastinal, hilar, or axillary lymph nodes. Thyroid gland, trachea, and esophagus demonstrate no significant findings. Lungs/Pleura: Minimal bibasilar atelectasis. Otherwise, clear lungs. Small left lower lobe calcified granuloma. Small right lower lobe calcified granuloma. No pleural fluid. Upper Abdomen: Unremarkable. Musculoskeletal: Mild-to-moderate scoliosis. Mild midthoracic lordosis. Mild thoracic spine degenerative changes. Review of the MIP images confirms the above findings. IMPRESSION: No pulmonary emboli or acute abnormality. Electronically Signed   By: Claudie Revering M.D.   On: 11/14/2018 13:29   Disposition   Pt is being discharged home today in good condition.  Follow-up Plans & Appointments    Follow-up Information    Tommie Raymond, NP Follow up on 11/25/2018.   Specialty: Cardiology Why: at 2pm for your follow up appt.  Contact information: [place] [id number] [phone]          Discharge Instructions    Amb Referral to Cardiac Rehabilitation   Complete by: As  directed    Diagnosis:  STEMI Coronary Stents     After initial evaluation and assessments completed: Virtual Based Care may be provided alone or in conjunction with Phase 2 Cardiac Rehab based on patient barriers.: Yes   Call MD for:  redness, tenderness, or signs of infection (pain, swelling, redness, odor or green/yellow discharge around incision site)   Complete by: As directed    Diet - low sodium heart healthy   Complete by: As directed    Discharge instructions   Complete by: As directed    Radial Site Care Refer to this sheet in the next few weeks. These instructions provide you with information on caring for yourself after your procedure. Your caregiver may also give you more specific instructions. Your treatment has been planned according to current medical practices, but problems sometimes occur. Call your caregiver if you have any problems or questions after your procedure. HOME CARE INSTRUCTIONS You may shower the day after the procedure.Remove the bandage (dressing) and  gently wash the site with plain soap and water.Gently pat the site dry.  Do not apply powder or lotion to the site.  Do not submerge the affected site in water for 3 to 5 days.  Inspect the site at least twice daily.  Do not flex or bend the affected arm for 24 hours.  No lifting over 5 pounds (2.3 kg) for 5 days after your procedure.  Do not drive home if you are discharged the same day of the procedure. Have someone else drive you.  You may drive 24 hours after the procedure unless otherwise instructed by your caregiver.  What to expect: Any bruising will usually fade within 1 to 2 weeks.  Blood that collects in the tissue (hematoma) may be painful to the touch. It should usually decrease in size and tenderness within 1 to 2 weeks.  SEEK IMMEDIATE MEDICAL CARE IF: You have unusual pain at the radial site.  You have redness, warmth, swelling, or pain at the radial site.  You have drainage (other than a  small amount of blood on the dressing).  You have chills.  You have a fever or persistent symptoms for more than 72 hours.  You have a fever and your symptoms suddenly get worse.  Your arm becomes pale, cool, tingly, or numb.  You have heavy bleeding from the site. Hold pressure on the site.   PLEASE DO NOT MISS ANY DOSES OF YOUR BRILINTA!!!!! Also keep a log of you blood pressures and bring back to your follow up appt. Please call the office with any questions.   Patients taking blood thinners should generally stay away from medicines like ibuprofen, Advil, Motrin, naproxen, and Aleve due to risk of stomach bleeding. You may take Tylenol as directed or talk to your primary doctor about alternatives.  Some studies suggest Prilosec/Omeprazole interacts with Plavix. We changed your Prilosec/Omeprazole to the equivalent dose of Protonix for less chance of interaction.   Increase activity slowly   Complete by: As directed        Discharge Medications     Medication List    STOP taking these medications   meloxicam 15 MG tablet Commonly known as: MOBIC   naproxen sodium 220 MG tablet Commonly known as: ALEVE     TAKE these medications   Apple Cider Vinegar 500 MG Tabs Take 500 mg by mouth 2 (two) times daily.   aspirin 81 MG tablet Take 81 mg by mouth daily.   atorvastatin 80 MG tablet Commonly known as: LIPITOR Take 1 tablet (80 mg total) by mouth daily at 6 PM.   Cinnamon 500 MG capsule Take 1,000 mg by mouth daily.   Coenzyme Q10 100 MG capsule Take 100 mg by mouth daily.   COLLAGEN PO Take 1,000 mg by mouth 2 (two) times daily.   ibandronate 150 MG tablet Commonly known as: BONIVA Take 150 mg by mouth every 30 (thirty) days.   Jardiance 25 MG Tabs tablet Generic drug: empagliflozin Take 25 mg by mouth daily.   magnesium oxide 400 MG tablet Commonly known as: MAG-OX Take 400 mg by mouth daily.   metFORMIN 500 MG tablet Commonly known as: GLUCOPHAGE Take  1,000 mg by mouth 2 (two) times daily. What changed: Another medication with the same name was removed. Continue taking this medication, and follow the directions you see here.   metoprolol tartrate 25 MG tablet Commonly known as: LOPRESSOR Take 0.5 tablets (12.5 mg total) by mouth 2 (two) times daily.  MiraLax 17 GM/SCOOP powder Generic drug: polyethylene glycol powder Take 1 Container by mouth 2 (two) times daily as needed for mild constipation or moderate constipation.   multivitamin tablet Take 1 tablet by mouth daily.   nitroGLYCERIN 0.4 MG SL tablet Commonly known as: NITROSTAT Place 1 tablet (0.4 mg total) under the tongue every 5 (five) minutes x 3 doses as needed for chest pain.   pantoprazole 40 MG tablet Commonly known as: PROTONIX TAKE 1 TABLET BY MOUTH TWICE A DAY What changed:   how much to take  when to take this   ticagrelor 90 MG Tabs tablet Commonly known as: BRILINTA Take 1 tablet (90 mg total) by mouth 2 (two) times daily.   vitamin B-12 100 MCG tablet Commonly known as: CYANOCOBALAMIN Take 100 mcg by mouth daily.   Vitamin D3 50 MCG (2000 UT) Tabs Take 2 tablets by mouth daily.        Acute coronary syndrome (MI, NSTEMI, STEMI, etc) this admission?: Yes.     AHA/ACC Clinical Performance & Quality Measures: 1. Aspirin prescribed? - Yes 2. ADP Receptor Inhibitor (Plavix/Clopidogrel, Brilinta/Ticagrelor or Effient/Prasugrel) prescribed (includes medically managed patients)? - Yes 3. Beta Blocker prescribed? - Yes 4. High Intensity Statin (Lipitor 40-80mg  or Crestor 20-40mg ) prescribed? - Yes 5. EF assessed during THIS hospitalization? - Yes 6. For EF <40%, was ACEI/ARB prescribed? - Not Applicable (EF >/= 06%) 7. For EF <40%, Aldosterone Antagonist (Spironolactone or Eplerenone) prescribed? - Not Applicable (EF >/= 23%) 8. Cardiac Rehab Phase II ordered (Included Medically managed Patients)? - Yes     Outstanding Labs/Studies   FLP/LFTs in 6  weeks.   Duration of Discharge Encounter   Greater than 30 minutes including physician time.  Signed, Reino Bellis NP-C 11/16/2018, 11:00 AM   Patient seen, examined. Available data reviewed. Agree with findings, assessment, and plan as outlined by Reino Bellis, NP-C. On exam, she is alert, oriented, in NAD. Lungs CTA< heart RRR no murmur, abdomen soft, NT, ext: no edema, right radial site clear. Focal right forearm hematoma (dorsal aspect of forearm).   Plan: meds reviewed and appropriate. Home today, follow-up Dr [name] or his APP. Start ACE/ARB as outpatient if BP will tolerate (too low in hospital to initiate). Continue ASA, ticagrelor, high-intensity statin, low-dose metoprolol.   Sherren Mocha, M.D. 11/16/2018 11:11 AM

## 2018-11-16 NOTE — Care Management (Signed)
Per Maudie Mercury L. W/[employer] [facility]. Co-pay amount for Brilinta 90 mg.bid $75.00 for 30 day supply. No PA required. Pharmacies are: [facility].

## 2018-11-16 NOTE — Progress Notes (Signed)
CARDIAC REHAB PHASE I   PRE:  Rate/Rhythm: 77 SR  BP:  Supine: 113/71  Sitting:   Standing:    SaO2: 96%RA  MODE:  Ambulation: 600 ft   POST:  Rate/Rhythm: 75 SR  BP:  Supine:   Sitting: 114/69  Standing:    SaO2: 99%RA 0930-1025 Pt walked 600 ft on RA with steady gait and tolerated well. Brief review of main points from ed yesterday. Again stressed importance of brilinta with stent. Reviewed NTG use and ex ed. Have referred to [place] CRP 2.    Graylon Good, RN BSN  11/16/2018 10:20 AM

## 2018-11-16 NOTE — Progress Notes (Signed)
Discharge instructions given to patient has paperwork etc. Husband at bedside she reported would get dressed and call out when she was ready for discharge. She received her meds from pharmacy as well.

## 2018-11-16 NOTE — TOC Transition Note (Signed)
Transition of Care [facility]) - CM/SW Discharge Note   Patient Details  Name: Casey Mason MRN: [id number] Date of Birth: June 20, 1965  Transition of Care [facility]) CM/SW Contact:  Zenon Mayo, RN Phone Number: 11/16/2018, 1:02 PM   Clinical Narrative:    From home with spouse, for dc today, s/p stent intervention, will be on brilinta, NCM gave patient the 5.00 co pay card, toc pharmacy filled the 30 day free brilinta for patient, NCM informed her of benefit check for copay of 75.00 for 30 day supply if her deductible is not met.   Final next level of care: Home/Self Care Barriers to Discharge: No Barriers Identified   Patient Goals and CMS Choice Patient states their goals for this hospitalization and ongoing recovery are:: get better   Choice offered to / list presented to : NA  Discharge Placement                       Discharge Plan and Services                DME Arranged: (NA)         HH Arranged: NA          Social Determinants of Health (SDOH) Interventions     Readmission Risk Interventions No flowsheet data found.

## 2018-11-22 ENCOUNTER — Telehealth: Payer: Self-pay | Admitting: Cardiology

## 2018-11-22 NOTE — Telephone Encounter (Signed)
° °  Patient would like to know if her Husband would be able to be in the room during her upcoming visit. The patient is still feeling overwhelmed and would like to have her husband there for support. She understands if he is not allowed, but she just wanted to ask.

## 2018-11-24 NOTE — Progress Notes (Signed)
Cardiology Office Note   Date:  11/25/2018   ID:  Pahola, [name] 06/15/1965, MRN [id number]  PCP:  [name], MD  Cardiologist:  Dr. [name], MD   Chief Complaint  Patient presents with  . Hospitalization Follow-up     History of Present Illness: Casey Mason is a 53 y.o. female who presents for hospital follow-up follow-up for unstable angina.  [name] is a 53yo F with a history of current tobacco use and type 2 DM but no prior cardiovascular history who presented to [facility] with chest pain. Prior to presentation, she had noticed relatively mild transient chest pain episodes that would come on randomly.  On the Friday prior to admission she was walking into work when she arrived she began having more severe chest pain that eventually was relieved with rest but did not cause her to miss work.  She had an additional episode early the next morning.  Given this, she presented to [facility] ED for further evaluation and was then transferred to [facility].   Initial ECG showed significant inferior ST depression, more subtle V5-V6, as well as ST elevation in V2 and possibly V1, aVR, aVL Repeat ECG about 1 hour later showed some persistent ST depression inferiorly, though other ST changes had resolved. Troponin levels were elevated to 76, then 395. Her pain did improve with nitroglycerin and morphine but never completely resolved.   On arrival, case was discussed with Dr. [name] and the decision was made to proceed with emergent catheterization which showed subtotal occlusion of the proximal LAD with TIMI II flow, successfully treated with Onyx DES and IC nitro with complete resolution of her chest pain. There was otherwise moderate non-obstructive disease in D1.  Follow-up echocardiogram showed LVEF of 45 to 50%.  Today she presents and is doing well from a cardiac perspective.  She is quite tearful because this is all been overwhelming for her.  She comes with a BP log, multiple questions  which are all appropriate.  She states that her hemoglobin A1c per PCP the end of last year was 14.0 and she is down to 6.0.  She is very regimented with her diet and exercise regimen.  She has been Advertising account executive.  We discussed medication side effects and follow-up expectations.  She has not smoked cigarettes since her discharge.  She is utilizing lozenges and gum.  Her cath site is stable without signs of hematoma.  She has follow-up with her PCP next week.  She has no signs of bleeding in stool or urine from Brilinta or aspirin.  She denies recurrent chest discomfort.  She has no shortness of breath, LE swelling, orthopnea, PND, palpitations or syncope.  Overall she is doing well.  Past Medical History:  Diagnosis Date  . Diabetes mellitus without complication ([place])   . Diverticulosis of colon   . Family history of adverse reaction to anesthesia    mother-- ponv  . History of colon polyps    2016- hyperplastic  . History of diverticulitis of colon   . Nephrolithiasis    right  . PONV (postoperative nausea and vomiting)   . Prolapse of female bladder, acquired   . Right ureteral stone     Past Surgical History:  Procedure Laterality Date  . BLADDER SUSPENSION    . CESAREAN SECTION  1988  . COLONOSCOPY  11-10-2014  in [place]  . CORONARY STENT INTERVENTION N/A 11/14/2018   Procedure: CORONARY STENT INTERVENTION;  Surgeon: [name],  Lynnell Dike, MD;  Location: [place] [facility];  Service: Cardiovascular;  Laterality: N/A;  . CORONARY/GRAFT ACUTE MI REVASCULARIZATION N/A 11/14/2018   Procedure: Coronary/Graft Acute MI Revascularization;  Surgeon: Belva Crome, MD;  Location: [place] [facility];  Service: Cardiovascular;  Laterality: N/A;  . CYSTOSCOPY WITH RETROGRADE PYELOGRAM, URETEROSCOPY AND STENT PLACEMENT Right 02/07/2016   Procedure: CYSTOSCOPY WITH RIGHT PYLEOGRAM  RIGHT URETEROSCOPY, , DIALATION OF RIGHT URETER STRICUTURE FLEXIBLE RIGHT URETEROSCOPY, RIGHT PYELOSCOPY,  AND RIGHT DOUBLE STENT  PLACEMENT WITH TETHER;  Surgeon: Carolan Clines, MD;  Location: [facility];  Service: Urology;  Laterality: Right;  . EXTRACORPOREAL SHOCK WAVE LITHOTRIPSY Right 09/20/2015  . LEFT HEART CATH AND CORONARY ANGIOGRAPHY N/A 11/14/2018   Procedure: LEFT HEART CATH AND CORONARY ANGIOGRAPHY;  Surgeon: Belva Crome, MD;  Location: [place] [facility];  Service: Cardiovascular;  Laterality: N/A;  . ROTATOR CUFF REPAIR Right 2013 or 2014??  . TONSILLECTOMY  2003  . TUBAL LIGATION  1992  . VAGINAL HYSTERECTOMY  2005     Current Outpatient Medications  Medication Sig Dispense Refill  . Apple Cider Vinegar 500 MG TABS Take 500 mg by mouth 2 (two) times daily.     Marland Kitchen aspirin 81 MG tablet Take 81 mg by mouth daily.    Marland Kitchen atorvastatin (LIPITOR) 80 MG tablet Take 1 tablet (80 mg total) by mouth daily at 6 PM. 90 tablet 1  . Cholecalciferol (VITAMIN D3) 2000 units TABS Take 2 tablets by mouth daily.     . Cinnamon 500 MG capsule Take 1,000 mg by mouth daily.     . Coenzyme Q10 100 MG capsule Take 100 mg by mouth daily.    . COLLAGEN PO Take 1,000 mg by mouth 2 (two) times daily.    . empagliflozin (JARDIANCE) 25 MG TABS tablet Take 25 mg by mouth daily.    Marland Kitchen ibandronate (BONIVA) 150 MG tablet Take 150 mg by mouth every 30 (thirty) days.    . magnesium oxide (MAG-OX) 400 MG tablet Take 400 mg by mouth daily.    . metFORMIN (GLUCOPHAGE) 500 MG tablet Take 1,000 mg by mouth 2 (two) times daily.    . metoprolol tartrate (LOPRESSOR) 25 MG tablet Take 0.5 tablets (12.5 mg total) by mouth 2 (two) times daily. 60 tablet 1  . Multiple Vitamin (MULTIVITAMIN) tablet Take 1 tablet by mouth daily.    . nitroGLYCERIN (NITROSTAT) 0.4 MG SL tablet Place 1 tablet (0.4 mg total) under the tongue every 5 (five) minutes x 3 doses as needed for chest pain. 25 tablet 2  . pantoprazole (PROTONIX) 40 MG tablet TAKE 1 TABLET BY MOUTH TWICE A DAY (Patient taking differently: Take 20 mg by mouth daily. ) 180 tablet  0  . polyethylene glycol powder (MIRALAX) powder Take 1 Container by mouth 2 (two) times daily as needed for mild constipation or moderate constipation.     . ticagrelor (BRILINTA) 90 MG TABS tablet Take 1 tablet (90 mg total) by mouth 2 (two) times daily. 180 tablet 2  . vitamin B-12 (CYANOCOBALAMIN) 100 MCG tablet Take 100 mcg by mouth daily.     No current facility-administered medications for this visit.     Allergies:   Oxycodone and Sulfa antibiotics    Social History:  The patient  reports that she has been smoking cigarettes. She has a 7.50 pack-year smoking history. She has never used smokeless tobacco. She reports current alcohol use. She reports that she does not  use drugs.   Family History:  The patient's family history includes Anemia in her mother.   ROS:  Please see the history of present illness. Otherwise, review of systems are positive for none.   All other systems are reviewed and negative.    PHYSICAL EXAM: VS:  BP 100/62   Pulse 98   Ht 5\' 6"  (1.676 m)   Wt 141 lb (64 kg)   SpO2 98%   BMI 22.76 kg/m  , BMI Body mass index is 22.76 kg/m.   General: Well developed, well nourished, NAD Neck: Negative for carotid bruits. No JVD Lungs:Clear to ausculation bilaterally. No wheezes, rales, or rhonchi. Breathing is unlabored. Cardiovascular: RRR with S1 S2. No murmurs Etremities: No edema. No clubbing or cyanosis. DP/PT pulses 2+ bilaterally Neuro: Alert and oriented. No focal deficits. No facial asymmetry. MAE spontaneously. Psych: Responds to questions appropriately with normal affect.    EKG:  EKG is not ordered today.   Recent Labs: 11/15/2018: Magnesium 2.1 11/16/2018: BUN 16; Creatinine, Ser 0.74; Hemoglobin 13.6; Platelets 276; Potassium 3.9; Sodium 139    Lipid Panel    Component Value Date/Time   CHOL 138 11/15/2018 0808   TRIG 89 11/15/2018 0808   HDL 41 11/15/2018 0808   CHOLHDL 3.4 11/15/2018 0808   VLDL 18 11/15/2018 0808   LDLCALC 79  11/15/2018 0808      Wt Readings from Last 3 Encounters:  11/25/18 141 lb (64 kg)  11/16/18 142 lb 3.2 oz (64.5 kg)  06/14/18 147 lb 8 oz (66.9 kg)      Other studies Reviewed: Additional studies/ records that were reviewed today include:   Cath: 11/14/18   Stuttering anterior septal myocardial infarction over the past 12 hours  99% proximal LAD with TIMI grade II flow treated with PCI using 2 seven 5 x 22 Onyx drug-eluting stent and postdilated to 3.0 mm in diameter with resultant TIMI grade III flow. 0% stenosis.  Normal left main.  First diagonal which arises proximal to the LAD culprit site contains diffuse 60% ostial to proximal stenosis.  Normal circumflex  Normal RCA, dominant.  RECOMMENDATIONS:   Dual antiplatelet therapy with aspirin and Brilinta x12 months.  Aggressive secondary risk factor modification per guideline recommendations.  Depending upon clinical course, would anticipate discharge in 24 to 36 hours.  Needs phase 2 cardiac rehab to help reinforce lifestyle changes necessary to improve outcome.  Diagnostic Dominance: Right  Intervention    TTE: 11/15/18  IMPRESSIONS  1. Severe hypokinesis of the left ventricular, apical anterior wall. 2. The left ventricle has mildly reduced systolic function, with an ejection fraction of 45-50%. The cavity size was normal. Left ventricular diastolic Doppler parameters are consistent with impaired relaxation. 3. The right ventricle has normal systolic function. The cavity was normal. There is no increase in right ventricular wall thickness. 4. Left atrial size was mildly dilated. 5. The mitral valve is grossly normal. Moderate thickening of the mitral valve leaflet. 6. The tricuspid valve is grossly normal. 7. The aortic valve is tricuspid. 8. The aorta is normal unless otherwise noted.  ASSESSMENT AND PLAN:  1.  CAD, acute anterior STEMI: -pLAD stent placed 11/14/2018 -Continue ASA  and Brilinta -Continue statin, beta-blocker -Echocardiogram with LVEF of 45 to 50% and impaired relaxation -Depending on BP -ARB not initiated today secondary to hypotension, BP 100/62 -May need further reduction in beta-blocker to initiate ARB in the future. -We will need repeat echocardiogram 01/2019 along with lipid and LFTs  2.  DM2: -Hemoglobin A1c, 6.0 -Continue current regimen, managed by PCP -Diet and exercise regimen discussed at length  3.  Tobacco abuse: -Cessation strongly encouraged -Congratulated as she has not smoked since hospital discharge   Current medicines are reviewed at length with the patient today.  The patient does not have concerns regarding medicines.  The following changes have been made:  no change  Labs/ tests ordered today include: None  No orders of the defined types were placed in this encounter.    Disposition:   FU with myself or Dr. [name] in 1 month  Signed, Kathyrn Drown, NP  11/25/2018 1:57 PM    [facility] [address], [place], [place]  [address] Phone: [phone]; Fax: [phone]

## 2018-11-25 ENCOUNTER — Encounter: Payer: Self-pay | Admitting: Cardiology

## 2018-11-25 ENCOUNTER — Ambulatory Visit: Payer: 59 | Admitting: Cardiology

## 2018-11-25 ENCOUNTER — Other Ambulatory Visit: Payer: Self-pay

## 2018-11-25 VITALS — BP 100/62 | HR 98 | Ht 66.0 in | Wt 141.0 lb

## 2018-11-25 DIAGNOSIS — I214 Non-ST elevation (NSTEMI) myocardial infarction: Secondary | ICD-10-CM | POA: Diagnosis not present

## 2018-11-25 DIAGNOSIS — I10 Essential (primary) hypertension: Secondary | ICD-10-CM | POA: Diagnosis not present

## 2018-11-25 DIAGNOSIS — I5021 Acute systolic (congestive) heart failure: Secondary | ICD-10-CM | POA: Diagnosis not present

## 2018-11-25 NOTE — Patient Instructions (Signed)
Medication Instructions:  Your physician recommends that you continue on your current medications as directed. Please refer to the Current Medication list given to you today.  If you need a refill on your cardiac medications before your next appointment, please call your pharmacy.   Lab work: NONE If you have labs (blood work) drawn today and your tests are completely normal, you will receive your results only by: Marland Kitchen MyChart Message (if you have MyChart) OR . A paper copy in the mail If you have any lab test that is abnormal or we need to change your treatment, we will call you to review the results.  Testing/Procedures: NONE  Follow-Up: At [facility] you and your health needs are our priority.  As part of our continuing mission to provide you with exceptional heart care, we have created designated Provider Care Teams.  These Care Teams include your primary Cardiologist (physician) and Advanced Practice Providers (APPs -  Physician Assistants and Nurse Practitioners) who all work together to provide you with the care you need, when you need it. You will need a follow up appointment in 1 months.  Please call our office 2 months in advance to schedule this appointment.  You may see [name], MD or Kathyrn Drown, NP  Any Other Special Instructions Will Be Listed Below (If Applicable).

## 2018-12-08 ENCOUNTER — Telehealth: Payer: Self-pay | Admitting: Cardiology

## 2018-12-08 NOTE — Telephone Encounter (Signed)
° °  Patient calling to report "throbbing" in chest/shoulder area   Pt c/o of Chest Pain: STAT if CP now or developed within 24 hours  1. Are you having CP right now? Throbbing sensation, patient states its not pain  2. Are you experiencing any other symptoms (ex. SOB, nausea, vomiting, sweating)? SOB, patient states she works in a [employer] 3. How long have you been experiencing CP? 2 DAYS  4. Is your CP continuous or coming and going? CONTINUOUS   5. Have you taken Nitroglycerin? NO ?

## 2018-12-08 NOTE — Telephone Encounter (Signed)
Agree with plan to try SLN TG for symptoms.  Could we also find out if this is similar to a prior angina?  Associated symptoms?  May need follow-up appointment sooner than 10 7 with Dr. [name].  As far as the cardiac rehabilitation.  From what I can remember, she was planning on [facility] cardiac rehab?  Due to COVID 19 they have been backed up for several months.  If she initially denied interest in cardiac rehab they may not contact her.  If she was interested at discharge, they should be in touch.  I am unsure of the contact number.  Legrand Como do you know this?

## 2018-12-08 NOTE — Telephone Encounter (Signed)
I spoke to the patient who would like to have a Virtual Visit by Video with Sharee Pimple on 9/14.  She took one nitro as instructed and the "throbbing" is reduced, but still there.  She is at work also Advertising account planner.    TELEPHONE CALL NOTE  This patient has been deemed a candidate for follow-up tele-health visit to limit community exposure during the Covid-19 pandemic. I spoke with the patient via phone to discuss instructions.  A Virtual Office Visit appointment type has been scheduled for 9/14 with Kathyrn Drown, with "VIDEO" or "TELEPHONE" in the appointment notes - patient prefers Video type.   Frederik Schmidt, RN 12/08/2018 1:11 PM  The patient has consented to a Virtual Visit by Video on 9/14 with Kathyrn Drown.

## 2018-12-08 NOTE — Telephone Encounter (Signed)
I spoke to the patient who recently had a cardiac intervention and f/u visit with Kathyrn Drown.    Yesterday, the patient began to experience a "throbbing" in her heart area and left shoulder.  She said it is like a "toothache" not a pain.  Her BP 112/64 and HR has been stable.  She said that it is a continuous "nag".   I suggested that she takes a Nitroglycerin tablet to see if she gets relief, according to protocol.     She also had a question about Cardiac Rehab, which Sharee Pimple was going to look into, previously cancelled.  Please advise, thank you.

## 2018-12-08 NOTE — Telephone Encounter (Signed)
° °  Patient would also like to discuss referral for cardiac rehab

## 2018-12-11 NOTE — Progress Notes (Signed)
Virtual Visit via Video Note   This visit type was conducted due to national recommendations for restrictions regarding the COVID-19 Pandemic (e.g. social distancing) in an effort to limit this patient's exposure and mitigate transmission in our community.  Due to her co-morbid illnesses, this patient is at least at moderate risk for complications without adequate follow up.  This format is felt to be most appropriate for this patient at this time.  All issues noted in this document were discussed and addressed.  A limited physical exam was performed with this format.  Please refer to the patient's chart for her consent to telehealth for [facility].   Date:  12/13/2018   ID:  Casey Mason, DOB Oct 22, 1965, MRN [id number]  Patient Location: Home Provider Location: Home  PCP:  [name], MD  Cardiologist:  [name], MD  Electrophysiologist:  None   Evaluation Performed:  Follow-Up Visit  Chief Complaint:  Heart "throbbing"   History of Present Illness:    Casey Mason is a 53 y.o. female withhistory of current tobacco use and type 2 DM but no prior cardiovascular historywho [facility]. Prior to presentation, she had noticed relatively mild transient chest pain episodes that would come on randomly.  On the Friday prior to admission she was walking into work when she arrived she began having more severe chest pain that eventually was relieved with rest but did not cause her to miss work.  She had an additional episode early the next morning. Given this, she presented to [facility] ED for further evaluation and was then transferred to [facility].   Initial ECG showed significant inferior ST depression, more subtle V5-V6, as well as ST elevation in V2 and possibly V1, aVR, aVL Repeat ECG about 1 hour later showed some persistent ST depression inferiorly, though other ST changes had resolved. Troponin levels were elevated to 76, then 395. Her pain did improve  with nitroglycerin and morphine but never completely resolved.   On arrival, case was discussed with Dr. [name] and the decision was made to proceed with emergent catheterization which showed subtotal occlusion of the proximal LAD with TIMI II flow, successfully treated with Onyx DES and IC nitro with complete resolution of her chest pain. There was otherwise moderate non-obstructive disease in D1. Follow-up echocardiogram showed LVEF of 45 to 50%.  She was last seen by myself on 11/25/2018 for hospital follow up and was noted to be doing well from a cardiac perspective. She was quite overwhelmed with her recent dx and had lots of questions about her plan of care. She had stopped smoking and was doing well with her diet from previous dx of DM2 the end of last year, getting her HbA1c from 14.0 to 6.0.   She called our office on 12/08/2018 with new c/o of throbbing in her chest with radiation to her left shoulder. Described like a "toothache" not as a pain. BP was stable at 112/64 as well as her heart rate. It was suggested that she take a SL NTG to assess for resolution.   Today she is being seen virtually for follow-up of CAD.  She reports that that she had two episodes "chest throbbing" while at work last week.  She states it is not pain and it not similar to her prior MI anginal pain however concerning to her.  She states it is more like a mild throbbing sensation.  From the time of discharge till the  time she went back to work she had no similar episodes.  She called our office as described above and did take 1 SL NTG with some relief.  She does not have the sensation while at home, with exertion such as walking, going up stairs or doing moderate housework.  She has no associated symptoms such as shortness of breath, dizziness, nausea or vomiting.  Likely this is in the setting of increased stress from adding work back to her regimen.  Her work has been accommodating and letting her work from home some  days during the week in which she does not feel the sensation.  She is questioning if the mask could be causing her symptoms.  She also has questions regarding [facility] rehabilitation. Initially she was she was noted to not want to participate given her work schedule.  She is now interested in possibly pursuing cardiac rehabilitation at the [facility] given that she works in [place].   The patient does not have symptoms concerning for COVID-19 infection (fever, chills, cough, or new shortness of breath).   Past Medical History:  Diagnosis Date   Diabetes mellitus without complication ([place])    Diverticulosis of colon    Family history of adverse reaction to anesthesia    mother-- ponv   History of colon polyps    2016- hyperplastic   History of diverticulitis of colon    Nephrolithiasis    right   PONV (postoperative nausea and vomiting)    Prolapse of female bladder, acquired    Right ureteral stone    Past Surgical History:  Procedure Laterality Date   BLADDER SUSPENSION     CESAREAN SECTION  1988   COLONOSCOPY  11-10-2014  in [place] 11/14/2018   Procedure: CORONARY STENT INTERVENTION;  Surgeon: Belva Crome, MD;  Location: [place] [facility];  Service: Cardiovascular;  Laterality: N/A;   CORONARY/GRAFT ACUTE MI REVASCULARIZATION N/A 11/14/2018   Procedure: Coronary/Graft Acute MI Revascularization;  Surgeon: Belva Crome, MD;  Location: [place] [facility];  Service: Cardiovascular;  Laterality: N/A;   CYSTOSCOPY WITH RETROGRADE PYELOGRAM, URETEROSCOPY AND STENT PLACEMENT Right 02/07/2016   Procedure: CYSTOSCOPY WITH RIGHT PYLEOGRAM  RIGHT URETEROSCOPY, , DIALATION OF RIGHT URETER STRICUTURE FLEXIBLE RIGHT URETEROSCOPY, RIGHT PYELOSCOPY,  AND RIGHT DOUBLE STENT PLACEMENT WITH TETHER;  Surgeon: Carolan Clines, MD;  Location: [facility];  Service: Urology;  Laterality: Right;   EXTRACORPOREAL SHOCK WAVE  LITHOTRIPSY Right 09/20/2015   LEFT HEART CATH AND CORONARY ANGIOGRAPHY N/A 11/14/2018   Procedure: LEFT HEART CATH AND CORONARY ANGIOGRAPHY;  Surgeon: Belva Crome, MD;  Location: [place] [facility];  Service: Cardiovascular;  Laterality: N/A;   ROTATOR CUFF REPAIR Right 2013 or 2014??   TONSILLECTOMY  2003   TUBAL LIGATION  1992   VAGINAL HYSTERECTOMY  2005     Current Meds  Medication Sig   Apple Cider Vinegar 500 MG TABS Take 500 mg by mouth 2 (two) times daily.    aspirin 81 MG tablet Take 81 mg by mouth daily.   atorvastatin (LIPITOR) 80 MG tablet Take 1 tablet (80 mg total) by mouth daily at 6 PM.   Cholecalciferol (VITAMIN D3) 2000 units TABS Take 2 tablets by mouth daily.    Cinnamon 500 MG capsule Take 1,000 mg by mouth daily.    Coenzyme Q10 100 MG capsule Take 100 mg by mouth daily.   COLLAGEN PO Take 1,000 mg by mouth  2 (two) times daily.   empagliflozin (JARDIANCE) 25 MG TABS tablet Take 25 mg by mouth daily.   ibandronate (BONIVA) 150 MG tablet Take 150 mg by mouth every 30 (thirty) days.   magnesium oxide (MAG-OX) 400 MG tablet Take 400 mg by mouth daily.   metFORMIN (GLUCOPHAGE) 500 MG tablet Take 1,000 mg by mouth 2 (two) times daily.   metoprolol tartrate (LOPRESSOR) 25 MG tablet Take 0.5 tablets (12.5 mg total) by mouth 2 (two) times daily.   Multiple Vitamin (MULTIVITAMIN) tablet Take 1 tablet by mouth daily.   nitroGLYCERIN (NITROSTAT) 0.4 MG SL tablet Place 1 tablet (0.4 mg total) under the tongue every 5 (five) minutes x 3 doses as needed for chest pain.   pantoprazole (PROTONIX) 40 MG tablet TAKE 1 TABLET BY MOUTH TWICE A DAY (Patient taking differently: Take 20 mg by mouth daily. )   polyethylene glycol powder (MIRALAX) powder Take 1 Container by mouth 2 (two) times daily as needed for mild constipation or moderate constipation.    ticagrelor (BRILINTA) 90 MG TABS tablet Take 1 tablet (90 mg total) by mouth 2 (two) times daily.   vitamin  B-12 (CYANOCOBALAMIN) 100 MCG tablet Take 100 mcg by mouth daily.     Allergies:   Oxycodone and Sulfa antibiotics   Social History   Tobacco Use   Smoking status: Current Every Day Smoker    Packs/day: 0.50    Years: 15.00    Pack years: 7.50    Types: Cigarettes   Smokeless tobacco: Never Used  Substance Use Topics   Alcohol use: Yes    Alcohol/week: 0.0 standard drinks    Comment: occasional   Drug use: No     Family Hx: The patient's family history includes Anemia in her mother. There is no history of Stomach cancer or Esophageal cancer.  ROS:   Please see the history of present illness.     All other systems reviewed and are negative.  Prior CV studies:   The following studies were reviewed today:  Cath: 11/14/18   Stuttering anterior septal myocardial infarction over the past 12 hours  99% proximal LAD with TIMI grade II flow treated with PCI using 2 seven 5 x 22 Onyx drug-eluting stent and postdilated to 3.0 mm in diameter with resultant TIMI grade III flow. 0% stenosis.  Normal left main.  First diagonal which arises proximal to the LAD culprit site contains diffuse 60% ostial to proximal stenosis.  Normal circumflex  Normal RCA, dominant.  RECOMMENDATIONS:   Dual antiplatelet therapy with aspirin and Brilinta x12 months.  Aggressive secondary risk factor modification per guideline recommendations.  Depending upon clinical course, would anticipate discharge in 24 to 36 hours.  Needs phase 2 cardiac rehab to help reinforce lifestyle changes necessary to improve outcome.  Diagnostic Dominance: Right  Intervention    TTE: 11/15/18  IMPRESSIONS  1. Severe hypokinesis of the left ventricular, apical anterior wall. 2. The left ventricle has mildly reduced systolic function, with an ejection fraction of 45-50%. The cavity size was normal. Left ventricular diastolic Doppler parameters are consistent with impaired relaxation. 3. The  right ventricle has normal systolic function. The cavity was normal. There is no increase in right ventricular wall thickness. 4. Left atrial size was mildly dilated. 5. The mitral valve is grossly normal. Moderate thickening of the mitral valve leaflet. 6. The tricuspid valve is grossly normal. 7. The aortic valve is tricuspid. 8. The aorta is normal unless otherwise noted.  Labs/Other Tests  and Data Reviewed:    EKG:  An ECG dated 11/15/2018 was personally reviewed today and demonstrated:  NSR with TWI in anterior and lateral leads, HR 70 bpm  Recent Labs: 11/15/2018: Magnesium 2.1 11/16/2018: BUN 16; Creatinine, Ser 0.74; Hemoglobin 13.6; Platelets 276; Potassium 3.9; Sodium 139   Recent Lipid Panel Lab Results  Component Value Date/Time   CHOL 138 11/15/2018 08:08 AM   TRIG 89 11/15/2018 08:08 AM   HDL 41 11/15/2018 08:08 AM   CHOLHDL 3.4 11/15/2018 08:08 AM   LDLCALC 79 11/15/2018 08:08 AM    Wt Readings from Last 3 Encounters:  12/13/18 138 lb (62.6 kg)  11/25/18 141 lb (64 kg)  11/16/18 142 lb 3.2 oz (64.5 kg)     Objective:    Vital Signs:  Ht 5\' 6"  (1.676 m)    Wt 138 lb (62.6 kg)    BMI 22.27 kg/m   VITAL SIGNS:  reviewed GEN:  no acute distress EYES:  sclerae anicteric, EOMI - Extraocular Movements Intact RESPIRATORY:  normal respiratory effort, symmetric expansion NEURO:  alert and oriented x 3, no obvious focal deficit PSYCH:  normal affect  ASSESSMENT & PLAN:    1.  CAD, acute anterior STEMI: -pLAD stent placed 8/16/202>> has residual 60% first diagonal lesion which is somewhat concerning given her more recent "throbbing" sensation -Her symptoms do not appear to be anginal given no relation to exertion with no associated symptoms.  Happens at work, not at home -Had long discussion about tracking symptoms for commonality.  Will monitor for now.  ED precautions reviewed if symptoms become intense such as her last MRI.  May need to add long-acting nitrates  in the future -Continue ASA and Brilinta>>compliant  -Continue statin, beta-blocker -May need further reduction in beta-blocker to initiate ARB in the future. -We will need repeat echocardiogram 01/2019 along with lipid and LFTs  2.  DM2: -Hemoglobin A1c, 6.0 -Continue current regimen, managed by PCP -Diet and exercise regimen discussed at length  3.  Tobacco abuse: -Cessation strongly encouraged -Congratulated as she has not smoked since hospital discharge    COVID-19 Education: The signs and symptoms of COVID-19 were discussed with the patient and how to seek care for testing (follow up with PCP or arrange E-visit).  The importance of social distancing was discussed today.  Time:   Today, I have spent 25 minutes with the patient with telehealth technology discussing the above problems.     Medication Adjustments/Labs and Tests Ordered: Current medicines are reviewed at length with the patient today.  Concerns regarding medicines are outlined above.   Tests Ordered: No orders of the defined types were placed in this encounter.   Medication Changes: No orders of the defined types were placed in this encounter.   Follow Up:  Virtual Visit Myself in 1 to 2 weeks  Signed, Kathyrn Drown, NP  12/13/2018 9:16 AM    [place]

## 2018-12-13 ENCOUNTER — Other Ambulatory Visit: Payer: Self-pay

## 2018-12-13 ENCOUNTER — Telehealth (INDEPENDENT_AMBULATORY_CARE_PROVIDER_SITE_OTHER): Payer: 59 | Admitting: Cardiology

## 2018-12-13 ENCOUNTER — Encounter (HOSPITAL_COMMUNITY): Payer: Self-pay

## 2018-12-13 ENCOUNTER — Encounter: Payer: Self-pay | Admitting: Cardiology

## 2018-12-13 VITALS — Ht 66.0 in | Wt 138.0 lb

## 2018-12-13 DIAGNOSIS — I5021 Acute systolic (congestive) heart failure: Secondary | ICD-10-CM

## 2018-12-13 DIAGNOSIS — I10 Essential (primary) hypertension: Secondary | ICD-10-CM

## 2018-12-13 DIAGNOSIS — I214 Non-ST elevation (NSTEMI) myocardial infarction: Secondary | ICD-10-CM | POA: Diagnosis not present

## 2018-12-13 NOTE — Patient Instructions (Signed)
Medication Instructions:  Your physician recommends that you continue on your current medications as directed. Please refer to the Current Medication list given to you today.  If you need a refill on your cardiac medications before your next appointment, please call your pharmacy.   Lab work: None   If you have labs (blood work) drawn today and your tests are completely normal, you will receive your results only by: Marland Kitchen MyChart Message (if you have MyChart) OR . A paper copy in the mail If you have any lab test that is abnormal or we need to change your treatment, we will call you to review the results.  Testing/Procedures: None   Follow-Up: You are scheduled to see Dr. [name] in 01/05/2019 @ 2:40 PM  You have been referred to [facility]  Any Other Special Instructions Will Be Listed Below (If Applicable).

## 2018-12-13 NOTE — Addendum Note (Signed)
Addended by: Mendel Ryder on: 12/13/2018 11:00 AM   Modules accepted: Orders

## 2018-12-13 NOTE — Addendum Note (Signed)
Addended by: Mendel Ryder on: 12/13/2018 09:23 AM   Modules accepted: Orders

## 2018-12-15 ENCOUNTER — Telehealth (HOSPITAL_COMMUNITY): Payer: Self-pay

## 2018-12-20 ENCOUNTER — Telehealth (HOSPITAL_COMMUNITY): Payer: Self-pay

## 2018-12-20 NOTE — Telephone Encounter (Signed)
Successful telephone encounter to Casey Mason to confirm Cardiac Rehab orientation appointment for 12/21/2018 at 0730. Nursing assessment completed. Instructions for appointment provided. Patient screening for Covid-19 negative. Raushanah Osmundson E. Rollene Rotunda, RN BSN

## 2018-12-20 NOTE — Telephone Encounter (Signed)
Cardiac Rehab Medication Review by a Pharmacist  Does the patient  feel that his/her medications are working for him/her?  yes  Has the patient been experiencing any side effects to the medications prescribed?  no  Does the patient measure his/her own blood pressure or blood glucose at home?  Yes. Patient monitors blood pressure once daily at a random time, ranges from 108-67. Monitors blood sugar in the morning and at bedtime. In morning usually 100-110s.   Does the patient have any problems obtaining medications due to transportation or finances?   Yes. Casey Mason is $30 and coworkers have $0 copay.   Understanding of regimen: good Understanding of indications: good Potential of compliance: good  Pharmacist comments: N/A  [name], PharmD PGY1 Pharmacy Resident [employer]: [phone]  12/20/2018 12:12 PM

## 2018-12-20 NOTE — Telephone Encounter (Signed)
Cardiac Rehab - Pharmacy Resident Documentation   Patient unable to be reached after three call attempts. Please complete allergy verification and medication review during patient's cardiac rehab appointment.   Thank you,   [name], PharmD PGY1 Pharmacy Resident [employer]: [phone]

## 2018-12-21 ENCOUNTER — Other Ambulatory Visit: Payer: Self-pay

## 2018-12-21 ENCOUNTER — Encounter (HOSPITAL_COMMUNITY): Payer: Self-pay

## 2018-12-21 ENCOUNTER — Encounter (HOSPITAL_COMMUNITY)
Admission: RE | Admit: 2018-12-21 | Discharge: 2018-12-21 | Disposition: A | Discharge status: Home / Self Care | Payer: 59 | Source: Ambulatory Visit | Attending: Interventional Cardiology | Admitting: Interventional Cardiology

## 2018-12-21 VITALS — BP 98/60 | HR 63 | Temp 98.1°F | Ht 65.0 in | Wt 141.8 lb

## 2018-12-21 DIAGNOSIS — Z955 Presence of coronary angioplasty implant and graft: Secondary | ICD-10-CM | POA: Insufficient documentation

## 2018-12-21 DIAGNOSIS — I2102 ST elevation (STEMI) myocardial infarction involving left anterior descending coronary artery: Secondary | ICD-10-CM | POA: Insufficient documentation

## 2018-12-21 NOTE — Progress Notes (Signed)
Cardiac Individual Treatment Plan  Patient Details  Name: Casey Mason MRN: [id number] Date of Birth: Feb 03, 1966 Referring Provider:     [facility] from 12/21/2018 in [place]  Referring Provider  Daneen Schick, MD      Initial Encounter Date:    CARDIAC REHAB PHASE II ORIENTATION from 12/21/2018 in [place]  Date  12/21/18      Visit Diagnosis: 11/14/18 STEMI  11/14/18 DES LAD  Patient's Home Medications on Admission:  Current Outpatient Medications:  .  Apple Cider Vinegar 500 MG TABS, Take 500 mg by mouth 2 (two) times daily. , Disp: , Rfl:  .  aspirin 81 MG tablet, Take 81 mg by mouth daily., Disp: , Rfl:  .  atorvastatin (LIPITOR) 80 MG tablet, Take 1 tablet (80 mg total) by mouth daily at 6 PM., Disp: 90 tablet, Rfl: 1 .  Cholecalciferol (VITAMIN D3) 2000 units TABS, Take 2 tablets by mouth daily. , Disp: , Rfl:  .  Cinnamon 500 MG capsule, Take 1,000 mg by mouth daily. , Disp: , Rfl:  .  Coenzyme Q10 100 MG capsule, Take 100 mg by mouth daily., Disp: , Rfl:  .  empagliflozin (JARDIANCE) 25 MG TABS tablet, Take 25 mg by mouth daily., Disp: , Rfl:  .  ibandronate (BONIVA) 150 MG tablet, Take 150 mg by mouth every 30 (thirty) days., Disp: , Rfl:  .  magnesium oxide (MAG-OX) 400 MG tablet, Take 400 mg by mouth daily., Disp: , Rfl:  .  metFORMIN (GLUCOPHAGE) 500 MG tablet, Take 1,000 mg by mouth 2 (two) times daily., Disp: , Rfl:  .  metoprolol tartrate (LOPRESSOR) 25 MG tablet, Take 0.5 tablets (12.5 mg total) by mouth 2 (two) times daily., Disp: 60 tablet, Rfl: 1 .  Multiple Vitamin (MULTIVITAMIN) tablet, Take 1 tablet by mouth daily., Disp: , Rfl:  .  pantoprazole (PROTONIX) 40 MG tablet, TAKE 1 TABLET BY MOUTH TWICE A DAY (Patient taking differently: Take 20 mg by mouth daily. ), Disp: 180 tablet, Rfl: 0 .  polyethylene glycol powder (MIRALAX) powder, Take 1 Container by mouth 2 (two)  times daily as needed for mild constipation or moderate constipation. , Disp: , Rfl:  .  ticagrelor (BRILINTA) 90 MG TABS tablet, Take 1 tablet (90 mg total) by mouth 2 (two) times daily., Disp: 180 tablet, Rfl: 2 .  vitamin B-12 (CYANOCOBALAMIN) 100 MCG tablet, Take 100 mcg by mouth daily., Disp: , Rfl:  .  COLLAGEN PO, Take 1,000 mg by mouth 2 (two) times daily., Disp: , Rfl:  .  nitroGLYCERIN (NITROSTAT) 0.4 MG SL tablet, Place 1 tablet (0.4 mg total) under the tongue every 5 (five) minutes x 3 doses as needed for chest pain. (Patient not taking: Reported on 12/21/2018), Disp: 25 tablet, Rfl: 2  Past Medical History: Past Medical History:  Diagnosis Date  . Diabetes mellitus without complication ([place])   . Diverticulosis of colon   . Family history of adverse reaction to anesthesia    mother-- ponv  . History of colon polyps    2016- hyperplastic  . History of diverticulitis of colon   . Nephrolithiasis    right  . PONV (postoperative nausea and vomiting)   . Prolapse of female bladder, acquired   . Right ureteral stone     Tobacco Use: Social History   Tobacco Use  Smoking Status Former Smoker  . Packs/day: 0.50  . Years: 15.00  .  Pack years: 7.50  . Types: Cigarettes  . Quit date: 11/14/2018  . Years since quitting: 0.1  Smokeless Tobacco Never Used    Labs: Recent Review Scientist, physiological    Labs for ITP Cardiac and Pulmonary Rehab Latest Ref Rng & Units 11/15/2018   Cholestrol 0 - 200 mg/dL 138   LDLCALC 0 - 99 mg/dL 79   HDL >40 mg/dL 41   Trlycerides <150 mg/dL 89   Hemoglobin A1c 4.8 - 5.6 % 6.0(H)      Capillary Blood Glucose: Lab Results  Component Value Date   GLUCAP 160 (H) 11/16/2018   GLUCAP 160 (H) 11/16/2018   GLUCAP 149 (H) 11/15/2018   GLUCAP 223 (H) 11/15/2018   GLUCAP 52 (L) 11/15/2018     Exercise Target Goals: Exercise Program Goal: Individual exercise prescription set using results from initial 6 min walk test and THRR while considering   patient's activity barriers and safety.   Exercise Prescription Goal: Initial exercise prescription builds to 30-45 minutes a day of aerobic activity, 2-3 days per week.  Home exercise guidelines will be given to patient during program as part of exercise prescription that the participant will acknowledge.  Activity Barriers & Risk Stratification: Activity Barriers & Cardiac Risk Stratification - 12/21/18 0843      Activity Barriers & Cardiac Risk Stratification   Activity Barriers  Other (comment)    Comments  History of right rotator cuff surgery, arthritis in top joint of left index finger, history of vertigo.    Cardiac Risk Stratification  High       6 Minute Walk: 6 Minute Walk    Row Name 12/21/18 0757         6 Minute Walk   Phase  Initial     Distance  1641 feet     Walk Time  6 minutes     # of Rest Breaks  0     MPH  3.11     METS  4.28     RPE  11     Perceived Dyspnea   0     VO2 Peak  14.99     Symptoms  No     Resting HR  63 bpm     Resting BP  98/60     Resting Oxygen Saturation   96 %     Exercise Oxygen Saturation  during 6 min walk  99 %     Max Ex. HR  92 bpm     Max Ex. BP  104/70     2 Minute Post BP  100/66        Oxygen Initial Assessment:   Oxygen Re-Evaluation:   Oxygen Discharge (Final Oxygen Re-Evaluation):   Initial Exercise Prescription: Initial Exercise Prescription - 12/21/18 0900      Date of Initial Exercise RX and Referring Provider   Date  12/21/18    Referring Provider  Daneen Schick, MD    Expected Discharge Date  02/18/19      Treadmill   MPH  3    Grade  0    Minutes  15    METs  3.3      NuStep   Level  3    SPM  85    Minutes  15    METs  3      Prescription Details   Frequency (times per week)  3    Duration  Progress to 30 minutes of continuous aerobic without signs/symptoms of  physical distress      Intensity   THRR 40-80% of Max Heartrate  66-133    Ratings of Perceived Exertion  11-13     Perceived Dyspnea  0-4      Progression   Progression  Continue to progress workloads to maintain intensity without signs/symptoms of physical distress.      Resistance Training   Training Prescription  Yes    Weight  3lbs    Reps  10-15       Perform Capillary Blood Glucose checks as needed.  Exercise Prescription Changes:   Exercise Comments:   Exercise Goals and Review:  Exercise Goals    Row Name 12/21/18 0747             Exercise Goals   Increase Physical Activity  Yes       Intervention  Provide advice, education, support and counseling about physical activity/exercise needs.;Develop an individualized exercise prescription for aerobic and resistive training based on initial evaluation findings, risk stratification, comorbidities and participant's personal goals.       Expected Outcomes  Short Term: Attend rehab on a regular basis to increase amount of physical activity.;Long Term: Exercising regularly at least 3-5 days a week.;Long Term: Add in home exercise to make exercise part of routine and to increase amount of physical activity.       Increase Strength and Stamina  Yes       Intervention  Provide advice, education, support and counseling about physical activity/exercise needs.;Develop an individualized exercise prescription for aerobic and resistive training based on initial evaluation findings, risk stratification, comorbidities and participant's personal goals.       Expected Outcomes  Short Term: Increase workloads from initial exercise prescription for resistance, speed, and METs.;Short Term: Perform resistance training exercises routinely during rehab and add in resistance training at home;Long Term: Improve cardiorespiratory fitness, muscular endurance and strength as measured by increased METs and functional capacity (6MWT)       Able to understand and use rate of perceived exertion (RPE) scale  Yes       Intervention  Provide education and explanation on how  to use RPE scale       Expected Outcomes  Short Term: Able to use RPE daily in rehab to express subjective intensity level;Long Term:  Able to use RPE to guide intensity level when exercising independently       Knowledge and understanding of Target Heart Rate Range (THRR)  Yes       Intervention  Provide education and explanation of THRR including how the numbers were predicted and where they are located for reference       Expected Outcomes  Short Term: Able to state/look up THRR;Long Term: Able to use THRR to govern intensity when exercising independently;Short Term: Able to use daily as guideline for intensity in rehab       Able to check pulse independently  Yes       Intervention  Provide education and demonstration on how to check pulse in carotid and radial arteries.;Review the importance of being able to check your own pulse for safety during independent exercise       Expected Outcomes  Short Term: Able to explain why pulse checking is important during independent exercise;Long Term: Able to check pulse independently and accurately       Understanding of Exercise Prescription  Yes       Intervention  Provide education, explanation, and written materials on patient's individual exercise prescription  Expected Outcomes  Short Term: Able to explain program exercise prescription;Long Term: Able to explain home exercise prescription to exercise independently          Exercise Goals Re-Evaluation :   Discharge Exercise Prescription (Final Exercise Prescription Changes):   Nutrition:  Target Goals: Understanding of nutrition guidelines, daily intake of sodium 1500mg , cholesterol 200mg , calories 30% from fat and 7% or less from saturated fats, daily to have 5 or more servings of fruits and vegetables.  Biometrics: Pre Biometrics - 12/21/18 0747      Pre Biometrics   Height  5\' 5"  (1.651 m)    Weight  64.3 kg    Waist Circumference  31.75 inches    Hip Circumference  38 inches     Waist to Hip Ratio  0.84 %    BMI (Calculated)  23.59    Triceps Skinfold  16 mm    % Body Fat  31.6 %    Grip Strength  35.5 kg    Flexibility  13 in    Single Leg Stand  30 seconds        Nutrition Therapy Plan and Nutrition Goals:   Nutrition Assessments:   Nutrition Goals Re-Evaluation:   Nutrition Goals Re-Evaluation:   Nutrition Goals Discharge (Final Nutrition Goals Re-Evaluation):   Psychosocial: Target Goals: Acknowledge presence or absence of significant depression and/or stress, maximize coping skills, provide positive support system. Participant is able to verbalize types and ability to use techniques and skills needed for reducing stress and depression.  Initial Review & Psychosocial Screening: Initial Psych Review & Screening - 12/21/18 0904      Initial Review   Current issues with  Current Stress Concerns    Source of Stress Concerns  Chronic Illness;Occupation    Comments  Ms. Sorbello admits to life stressors including her adjustment to having a diagnosis of MI/CAD and work. She feels these stressors are managable with family support and will not be a barrier to her participation in CR. She will continue to maintain a positive attitude and utilize family, friends, and healthcare team for support.      Family Dynamics   Good Support System?  Yes      Barriers   Psychosocial barriers to participate in program  The patient should benefit from training in stress management and relaxation.;Psychosocial barriers identified (see note)      Screening Interventions   Interventions  Encouraged to exercise;To provide support and resources with identified psychosocial needs;Provide feedback about the scores to participant    Expected Outcomes  Short Term goal: Utilizing psychosocial counselor, staff and physician to assist with identification of specific Stressors or current issues interfering with healing process. Setting desired goal for each stressor or current  issue identified.;Long Term Goal: Stressors or current issues are controlled or eliminated.;Short Term goal: Identification and review with participant of any Quality of Life or Depression concerns found by scoring the questionnaire.;Long Term goal: The participant improves quality of Life and PHQ9 Scores as seen by post scores and/or verbalization of changes       Quality of Life Scores: Quality of Life - 12/21/18 0931      Quality of Life   Select  Quality of Life      Quality of Life Scores   Health/Function Pre  17.27 %    Socioeconomic Pre  20.71 %    Psych/Spiritual Pre  18.5 %    Family Pre  22.7 %    GLOBAL Pre  19.03 %      Scores of 19 and below usually indicate a poorer quality of life in these areas.  A difference of  2-3 points is a clinically meaningful difference.  A difference of 2-3 points in the total score of the Quality of Life Index has been associated with significant improvement in overall quality of life, self-image, physical symptoms, and general health in studies assessing change in quality of life.  PHQ-9: Recent Review Flowsheet Data    Depression screen [facility] 2/9 12/21/2018 03/12/2018   Decreased Interest 0 0   Down, Depressed, Hopeless 0 0   PHQ - 2 Score 0 0     Interpretation of Total Score  Total Score Depression Severity:  1-4 = Minimal depression, 5-9 = Mild depression, 10-14 = Moderate depression, 15-19 = Moderately severe depression, 20-27 = Severe depression   Psychosocial Evaluation and Intervention:   Psychosocial Re-Evaluation:   Psychosocial Discharge (Final Psychosocial Re-Evaluation):   Vocational Rehabilitation: Provide vocational rehab assistance to qualifying candidates.   Vocational Rehab Evaluation & Intervention: Vocational Rehab - 12/21/18 [id number]      Initial Vocational Rehab Evaluation & Intervention   Assessment shows need for Vocational Rehabilitation  No       Education: Education Goals: Education classes will be  provided on a weekly basis, covering required topics. Participant will state understanding/return demonstration of topics presented.  Learning Barriers/Preferences:   Education Topics: Count Your Pulse:  -Group instruction provided by verbal instruction, demonstration, patient participation and written materials to support subject.  Instructors address importance of being able to find your pulse and how to count your pulse when at home without a heart monitor.  Patients get hands on experience counting their pulse with staff help and individually.   Heart Attack, Angina, and Risk Factor Modification:  -Group instruction provided by verbal instruction, video, and written materials to support subject.  Instructors address signs and symptoms of angina and heart attacks.    Also discuss risk factors for heart disease and how to make changes to improve heart health risk factors.   Functional Fitness:  -Group instruction provided by verbal instruction, demonstration, patient participation, and written materials to support subject.  Instructors address safety measures for doing things around the house.  Discuss how to get up and down off the floor, how to pick things up properly, how to safely get out of a chair without assistance, and balance training.   Meditation and Mindfulness:  -Group instruction provided by verbal instruction, patient participation, and written materials to support subject.  Instructor addresses importance of mindfulness and meditation practice to help reduce stress and improve awareness.  Instructor also leads participants through a meditation exercise.    Stretching for Flexibility and Mobility:  -Group instruction provided by verbal instruction, patient participation, and written materials to support subject.  Instructors lead participants through series of stretches that are designed to increase flexibility thus improving mobility.  These stretches are additional exercise  for major muscle groups that are typically performed during regular warm up and cool down.   Hands Only CPR:  -Group verbal, video, and participation provides a basic overview of AHA guidelines for community CPR. Role-play of emergencies allow participants the opportunity to practice calling for help and chest compression technique with discussion of AED use.   Hypertension: -Group verbal and written instruction that provides a basic overview of hypertension including the most recent diagnostic guidelines, risk factor reduction with self-care instructions and medication management.    Nutrition  I class: Heart Healthy Eating:  -Group instruction provided by PowerPoint slides, verbal discussion, and written materials to support subject matter. The instructor gives an explanation and review of the Therapeutic Lifestyle Changes diet recommendations, which includes a discussion on lipid goals, dietary fat, sodium, fiber, plant stanol/sterol esters, sugar, and the components of a well-balanced, healthy diet.   Nutrition II class: Lifestyle Skills:  -Group instruction provided by PowerPoint slides, verbal discussion, and written materials to support subject matter. The instructor gives an explanation and review of label reading, grocery shopping for heart health, heart healthy recipe modifications, and ways to make healthier choices when eating out.   Diabetes Question & Answer:  -Group instruction provided by PowerPoint slides, verbal discussion, and written materials to support subject matter. The instructor gives an explanation and review of diabetes co-morbidities, pre- and post-prandial blood glucose goals, pre-exercise blood glucose goals, signs, symptoms, and treatment of hypoglycemia and hyperglycemia, and foot care basics.   Diabetes Blitz:  -Group instruction provided by PowerPoint slides, verbal discussion, and written materials to support subject matter. The instructor gives an  explanation and review of the physiology behind type 1 and type 2 diabetes, diabetes medications and rational behind using different medications, pre- and post-prandial blood glucose recommendations and Hemoglobin A1c goals, diabetes diet, and exercise including blood glucose guidelines for exercising safely.    Portion Distortion:  -Group instruction provided by PowerPoint slides, verbal discussion, written materials, and food models to support subject matter. The instructor gives an explanation of serving size versus portion size, changes in portions sizes over the last 20 years, and what consists of a serving from each food group.   Stress Management:  -Group instruction provided by verbal instruction, video, and written materials to support subject matter.  Instructors review role of stress in heart disease and how to cope with stress positively.     Exercising on Your Own:  -Group instruction provided by verbal instruction, power point, and written materials to support subject.  Instructors discuss benefits of exercise, components of exercise, frequency and intensity of exercise, and end points for exercise.  Also discuss use of nitroglycerin and activating EMS.  Review options of places to exercise outside of rehab.  Review guidelines for sex with heart disease.   Cardiac Drugs I:  -Group instruction provided by verbal instruction and written materials to support subject.  Instructor reviews cardiac drug classes: antiplatelets, anticoagulants, beta blockers, and statins.  Instructor discusses reasons, side effects, and lifestyle considerations for each drug class.   Cardiac Drugs II:  -Group instruction provided by verbal instruction and written materials to support subject.  Instructor reviews cardiac drug classes: angiotensin converting enzyme inhibitors (ACE-I), angiotensin II receptor blockers (ARBs), nitrates, and calcium channel blockers.  Instructor discusses reasons, side effects,  and lifestyle considerations for each drug class.   Anatomy and Physiology of the Circulatory System:  Group verbal and written instruction and models provide basic cardiac anatomy and physiology, with the coronary electrical and arterial systems. Review of: AMI, Angina, Valve disease, Heart Failure, Peripheral Artery Disease, Cardiac Arrhythmia, Pacemakers, and the ICD.   Other Education:  -Group or individual verbal, written, or video instructions that support the educational goals of the cardiac rehab program.   Holiday Eating Survival Tips:  -Group instruction provided by PowerPoint slides, verbal discussion, and written materials to support subject matter. The instructor gives patients tips, tricks, and techniques to help them not only survive but enjoy the holidays despite the onslaught of food that accompanies  the holidays.   Knowledge Questionnaire Score: Knowledge Questionnaire Score - 12/21/18 0931      Knowledge Questionnaire Score   Pre Score  25/28       Core Components/Risk Factors/Patient Goals at Admission: Personal Goals and Risk Factors at Admission - 12/21/18 0854      Core Components/Risk Factors/Patient Goals on Admission   Tobacco Cessation  Yes    Number of packs per day  0.5    Intervention  Offer self-teaching materials, assist with locating and accessing local/national Quit Smoking programs, and support quit date choice.;Assist the participant in steps to quit. Provide individualized education and counseling about committing to Tobacco Cessation, relapse prevention, and pharmacological support that can be provided by physician.    Expected Outcomes  Short Term: Will demonstrate readiness to quit, by selecting a quit date.;Long Term: Complete abstinence from all tobacco products for at least 12 months from quit date.;Short Term: Will quit all tobacco product use, adhering to prevention of relapse plan.    Diabetes  Yes    Intervention  Provide education about  signs/symptoms and action to take for hypo/hyperglycemia.;Provide education about proper nutrition, including hydration, and aerobic/resistive exercise prescription along with prescribed medications to achieve blood glucose in normal ranges: Fasting glucose 65-99 mg/dL    Expected Outcomes  Short Term: Participant verbalizes understanding of the signs/symptoms and immediate care of hyper/hypoglycemia, proper foot care and importance of medication, aerobic/resistive exercise and nutrition plan for blood glucose control.;Long Term: Attainment of HbA1C < 7%.    Stress  Yes    Intervention  Offer individual and/or small group education and counseling on adjustment to heart disease, stress management and health-related lifestyle change. Teach and support self-help strategies.;Refer participants experiencing significant psychosocial distress to appropriate mental health specialists for further evaluation and treatment. When possible, include family members and significant others in education/counseling sessions.    Expected Outcomes  Short Term: Participant demonstrates changes in health-related behavior, relaxation and other stress management skills, ability to obtain effective social support, and compliance with psychotropic medications if prescribed.;Long Term: Emotional wellbeing is indicated by absence of clinically significant psychosocial distress or social isolation.       Core Components/Risk Factors/Patient Goals Review:    Core Components/Risk Factors/Patient Goals at Discharge (Final Review):    ITP Comments: ITP Comments    Row Name 12/21/18 0733           ITP Comments  Medical Director- Dr. Fransico Him, MD          Comments: Patient attended orientation on 12/21/2018 to review rules and guidelines for program.  Completed 6 minute walk test, Intitial ITP, and exercise prescription.  VSS. Telemetry-NSR.  Asymptomatic. Safety measures and social distancing in place per CDC guidelines.

## 2018-12-27 ENCOUNTER — Encounter (HOSPITAL_COMMUNITY)
Admission: RE | Admit: 2018-12-27 | Discharge: 2018-12-27 | Disposition: A | Discharge status: Home / Self Care | Payer: 59 | Source: Ambulatory Visit | Attending: Interventional Cardiology | Admitting: Interventional Cardiology

## 2018-12-27 ENCOUNTER — Other Ambulatory Visit: Payer: Self-pay

## 2018-12-27 DIAGNOSIS — I2102 ST elevation (STEMI) myocardial infarction involving left anterior descending coronary artery: Secondary | ICD-10-CM

## 2018-12-27 DIAGNOSIS — Z955 Presence of coronary angioplasty implant and graft: Secondary | ICD-10-CM | POA: Diagnosis present

## 2018-12-27 LAB — GLUCOSE, CAPILLARY: Glucose-Capillary: 121 mg/dL — ABNORMAL HIGH (ref 70–99)

## 2018-12-27 NOTE — Progress Notes (Signed)
Daily Session Note  Patient Details  Name: Casey Mason MRN: [id number] Date of Birth: 1965-06-11 Referring Provider:     [place] from 12/21/2018 in [place]  Referring Provider  Daneen Schick, MD      Encounter Date: 12/27/2018  Check In: Session Check In - 12/27/18 1521      Check-In   Supervising physician immediately available to respond to emergencies  [facility] immediately available    Physician(s)  Dr. Marthenia Rolling    Location  [facility]    Staff Present  Jiles Garter, RN, Luisa Hart, RN, Deland Pretty, MS, ACSM CEP, Exercise Physiologist;Brittany Durene Fruits, BS, ACSM CEP, Exercise Physiologist    Virtual Visit  No    Medication changes reported      No    Fall or balance concerns reported     No    Tobacco Cessation  No Change    Warm-up and Cool-down  Performed on first and last piece of equipment    Resistance Training Performed  Yes    VAD Patient?  No    PAD/SET Patient?  No      Pain Assessment   Currently in Pain?  No/denies    Multiple Pain Sites  No       Capillary Blood Glucose: Results for orders placed or performed during the hospital encounter of 12/27/18 (from the past 24 hour(s))  Glucose, capillary     Status: Abnormal   Collection Time: 12/27/18  4:22 PM  Result Value Ref Range   Glucose-Capillary 121 (H) 70 - 99 mg/dL      Social History   Tobacco Use  Smoking Status Former Smoker  . Packs/day: 0.50  . Years: 15.00  . Pack years: 7.50  . Types: Cigarettes  . Quit date: 11/14/2018  . Years since quitting: 0.1  Smokeless Tobacco Never Used    Goals Met:  Exercise tolerated well No report of cardiac concerns or symptoms Strength training completed today  Goals Unmet:  Not Applicable  Comments: Pt started cardiac rehab today.  Pt tolerated light exercise without difficulty. VSS, telemetry-NSR, asymptomatic.  Medication list reconciled. Pt denies  barriers to medicaiton compliance.  PSYCHOSOCIAL ASSESSMENT:  PHQ-0. Pt exhibits positive coping skills, hopeful outlook with supportive family.Pt completed Quality of Life survey as a participant in Cardiac Rehab.  Scores 21.0 or below are considered low.  Pt score very low in several areas Overall 19.03, Health and Function 20.71, socioeconomic 20.71, physiological and spiritual 18.50, family 22.7. Patient quality of life slightly altered by physical constraints which limits ability to perform as prior to recent cardiac illness. She feels her QOL will improve as she continues to modify her risk factors and becomes more confident in her lifestyle changes. Offered emotional support and reassurance. Patient agreed to notify CR RN if she desires a referral to spiritual care. Will continue to monitor and intervene as necessary.     Dr. Fransico Him is Medical Director for Cardiac Rehab at [facility].

## 2018-12-28 LAB — GLUCOSE, CAPILLARY: Glucose-Capillary: 145 mg/dL — ABNORMAL HIGH (ref 70–99)

## 2018-12-29 ENCOUNTER — Encounter (HOSPITAL_COMMUNITY): Payer: 59

## 2018-12-29 ENCOUNTER — Telehealth: Payer: Self-pay | Admitting: Interventional Cardiology

## 2018-12-29 NOTE — Telephone Encounter (Signed)
FMLA form placed in box for Dr. [name] to review. 12/29/18 vlm

## 2018-12-31 ENCOUNTER — Encounter (HOSPITAL_COMMUNITY): Payer: 59

## 2019-01-03 ENCOUNTER — Encounter (HOSPITAL_COMMUNITY)
Admission: RE | Admit: 2019-01-03 | Discharge: 2019-01-03 | Disposition: A | Discharge status: Home / Self Care | Payer: 59 | Source: Ambulatory Visit | Attending: Interventional Cardiology | Admitting: Interventional Cardiology

## 2019-01-03 ENCOUNTER — Other Ambulatory Visit: Payer: Self-pay

## 2019-01-03 DIAGNOSIS — Z955 Presence of coronary angioplasty implant and graft: Secondary | ICD-10-CM | POA: Diagnosis present

## 2019-01-03 DIAGNOSIS — I2102 ST elevation (STEMI) myocardial infarction involving left anterior descending coronary artery: Secondary | ICD-10-CM

## 2019-01-03 LAB — GLUCOSE, CAPILLARY
Glucose-Capillary: 114 mg/dL — ABNORMAL HIGH (ref 70–99)
Glucose-Capillary: 98 mg/dL (ref 70–99)

## 2019-01-04 NOTE — Progress Notes (Signed)
Cardiology Office Note:    Date:  01/05/2019   ID:  Casey Mason, DOB 1965/11/12, MRN [id number]  PCP:  Casey Percy, MD  Cardiologist:  Casey Grooms, MD   Referring MD: Casey Percy, MD   Chief Complaint  Patient presents with  . Atrial Fibrillation    History of Present Illness:    Casey Mason is a 53 y.o. female with a hx of DM II, tobacco use, hypertension,  hyperlipidemia, and CAD with anterior STEMI and LAD stent 11/14/2018.  [place] doing okay.  She had an anterior non-ST elevation myocardial infarction treated as a STEMI and August.  She had LAD stenting with a good result.  LV function was low normal in the 45 to 50% range.  She has not been short of breath.  She is in phase 2 cardiac rehab.  She has not had significant recurrences of chest pain.  She has stopped smoking cigarettes.  She is compliant with current medication regimen.  Past Medical History:  Diagnosis Date  . Diabetes mellitus without complication ([place])   . Diverticulosis of colon   . Family history of adverse reaction to anesthesia    mother-- ponv  . History of colon polyps    2016- hyperplastic  . History of diverticulitis of colon   . Nephrolithiasis    right  . PONV (postoperative nausea and vomiting)   . Prolapse of female bladder, acquired   . Right ureteral stone     Past Surgical History:  Procedure Laterality Date  . BLADDER SUSPENSION    . CESAREAN SECTION  1988  . COLONOSCOPY  11-10-2014  in [place]  . CORONARY STENT INTERVENTION N/A 11/14/2018   Procedure: CORONARY STENT INTERVENTION;  Surgeon: Casey Crome, MD;  Location: [place] [facility];  Service: Cardiovascular;  Laterality: N/A;  . CORONARY/GRAFT ACUTE MI REVASCULARIZATION N/A 11/14/2018   Procedure: Coronary/Graft Acute MI Revascularization;  Surgeon: Casey Crome, MD;  Location: [place] [facility];  Service: Cardiovascular;  Laterality: N/A;  . CYSTOSCOPY WITH RETROGRADE PYELOGRAM, URETEROSCOPY AND STENT PLACEMENT  Right 02/07/2016   Procedure: CYSTOSCOPY WITH RIGHT PYLEOGRAM  RIGHT URETEROSCOPY, , DIALATION OF RIGHT URETER STRICUTURE FLEXIBLE RIGHT URETEROSCOPY, RIGHT PYELOSCOPY,  AND RIGHT DOUBLE STENT PLACEMENT WITH TETHER;  Surgeon: Casey Clines, MD;  Location: [place];  Service: Urology;  Laterality: Right;  . EXTRACORPOREAL SHOCK WAVE LITHOTRIPSY Right 09/20/2015  . LEFT HEART CATH AND CORONARY ANGIOGRAPHY N/A 11/14/2018   Procedure: LEFT HEART CATH AND CORONARY ANGIOGRAPHY;  Surgeon: Casey Crome, MD;  Location: [place] [facility];  Service: Cardiovascular;  Laterality: N/A;  . ROTATOR CUFF REPAIR Right 2013 or 2014??  . TONSILLECTOMY  2003  . TUBAL LIGATION  1992  . VAGINAL HYSTERECTOMY  2005    Current Medications: Current Meds  Medication Sig  . aspirin 81 MG tablet Take 81 mg by mouth daily.  Marland Kitchen atorvastatin (LIPITOR) 80 MG tablet Take 1 tablet (80 mg total) by mouth daily at 6 PM.  . Cholecalciferol (VITAMIN D3) 2000 units TABS Take 2 tablets by mouth daily.   . Cinnamon 500 MG capsule Take 1,000 mg by mouth daily.   . Coenzyme Q10 100 MG capsule Take 100 mg by mouth daily.  . COLLAGEN PO Take 1,000 mg by mouth 2 (two) times daily.  . empagliflozin (JARDIANCE) 25 MG TABS tablet Take 25 mg by mouth daily.  Marland Kitchen ibandronate (BONIVA) 150 MG tablet Take 150 mg by mouth every  30 (thirty) days.  . magnesium oxide (MAG-OX) 400 MG tablet Take 400 mg by mouth daily.  . metFORMIN (GLUCOPHAGE) 500 MG tablet Take 1,000 mg by mouth 2 (two) times daily.  . metoprolol tartrate (LOPRESSOR) 25 MG tablet Take 0.5 tablets (12.5 mg total) by mouth 2 (two) times daily.  . Multiple Vitamin (MULTIVITAMIN) tablet Take 1 tablet by mouth daily.  . nitroGLYCERIN (NITROSTAT) 0.4 MG SL tablet Place 1 tablet (0.4 mg total) under the tongue every 5 (five) minutes x 3 doses as needed for chest pain.  . pantoprazole (PROTONIX) 20 MG tablet Take 20 mg by mouth daily.  . ticagrelor (BRILINTA) 90 MG  TABS tablet Take 1 tablet (90 mg total) by mouth 2 (two) times daily.  . vitamin B-12 (CYANOCOBALAMIN) 100 MCG tablet Take 100 mcg by mouth daily.     Allergies:   Oxycodone and Sulfa antibiotics   Social History   Socioeconomic History  . Marital status: Married    Spouse name: Casey Mason  . Number of children: 2  . Years of education: 32  . Highest education level: Associate degree: occupational, Hotel manager, or vocational program  Occupational History  . Not on file  Social Needs  . Financial resource strain: Not hard at all  . Food insecurity    Worry: Never true    Inability: Never true  . Transportation needs    Medical: No    Non-medical: No  Tobacco Use  . Smoking status: Former Smoker    Packs/day: 0.50    Years: 15.00    Pack years: 7.50    Types: Cigarettes    Quit date: 11/14/2018    Years since quitting: 0.1  . Smokeless tobacco: Never Used  Substance and Sexual Activity  . Alcohol use: Yes    Alcohol/week: 0.0 standard drinks    Comment: occasional  . Drug use: No  . Sexual activity: Not on file  Lifestyle  . Physical activity    Days per week: 6 days    Minutes per session: 30 min  . Stress: To some extent  Relationships  . Social connections    Talks on phone: More than three times a week    Gets together: Never    Attends religious service: 1 to 4 times per year    Active member of club or organization: No    Attends meetings of clubs or organizations: Never    Relationship status: Married  Other Topics Concern  . Not on file  Social History Narrative  . Not on file     Family History: The patient's family history includes Anemia in her mother; Emphysema in her mother. There is no history of Stomach cancer or Esophageal cancer.  ROS:   Please see the history of present illness.    Anxiety concerning her longevity and prognosis.  All other systems reviewed and are negative.  EKGs/Labs/Other Studies Reviewed:    The following studies were  reviewed today:  Post Intervention Anatomy 2020: Intervention   ECHOCARDIOGRAM 2020: IMPRESSIONS    1. Severe hypokinesis of the left ventricular, apical anterior wall.  2. The left ventricle has mildly reduced systolic function, with an ejection fraction of 45-50%. The cavity size was normal. Left ventricular diastolic Doppler parameters are consistent with impaired relaxation.  3. The right ventricle has normal systolic function. The cavity was normal. There is no increase in right ventricular wall thickness.  4. Left atrial size was mildly dilated.  5. The mitral valve is grossly  normal. Moderate thickening of the mitral valve leaflet.  6. The tricuspid valve is grossly normal.  7. The aortic valve is tricuspid.  8. The aorta is normal unless otherwise noted.   EKG:  EKG IS not repeated  Recent Labs: 11/15/2018: Magnesium 2.1 11/16/2018: BUN 16; Creatinine, Ser 0.74; Hemoglobin 13.6; Platelets 276; Potassium 3.9; Sodium 139  Recent Lipid Panel    Component Value Date/Time   CHOL 138 11/15/2018 0808   TRIG 89 11/15/2018 0808   HDL 41 11/15/2018 0808   CHOLHDL 3.4 11/15/2018 0808   VLDL 18 11/15/2018 0808   LDLCALC 79 11/15/2018 0808    Physical Exam:    VS:  BP (!) 88/58   Pulse 89   Ht 5\' 5"  (1.651 m)   Wt 143 lb (64.9 kg)   SpO2 95%   BMI 23.80 kg/m     Wt Readings from Last 3 Encounters:  01/05/19 143 lb (64.9 kg)  12/21/18 141 lb 12.1 oz (64.3 kg)  12/13/18 138 lb (62.6 kg)     GEN: Relatively young appearing. No acute distress HEENT: Normal NECK: No JVD. LYMPHATICS: No lymphadenopathy CARDIAC:  RRR without murmur, gallop, or edema. VASCULAR:  Normal Pulses. No bruits. RESPIRATORY:  Clear to auscultation without rales, wheezing or rhonchi  ABDOMEN: Soft, non-tender, non-distended, No pulsatile mass, MUSCULOSKELETAL: No deformity  SKIN: Warm and dry NEUROLOGIC:  Alert and oriented x 3 PSYCHIATRIC:  Normal affect   ASSESSMENT:    1. Non-ST  elevation (NSTEMI) myocardial infarction ([place])   2. Essential hypertension   3. Acute systolic heart failure ([place])   4. Type 2 diabetes mellitus with complication, with long-term current use of insulin ([place])   5. Hyperlipidemia with target LDL less than 70   6. Tobacco abuse   7. Educated about COVID-19 virus infection    PLAN:    In order of problems listed above:  1. Coronary artery disease with high-grade obstruction in the LAD treated with stent in August 2020.  Doing well.  Secondary prevention discussed in detail.  She is use nitroglycerin twice since her myocardial infarction for nagging localized left parasternal discomfort.  States that the discomfort went away with nitroglycerin use.  The discomfort was totally dissimilar to the distribution and quality of the pain that she had during her acute syndrome. 2. Blood pressure was initially relatively low.  After I rechecked it it was 125/70 mmHg.  Only medication that will cause decrease in blood pressure is low-dose metoprolol.  No changes are made. 3. No evidence of volume overload or clinical symptoms suggestive of heart failure.  LVEF acutely was 45 to 50% and should be back in the normal range at this time. 4. Hemoglobin A1c is less than 7. 5. LDL less than 70 is target. 6. She has stopped smoking. 7. Handwashing, mask wearing, and social distancing is encouraged.  Overall education and awareness concerning primary/secondary risk prevention was discussed in detail: LDL less than 70, hemoglobin A1c less than 7, blood pressure target less than 130/80 mmHg, >150 minutes of moderate aerobic activity per week, avoidance of smoking, weight control (via diet and exercise), and continued surveillance/management of/for obstructive sleep apnea.  She will return in 6 months.  At that time we will consider switching to Plavix from Brilinta.  She is reassured concerning her prognosis.  I think she is doing great.   Medication  Adjustments/Labs and Tests Ordered: Current medicines are reviewed at length with the patient today.  Concerns regarding medicines are  outlined above.  Orders Placed This Encounter  Procedures  . Lipid panel  . Hepatic function panel   No orders of the defined types were placed in this encounter.   Patient Instructions  Medication Instructions:  Your physician recommends that you continue on your current medications as directed. Please refer to the Current Medication list given to you today.  If you need a refill on your cardiac medications before your next appointment, please call your pharmacy.   Lab work: Lipid and Liver when you are fasting sometime in the next two months.  If you have labs (blood work) drawn today and your tests are completely normal, you will receive your results only by: Marland Kitchen MyChart Message (if you have MyChart) OR . A paper copy in the mail If you have any lab test that is abnormal or we need to change your treatment, we will call you to review the results.  Testing/Procedures: None  Follow-Up: At [facility], you and your health needs are our priority.  As part of our continuing mission to provide you with exceptional heart care, we have created designated Provider Care Teams.  These Care Teams include your primary Cardiologist (physician) and Advanced Practice Providers (APPs -  Physician Assistants and Nurse Practitioners) who all work together to provide you with the care you need, when you need it. You will need a follow up appointment in 6 months.  Please call our office 2 months in advance to schedule this appointment.  You may see Casey Grooms, MD or one of the following Advanced Practice Providers on your designated Care Team:   Truitt Merle, NP Cecilie Kicks, NP . Kathyrn Drown, NP  Any Other Special Instructions Will Be Listed Below (If Applicable).       Signed, Casey Grooms, MD  01/05/2019 3:35 PM    [facility]

## 2019-01-05 ENCOUNTER — Ambulatory Visit: Payer: 59 | Admitting: Interventional Cardiology

## 2019-01-05 ENCOUNTER — Encounter: Payer: Self-pay | Admitting: Interventional Cardiology

## 2019-01-05 ENCOUNTER — Encounter (HOSPITAL_COMMUNITY): Payer: 59

## 2019-01-05 ENCOUNTER — Other Ambulatory Visit: Payer: Self-pay

## 2019-01-05 VITALS — BP 88/58 | HR 89 | Ht 65.0 in | Wt 143.0 lb

## 2019-01-05 DIAGNOSIS — I5021 Acute systolic (congestive) heart failure: Secondary | ICD-10-CM | POA: Diagnosis not present

## 2019-01-05 DIAGNOSIS — E785 Hyperlipidemia, unspecified: Secondary | ICD-10-CM

## 2019-01-05 DIAGNOSIS — I214 Non-ST elevation (NSTEMI) myocardial infarction: Secondary | ICD-10-CM

## 2019-01-05 DIAGNOSIS — E118 Type 2 diabetes mellitus with unspecified complications: Secondary | ICD-10-CM

## 2019-01-05 DIAGNOSIS — Z794 Long term (current) use of insulin: Secondary | ICD-10-CM

## 2019-01-05 DIAGNOSIS — I10 Essential (primary) hypertension: Secondary | ICD-10-CM

## 2019-01-05 DIAGNOSIS — Z7189 Other specified counseling: Secondary | ICD-10-CM

## 2019-01-05 DIAGNOSIS — Z720 Tobacco use: Secondary | ICD-10-CM

## 2019-01-05 NOTE — Patient Instructions (Signed)
Medication Instructions:  Your physician recommends that you continue on your current medications as directed. Please refer to the Current Medication list given to you today.  If you need a refill on your cardiac medications before your next appointment, please call your pharmacy.   Lab work: Lipid and Liver when you are fasting sometime in the next two months.  If you have labs (blood work) drawn today and your tests are completely normal, you will receive your results only by: Marland Kitchen MyChart Message (if you have MyChart) OR . A paper copy in the mail If you have any lab test that is abnormal or we need to change your treatment, we will call you to review the results.  Testing/Procedures: None  Follow-Up: At [facility], you and your health needs are our priority.  As part of our continuing mission to provide you with exceptional heart care, we have created designated Provider Care Teams.  These Care Teams include your primary Cardiologist (physician) and Advanced Practice Providers (APPs -  Physician Assistants and Nurse Practitioners) who all work together to provide you with the care you need, when you need it. You will need a follow up appointment in 6 months.  Please call our office 2 months in advance to schedule this appointment.  You may see [name], MD or one of the following Advanced Practice Providers on your designated Care Team:   Truitt Merle, NP Cecilie Kicks, NP . Kathyrn Drown, NP  Any Other Special Instructions Will Be Listed Below (If Applicable).

## 2019-01-07 ENCOUNTER — Encounter (HOSPITAL_COMMUNITY): Payer: 59

## 2019-01-07 ENCOUNTER — Telehealth (HOSPITAL_COMMUNITY): Payer: Self-pay

## 2019-01-07 NOTE — Telephone Encounter (Signed)
Successful telephone encounter to [name] to follow up on incoming phone call c/o NVD and being tested by her employer for COVID-19. Casey Mason states symptoms started 01/06/2019 and worsened this am. She contacted her employer and per their protocol, was tested for COVID-19 today and told results would be available within 24 hours. She will self quarantine until. She will contact CR Monday with results and further instructions related to CR attendance. Ary Rudnick E. Rollene Rotunda, RN, BSN

## 2019-01-10 ENCOUNTER — Other Ambulatory Visit: Payer: Self-pay

## 2019-01-10 ENCOUNTER — Encounter (HOSPITAL_COMMUNITY)
Admission: RE | Admit: 2019-01-10 | Discharge: 2019-01-10 | Disposition: A | Discharge status: Home / Self Care | Payer: 59 | Source: Ambulatory Visit | Attending: Interventional Cardiology | Admitting: Interventional Cardiology

## 2019-01-10 DIAGNOSIS — I2102 ST elevation (STEMI) myocardial infarction involving left anterior descending coronary artery: Secondary | ICD-10-CM

## 2019-01-10 DIAGNOSIS — Z955 Presence of coronary angioplasty implant and graft: Secondary | ICD-10-CM

## 2019-01-10 LAB — GLUCOSE, CAPILLARY: Glucose-Capillary: 149 mg/dL — ABNORMAL HIGH (ref 70–99)

## 2019-01-11 NOTE — Progress Notes (Signed)
Reviewed home exercise guidelines with patient including endpoints, temperature precautions, target heart rate and rate of perceived exertion. Pt is walking as her mode of home exercise. Pt voices understanding of instructions given. Aragon Scarantino M Taiylor Virden, MS, ACSM CEP   

## 2019-01-12 ENCOUNTER — Encounter (HOSPITAL_COMMUNITY)
Admission: RE | Admit: 2019-01-12 | Discharge: 2019-01-12 | Disposition: A | Discharge status: Home / Self Care | Payer: 59 | Source: Ambulatory Visit | Attending: Interventional Cardiology | Admitting: Interventional Cardiology

## 2019-01-12 ENCOUNTER — Other Ambulatory Visit: Payer: Self-pay

## 2019-01-12 DIAGNOSIS — I2102 ST elevation (STEMI) myocardial infarction involving left anterior descending coronary artery: Secondary | ICD-10-CM | POA: Diagnosis not present

## 2019-01-12 DIAGNOSIS — Z955 Presence of coronary angioplasty implant and graft: Secondary | ICD-10-CM

## 2019-01-13 ENCOUNTER — Telehealth: Payer: Self-pay | Admitting: Interventional Cardiology

## 2019-01-13 NOTE — Telephone Encounter (Signed)
Signed FMLA form returned to [employer] at [facility] bldg. 01/13/19 vlm

## 2019-01-13 NOTE — Progress Notes (Signed)
Cardiac Individual Treatment Plan  Patient Details  Name: Casey Mason MRN: [id number] Date of Birth: November 17, 1965 Referring Provider:     [place] from 12/21/2018 in [place]  Referring Provider  Daneen Schick, MD      Initial Encounter Date:    CARDIAC REHAB PHASE II ORIENTATION from 12/21/2018 in [facility]  Date  12/21/18      Visit Diagnosis: 11/14/18 STEMI  11/14/18 DES LAD  Patient's Home Medications on Admission:  Current Outpatient Medications:  .  aspirin 81 MG tablet, Take 81 mg by mouth daily., Disp: , Rfl:  .  atorvastatin (LIPITOR) 80 MG tablet, Take 1 tablet (80 mg total) by mouth daily at 6 PM., Disp: 90 tablet, Rfl: 1 .  Cholecalciferol (VITAMIN D3) 2000 units TABS, Take 2 tablets by mouth daily. , Disp: , Rfl:  .  Cinnamon 500 MG capsule, Take 1,000 mg by mouth daily. , Disp: , Rfl:  .  Coenzyme Q10 100 MG capsule, Take 100 mg by mouth daily., Disp: , Rfl:  .  COLLAGEN PO, Take 1,000 mg by mouth 2 (two) times daily., Disp: , Rfl:  .  empagliflozin (JARDIANCE) 25 MG TABS tablet, Take 25 mg by mouth daily., Disp: , Rfl:  .  ibandronate (BONIVA) 150 MG tablet, Take 150 mg by mouth every 30 (thirty) days., Disp: , Rfl:  .  magnesium oxide (MAG-OX) 400 MG tablet, Take 400 mg by mouth daily., Disp: , Rfl:  .  metFORMIN (GLUCOPHAGE) 500 MG tablet, Take 1,000 mg by mouth 2 (two) times daily., Disp: , Rfl:  .  metoprolol tartrate (LOPRESSOR) 25 MG tablet, Take 0.5 tablets (12.5 mg total) by mouth 2 (two) times daily., Disp: 60 tablet, Rfl: 1 .  Multiple Vitamin (MULTIVITAMIN) tablet, Take 1 tablet by mouth daily., Disp: , Rfl:  .  nitroGLYCERIN (NITROSTAT) 0.4 MG SL tablet, Place 1 tablet (0.4 mg total) under the tongue every 5 (five) minutes x 3 doses as needed for chest pain., Disp: 25 tablet, Rfl: 2 .  pantoprazole (PROTONIX) 20 MG tablet, Take 20 mg by mouth daily., Disp: , Rfl:  .   ticagrelor (BRILINTA) 90 MG TABS tablet, Take 1 tablet (90 mg total) by mouth 2 (two) times daily., Disp: 180 tablet, Rfl: 2 .  vitamin B-12 (CYANOCOBALAMIN) 100 MCG tablet, Take 100 mcg by mouth daily., Disp: , Rfl:   Past Medical History: Past Medical History:  Diagnosis Date  . Diabetes mellitus without complication ([place])   . Diverticulosis of colon   . Family history of adverse reaction to anesthesia    mother-- ponv  . History of colon polyps    2016- hyperplastic  . History of diverticulitis of colon   . Nephrolithiasis    right  . PONV (postoperative nausea and vomiting)   . Prolapse of female bladder, acquired   . Right ureteral stone     Tobacco Use: Social History   Tobacco Use  Smoking Status Former Smoker  . Packs/day: 0.50  . Years: 15.00  . Pack years: 7.50  . Types: Cigarettes  . Quit date: 11/14/2018  . Years since quitting: 0.1  Smokeless Tobacco Never Used    Labs: Recent Review Scientist, physiological    Labs for ITP Cardiac and Pulmonary Rehab Latest Ref Rng & Units 11/15/2018   Cholestrol 0 - 200 mg/dL 138   LDLCALC 0 - 99 mg/dL 79   HDL >40 mg/dL  41   Trlycerides <150 mg/dL 89   Hemoglobin A1c 4.8 - 5.6 % 6.0(H)      Capillary Blood Glucose: Lab Results  Component Value Date   GLUCAP 149 (H) 01/10/2019   GLUCAP 98 01/03/2019   GLUCAP 114 (H) 01/03/2019   GLUCAP 121 (H) 12/27/2018   GLUCAP 145 (H) 12/27/2018     Exercise Target Goals: Exercise Program Goal: Individual exercise prescription set using results from initial 6 min walk test and THRR while considering  patient's activity barriers and safety.   Exercise Prescription Goal: Starting with aerobic activity 30 plus minutes a day, 3 days per week for initial exercise prescription. Provide home exercise prescription and guidelines that participant acknowledges understanding prior to discharge.  Activity Barriers & Risk Stratification: Activity Barriers & Cardiac Risk Stratification -  12/21/18 0843      Activity Barriers & Cardiac Risk Stratification   Activity Barriers  Other (comment)    Comments  History of right rotator cuff surgery, arthritis in top joint of left index finger, history of vertigo.    Cardiac Risk Stratification  High       6 Minute Walk: 6 Minute Walk    Row Name 12/21/18 0757         6 Minute Walk   Phase  Initial     Distance  1641 feet     Walk Time  6 minutes     # of Rest Breaks  0     MPH  3.11     METS  4.28     RPE  11     Perceived Dyspnea   0     VO2 Peak  14.99     Symptoms  No     Resting HR  63 bpm     Resting BP  98/60     Resting Oxygen Saturation   96 %     Exercise Oxygen Saturation  during 6 min walk  99 %     Max Ex. HR  92 bpm     Max Ex. BP  104/70     2 Minute Post BP  100/66        Oxygen Initial Assessment:   Oxygen Re-Evaluation:   Oxygen Discharge (Final Oxygen Re-Evaluation):   Initial Exercise Prescription: Initial Exercise Prescription - 12/21/18 0900      Date of Initial Exercise RX and Referring Provider   Date  12/21/18    Referring Provider  Daneen Schick, MD    Expected Discharge Date  02/18/19      Treadmill   MPH  3    Grade  0    Minutes  15    METs  3.3      NuStep   Level  3    SPM  85    Minutes  15    METs  3      Prescription Details   Frequency (times per week)  3    Duration  Progress to 30 minutes of continuous aerobic without signs/symptoms of physical distress      Intensity   THRR 40-80% of Max Heartrate  66-133    Ratings of Perceived Exertion  11-13    Perceived Dyspnea  0-4      Progression   Progression  Continue to progress workloads to maintain intensity without signs/symptoms of physical distress.      Resistance Training   Training Prescription  Yes    Weight  3lbs  Reps  10-15       Perform Capillary Blood Glucose checks as needed.  Exercise Prescription Changes: Exercise Prescription Changes    Row Name 12/27/18 1517 01/10/19 1522            Response to Exercise   Blood Pressure (Admit)  108/60  98/60      Blood Pressure (Exercise)  122/64  102/72      Blood Pressure (Exit)  104/70  98/64      Heart Rate (Admit)  80 bpm  83 bpm      Heart Rate (Exercise)  108 bpm  112 bpm      Heart Rate (Exit)  85 bpm  81 bpm      Rating of Perceived Exertion (Exercise)  12  13      Symptoms  none  none      Comments  Patient tolerated 1st exercise session well without symptoms.  -      Duration  Progress to 30 minutes of  aerobic without signs/symptoms of physical distress  Progress to 30 minutes of  aerobic without signs/symptoms of physical distress      Intensity  THRR unchanged  THRR unchanged        Progression   Progression  Continue to progress workloads to maintain intensity without signs/symptoms of physical distress.  Continue to progress workloads to maintain intensity without signs/symptoms of physical distress.      Average METs  3.1  3.2        Resistance Training   Training Prescription  Yes  Yes      Weight  3lbs  3lbs      Reps  10-15  10-15      Time  10 Minutes  10 Minutes        Interval Training   Interval Training  No  No        Treadmill   MPH  2.5  2.5      Grade  0  0      Minutes  15  15      METs  2.91  2.91        NuStep   Level  3  3      SPM  85  85      Minutes  15  15      METs  3.2  3.4        Home Exercise Plan   Plans to continue exercise at  -  Home (comment) Walking      Frequency  -  Add 4 additional days to program exercise sessions.      Initial Home Exercises Provided  -  01/10/19         Exercise Comments: Exercise Comments    Row Name 12/27/18 1608 01/10/19 1547         Exercise Comments  Patient completed 1st session of exercise and tolerated exercise well without symptoms.  Reviewed home exercise guidelines, METs, and goals with patient.         Exercise Goals and Review: Exercise Goals    Row Name 12/21/18 0747             Exercise Goals    Increase Physical Activity  Yes       Intervention  Provide advice, education, support and counseling about physical activity/exercise needs.;Develop an individualized exercise prescription for aerobic and resistive training based on initial evaluation findings, risk stratification, comorbidities and participant's personal goals.  Expected Outcomes  Short Term: Attend rehab on a regular basis to increase amount of physical activity.;Long Term: Exercising regularly at least 3-5 days a week.;Long Term: Add in home exercise to make exercise part of routine and to increase amount of physical activity.       Increase Strength and Stamina  Yes       Intervention  Provide advice, education, support and counseling about physical activity/exercise needs.;Develop an individualized exercise prescription for aerobic and resistive training based on initial evaluation findings, risk stratification, comorbidities and participant's personal goals.       Expected Outcomes  Short Term: Increase workloads from initial exercise prescription for resistance, speed, and METs.;Short Term: Perform resistance training exercises routinely during rehab and add in resistance training at home;Long Term: Improve cardiorespiratory fitness, muscular endurance and strength as measured by increased METs and functional capacity (6MWT)       Able to understand and use rate of perceived exertion (RPE) scale  Yes       Intervention  Provide education and explanation on how to use RPE scale       Expected Outcomes  Short Term: Able to use RPE daily in rehab to express subjective intensity level;Long Term:  Able to use RPE to guide intensity level when exercising independently       Knowledge and understanding of Target Heart Rate Range (THRR)  Yes       Intervention  Provide education and explanation of THRR including how the numbers were predicted and where they are located for reference       Expected Outcomes  Short Term: Able to  state/look up THRR;Long Term: Able to use THRR to govern intensity when exercising independently;Short Term: Able to use daily as guideline for intensity in rehab       Able to check pulse independently  Yes       Intervention  Provide education and demonstration on how to check pulse in carotid and radial arteries.;Review the importance of being able to check your own pulse for safety during independent exercise       Expected Outcomes  Short Term: Able to explain why pulse checking is important during independent exercise;Long Term: Able to check pulse independently and accurately       Understanding of Exercise Prescription  Yes       Intervention  Provide education, explanation, and written materials on patient's individual exercise prescription       Expected Outcomes  Short Term: Able to explain program exercise prescription;Long Term: Able to explain home exercise prescription to exercise independently          Exercise Goals Re-Evaluation : Exercise Goals Re-Evaluation    Row Name 12/27/18 1608 01/10/19 1547           Exercise Goal Re-Evaluation   Exercise Goals Review  Able to understand and use rate of perceived exertion (RPE) scale;Increase Physical Activity  Able to understand and use rate of perceived exertion (RPE) scale;Increase Physical Activity;Knowledge and understanding of Target Heart Rate Range (THRR);Understanding of Exercise Prescription      Comments  Patient able to understand and use RPE scale appropriately. Decreased warm-up and exercise speed on treadmill to allow patient to acclimate to using the treadmill. Tolerated well with decreased speed.  Reviewed home exercise guidelines with patient including THRR, RPE scale, and endpoints for exercise. Pt is walking 15-20 minutes, at least 5 days/week. Pt also, has 3lbs hand weights at home that she's using.  Expected Outcomes  Progress workloads as tolerated to help improve cardiorespiratory fitness.  Patient will  continue home exercise routine in addition to exercise at cardiac rehab to help achieve personal health and fitness goals.          Discharge Exercise Prescription (Final Exercise Prescription Changes): Exercise Prescription Changes - 01/10/19 1522      Response to Exercise   Blood Pressure (Admit)  98/60    Blood Pressure (Exercise)  102/72    Blood Pressure (Exit)  98/64    Heart Rate (Admit)  83 bpm    Heart Rate (Exercise)  112 bpm    Heart Rate (Exit)  81 bpm    Rating of Perceived Exertion (Exercise)  13    Symptoms  none    Duration  Progress to 30 minutes of  aerobic without signs/symptoms of physical distress    Intensity  THRR unchanged      Progression   Progression  Continue to progress workloads to maintain intensity without signs/symptoms of physical distress.    Average METs  3.2      Resistance Training   Training Prescription  Yes    Weight  3lbs    Reps  10-15    Time  10 Minutes      Interval Training   Interval Training  No      Treadmill   MPH  2.5    Grade  0    Minutes  15    METs  2.91      NuStep   Level  3    SPM  85    Minutes  15    METs  3.4      Home Exercise Plan   Plans to continue exercise at  Home (comment)   Walking   Frequency  Add 4 additional days to program exercise sessions.    Initial Home Exercises Provided  01/10/19       Nutrition:  Target Goals: Understanding of nutrition guidelines, daily intake of sodium 1500mg , cholesterol 200mg , calories 30% from fat and 7% or less from saturated fats, daily to have 5 or more servings of fruits and vegetables.  Biometrics: Pre Biometrics - 12/21/18 0747      Pre Biometrics   Height  5\' 5"  (1.651 m)    Weight  64.3 kg    Waist Circumference  31.75 inches    Hip Circumference  38 inches    Waist to Hip Ratio  0.84 %    BMI (Calculated)  23.59    Triceps Skinfold  16 mm    % Body Fat  31.6 %    Grip Strength  35.5 kg    Flexibility  13 in    Single Leg Stand  30  seconds        Nutrition Therapy Plan and Nutrition Goals:   Nutrition Assessments:   Nutrition Goals Re-Evaluation:   Nutrition Goals Discharge (Final Nutrition Goals Re-Evaluation):   Psychosocial: Target Goals: Acknowledge presence or absence of significant depression and/or stress, maximize coping skills, provide positive support system. Participant is able to verbalize types and ability to use techniques and skills needed for reducing stress and depression.  Initial Review & Psychosocial Screening: Initial Psych Review & Screening - 12/21/18 0904      Initial Review   Current issues with  Current Stress Concerns    Source of Stress Concerns  Chronic Illness;Occupation    Comments  Ms. Lalonde admits to life stressors including her  adjustment to having a diagnosis of MI/CAD and work. She feels these stressors are managable with family support and will not be a barrier to her participation in CR. She will continue to maintain a positive attitude and utilize family, friends, and healthcare team for support.      Family Dynamics   Good Support System?  Yes      Barriers   Psychosocial barriers to participate in program  The patient should benefit from training in stress management and relaxation.;Psychosocial barriers identified (see note)      Screening Interventions   Interventions  Encouraged to exercise;To provide support and resources with identified psychosocial needs;Provide feedback about the scores to participant    Expected Outcomes  Short Term goal: Utilizing psychosocial counselor, staff and physician to assist with identification of specific Stressors or current issues interfering with healing process. Setting desired goal for each stressor or current issue identified.;Long Term Goal: Stressors or current issues are controlled or eliminated.;Short Term goal: Identification and review with participant of any Quality of Life or Depression concerns found by scoring the  questionnaire.;Long Term goal: The participant improves quality of Life and PHQ9 Scores as seen by post scores and/or verbalization of changes       Quality of Life Scores: Quality of Life - 12/21/18 0931      Quality of Life   Select  Quality of Life      Quality of Life Scores   Health/Function Pre  17.27 %    Socioeconomic Pre  20.71 %    Psych/Spiritual Pre  18.5 %    Family Pre  22.7 %    GLOBAL Pre  19.03 %      Scores of 19 and below usually indicate a poorer quality of life in these areas.  A difference of  2-3 points is a clinically meaningful difference.  A difference of 2-3 points in the total score of the Quality of Life Index has been associated with significant improvement in overall quality of life, self-image, physical symptoms, and general health in studies assessing change in quality of life.  PHQ-9: Recent Review Flowsheet Data    Depression screen [facility] 2/9 12/21/2018 03/12/2018   Decreased Interest 0 0   Down, Depressed, Hopeless 0 0   PHQ - 2 Score 0 0     Interpretation of Total Score  Total Score Depression Severity:  1-4 = Minimal depression, 5-9 = Mild depression, 10-14 = Moderate depression, 15-19 = Moderately severe depression, 20-27 = Severe depression   Psychosocial Evaluation and Intervention: Psychosocial Evaluation - 12/28/18 0656      Psychosocial Evaluation & Interventions   Interventions  --       Psychosocial Re-Evaluation: Psychosocial Re-Evaluation    [place] Name 01/12/19 1037             Psychosocial Re-Evaluation   Current issues with  Current Stress Concerns       Comments  Patient continue to stress about her CAD risk factors and the possibility of another cardiac event. She is doing all she can to modify those risk factors and now in the acceptance phase of her grief process. She grieves the loss of her "health" however acknolodges a strong family support system. She states her anxiety and stress levels over her health is  beginning to lessen as she learns more about risk modification. She is maintaining a positive attitude and outlook.       Expected Outcomes  Patient wil continue to maintain a  positive outlook. She will lean on family and friends for emotional support and reassurance. She will continue to enjoy her hobbies and utilize her health care team for additional psychosocial support if needs arise.       Interventions  Encouraged to attend Cardiac Rehabilitation for the exercise       Continue Psychosocial Services   Follow up required by staff          Psychosocial Discharge (Final Psychosocial Re-Evaluation): Psychosocial Re-Evaluation - 01/12/19 1037      Psychosocial Re-Evaluation   Current issues with  Current Stress Concerns    Comments  Patient continue to stress about her CAD risk factors and the possibility of another cardiac event. She is doing all she can to modify those risk factors and now in the acceptance phase of her grief process. She grieves the loss of her "health" however acknolodges a strong family support system. She states her anxiety and stress levels over her health is beginning to lessen as she learns more about risk modification. She is maintaining a positive attitude and outlook.    Expected Outcomes  Patient wil continue to maintain a positive outlook. She will lean on family and friends for emotional support and reassurance. She will continue to enjoy her hobbies and utilize her health care team for additional psychosocial support if needs arise.    Interventions  Encouraged to attend Cardiac Rehabilitation for the exercise    Continue Psychosocial Services   Follow up required by staff       Vocational Rehabilitation: Provide vocational rehab assistance to qualifying candidates.   Vocational Rehab Evaluation & Intervention: Vocational Rehab - 12/21/18 [id number]      Initial Vocational Rehab Evaluation & Intervention   Assessment shows need for Vocational Rehabilitation  No        Education: Education Goals: Education classes will be provided on a weekly basis, covering required topics. Participant will state understanding/return demonstration of topics presented.  Learning Barriers/Preferences:   Education Topics: Hypertension, Hypertension Reduction -Define heart disease and high blood pressure. Discus how high blood pressure affects the body and ways to reduce high blood pressure.   Exercise and Your Heart -Discuss why it is important to exercise, the FITT principles of exercise, normal and abnormal responses to exercise, and how to exercise safely.   Angina -Discuss definition of angina, causes of angina, treatment of angina, and how to decrease risk of having angina.   Cardiac Medications -Review what the following cardiac medications are used for, how they affect the body, and side effects that may occur when taking the medications.  Medications include Aspirin, Beta blockers, calcium channel blockers, ACE Inhibitors, angiotensin receptor blockers, diuretics, digoxin, and antihyperlipidemics.   Congestive Heart Failure -Discuss the definition of CHF, how to live with CHF, the signs and symptoms of CHF, and how keep track of weight and sodium intake.   Heart Disease and Intimacy -Discus the effect sexual activity has on the heart, how changes occur during intimacy as we age, and safety during sexual activity.   Smoking Cessation / COPD -Discuss different methods to quit smoking, the health benefits of quitting smoking, and the definition of COPD.   Nutrition I: Fats -Discuss the types of cholesterol, what cholesterol does to the heart, and how cholesterol levels can be controlled.   Nutrition II: Labels -Discuss the different components of food labels and how to read food label   Heart Parts/Heart Disease and PAD -Discuss the anatomy of the heart, the  pathway of blood circulation through the heart, and these are affected by heart  disease.   Stress I: Signs and Symptoms -Discuss the causes of stress, how stress may lead to anxiety and depression, and ways to limit stress.   Stress II: Relaxation -Discuss different types of relaxation techniques to limit stress.   Warning Signs of Stroke / TIA -Discuss definition of a stroke, what the signs and symptoms are of a stroke, and how to identify when someone is having stroke.   Knowledge Questionnaire Score: Knowledge Questionnaire Score - 12/21/18 0931      Knowledge Questionnaire Score   Pre Score  25/28       Core Components/Risk Factors/Patient Goals at Admission: Personal Goals and Risk Factors at Admission - 12/21/18 0854      Core Components/Risk Factors/Patient Goals on Admission   Tobacco Cessation  Yes    Number of packs per day  0.5    Intervention  Offer self-teaching materials, assist with locating and accessing local/national Quit Smoking programs, and support quit date choice.;Assist the participant in steps to quit. Provide individualized education and counseling about committing to Tobacco Cessation, relapse prevention, and pharmacological support that can be provided by physician.    Expected Outcomes  Short Term: Will demonstrate readiness to quit, by selecting a quit date.;Long Term: Complete abstinence from all tobacco products for at least 12 months from quit date.;Short Term: Will quit all tobacco product use, adhering to prevention of relapse plan.    Diabetes  Yes    Intervention  Provide education about signs/symptoms and action to take for hypo/hyperglycemia.;Provide education about proper nutrition, including hydration, and aerobic/resistive exercise prescription along with prescribed medications to achieve blood glucose in normal ranges: Fasting glucose 65-99 mg/dL    Expected Outcomes  Short Term: Participant verbalizes understanding of the signs/symptoms and immediate care of hyper/hypoglycemia, proper foot care and importance of  medication, aerobic/resistive exercise and nutrition plan for blood glucose control.;Long Term: Attainment of HbA1C < 7%.    Stress  Yes    Intervention  Offer individual and/or small group education and counseling on adjustment to heart disease, stress management and health-related lifestyle change. Teach and support self-help strategies.;Refer participants experiencing significant psychosocial distress to appropriate mental health specialists for further evaluation and treatment. When possible, include family members and significant others in education/counseling sessions.    Expected Outcomes  Short Term: Participant demonstrates changes in health-related behavior, relaxation and other stress management skills, ability to obtain effective social support, and compliance with psychotropic medications if prescribed.;Long Term: Emotional wellbeing is indicated by absence of clinically significant psychosocial distress or social isolation.       Core Components/Risk Factors/Patient Goals Review:  Goals and Risk Factor Review    Row Name 12/28/18 0706 01/12/19 1646           Core Components/Risk Factors/Patient Goals Review   Personal Goals Review  Tobacco Cessation;Diabetes;Stress  Tobacco Cessation;Diabetes;Stress      Review  Patient with multiple CAD risk factors. She is extremely eager to participate in CR to improve outcomes.  Patient with multiple CAD risk factors. She continues to be eager to participate in CR to improve outcomes. Her diabetes is controlled with blood sugars within parameters for exercise. She continues smoking cessasion and is managing her stress in a healthy way by spending time with family.      Expected Outcomes  Patient will continue to participate in CR to modify her risk factors. Her goals are to establish  an exercise routein and decrease her risk for another cardiac event.  Patient will continue to participate in CR to modify her risk factors. Her goals are to establish  an exercise routein and decrease her risk for another cardiac event.         Core Components/Risk Factors/Patient Goals at Discharge (Final Review):  Goals and Risk Factor Review - 01/12/19 1646      Core Components/Risk Factors/Patient Goals Review   Personal Goals Review  Tobacco Cessation;Diabetes;Stress    Review  Patient with multiple CAD risk factors. She continues to be eager to participate in CR to improve outcomes. Her diabetes is controlled with blood sugars within parameters for exercise. She continues smoking cessasion and is managing her stress in a healthy way by spending time with family.    Expected Outcomes  Patient will continue to participate in CR to modify her risk factors. Her goals are to establish an exercise routein and decrease her risk for another cardiac event.       ITP Comments: ITP Comments    Row Name 12/21/18 [phone] 12/28/18 0656 01/12/19 1032       ITP Comments  Medical Director- Dr. Fransico Him, MD  Patient started CR exercise today and tolerated well.  30 day ITP review: Pam continues to do well in CR. She is tolerating workload increases and she seems to have more self assurance about managing her CAD risk factors including DM. VSS. Patient denies complaints or cardiac symptoms.        Comments: See ITP comments

## 2019-01-14 ENCOUNTER — Encounter (HOSPITAL_COMMUNITY)
Admission: RE | Admit: 2019-01-14 | Discharge: 2019-01-14 | Disposition: A | Discharge status: Home / Self Care | Payer: 59 | Source: Ambulatory Visit | Attending: Interventional Cardiology | Admitting: Interventional Cardiology

## 2019-01-14 ENCOUNTER — Other Ambulatory Visit: Payer: Self-pay

## 2019-01-14 DIAGNOSIS — I2102 ST elevation (STEMI) myocardial infarction involving left anterior descending coronary artery: Secondary | ICD-10-CM

## 2019-01-14 DIAGNOSIS — Z955 Presence of coronary angioplasty implant and graft: Secondary | ICD-10-CM

## 2019-01-17 ENCOUNTER — Other Ambulatory Visit: Payer: 59 | Admitting: *Deleted

## 2019-01-17 ENCOUNTER — Encounter (HOSPITAL_COMMUNITY)
Admission: RE | Admit: 2019-01-17 | Discharge: 2019-01-17 | Disposition: A | Discharge status: Home / Self Care | Payer: 59 | Source: Ambulatory Visit | Attending: Interventional Cardiology | Admitting: Interventional Cardiology

## 2019-01-17 ENCOUNTER — Other Ambulatory Visit: Payer: Self-pay

## 2019-01-17 DIAGNOSIS — Z955 Presence of coronary angioplasty implant and graft: Secondary | ICD-10-CM

## 2019-01-17 DIAGNOSIS — I2102 ST elevation (STEMI) myocardial infarction involving left anterior descending coronary artery: Secondary | ICD-10-CM

## 2019-01-17 DIAGNOSIS — I10 Essential (primary) hypertension: Secondary | ICD-10-CM

## 2019-01-19 ENCOUNTER — Encounter (HOSPITAL_COMMUNITY)
Admission: RE | Admit: 2019-01-19 | Discharge: 2019-01-19 | Disposition: A | Discharge status: Home / Self Care | Payer: 59 | Source: Ambulatory Visit | Attending: Interventional Cardiology | Admitting: Interventional Cardiology

## 2019-01-19 ENCOUNTER — Other Ambulatory Visit: Payer: Self-pay

## 2019-01-19 DIAGNOSIS — Z955 Presence of coronary angioplasty implant and graft: Secondary | ICD-10-CM

## 2019-01-19 DIAGNOSIS — I2102 ST elevation (STEMI) myocardial infarction involving left anterior descending coronary artery: Secondary | ICD-10-CM | POA: Diagnosis not present

## 2019-01-19 LAB — HEPATIC FUNCTION PANEL
ALT: 32 IU/L (ref 0–32)
AST: 25 IU/L (ref 0–40)
Albumin: 4.5 g/dL (ref 3.8–4.9)
Alkaline Phosphatase: 82 IU/L (ref 39–117)
Bilirubin Total: 0.3 mg/dL (ref 0.0–1.2)
Bilirubin, Direct: 0.12 mg/dL (ref 0.00–0.40)
Total Protein: 6.5 g/dL (ref 6.0–8.5)

## 2019-01-19 LAB — LIPID PANEL
Chol/HDL Ratio: 2.4 ratio (ref 0.0–4.4)
Cholesterol, Total: 79 mg/dL — ABNORMAL LOW (ref 100–199)
HDL: 33 mg/dL — ABNORMAL LOW (ref >39)
LDL Chol Calc (NIH): 33 mg/dL (ref 0–99)
Triglycerides: 53 mg/dL (ref 0–149)
VLDL Cholesterol Cal: 13 mg/dL (ref 5–40)

## 2019-01-19 LAB — GLUCOSE, CAPILLARY: Glucose-Capillary: 162 mg/dL — ABNORMAL HIGH (ref 70–99)

## 2019-01-19 NOTE — Progress Notes (Signed)
Nutrition Note Spoke with pt today about carbohydrate counting, and eating a consistent amount of carbohydrates across the day. Reviewed the benefits of carbohydrate counting and eating a consistent amount of carbohydrates across the day. Showed pt how to calculate carbohydrate servings, and distributed handouts for patient to practice. Recommended pt eat 3-4 servings of carbohydrates at meals and 1-2 servings with snacks. Discussed the importance of creating a balanced meal with the addition of protein and non-starchy vegetables. Distributed recipes and snack ideas to patient to try. Additionally discussed with patient that exercise may cause blood sugar to decrease and that we may need to add in a snack before or after workout, to manage any changes. Distributed handout of snack ideas that would provide a combination of carbohydrates and protein. Reviewed heart healthy diet in combination with modified carb diet. Pt verbalized understanding of material discussed today. Distributed RD contact information.     Michaele Offer, MS, RDN, LDN

## 2019-01-21 ENCOUNTER — Encounter (HOSPITAL_COMMUNITY)
Admission: RE | Admit: 2019-01-21 | Discharge: 2019-01-21 | Disposition: A | Discharge status: Home / Self Care | Payer: 59 | Source: Ambulatory Visit | Attending: Interventional Cardiology | Admitting: Interventional Cardiology

## 2019-01-21 ENCOUNTER — Other Ambulatory Visit: Payer: Self-pay

## 2019-01-21 DIAGNOSIS — I2102 ST elevation (STEMI) myocardial infarction involving left anterior descending coronary artery: Secondary | ICD-10-CM | POA: Diagnosis not present

## 2019-01-21 DIAGNOSIS — Z955 Presence of coronary angioplasty implant and graft: Secondary | ICD-10-CM

## 2019-01-24 ENCOUNTER — Telehealth: Payer: Self-pay | Admitting: *Deleted

## 2019-01-24 ENCOUNTER — Encounter (HOSPITAL_COMMUNITY): Payer: 59

## 2019-01-24 MED ORDER — ATORVASTATIN CALCIUM 40 MG PO TABS: 40.0000 mg | ORAL_TABLET | Freq: Every day | ORAL | 3 refills | Status: DC

## 2019-01-24 NOTE — Telephone Encounter (Signed)
Spoke with pt and went over results and recommendations per Dr. [name].  Pt verbalized understanding and was in agreement with plan.

## 2019-01-24 NOTE — Telephone Encounter (Signed)
-----   Message from Belva Crome, MD sent at 01/19/2019  4:09 PM EDT ----- Let the patient know lipids are better than needed. When current supply of Atorvastatin done, start Atorvastatin 40 mg daily. A copy will be sent to [name], MD

## 2019-01-25 ENCOUNTER — Encounter: Payer: Self-pay | Admitting: *Deleted

## 2019-01-26 ENCOUNTER — Other Ambulatory Visit: Payer: Self-pay

## 2019-01-26 ENCOUNTER — Encounter (HOSPITAL_COMMUNITY)
Admission: RE | Admit: 2019-01-26 | Discharge: 2019-01-26 | Disposition: A | Discharge status: Home / Self Care | Payer: 59 | Source: Ambulatory Visit | Attending: Interventional Cardiology | Admitting: Interventional Cardiology

## 2019-01-26 VITALS — BP 89/60 | HR 66 | Temp 97.8°F | Resp 18

## 2019-01-26 DIAGNOSIS — I2102 ST elevation (STEMI) myocardial infarction involving left anterior descending coronary artery: Secondary | ICD-10-CM

## 2019-01-26 DIAGNOSIS — Z955 Presence of coronary angioplasty implant and graft: Secondary | ICD-10-CM

## 2019-01-28 ENCOUNTER — Other Ambulatory Visit: Payer: Self-pay

## 2019-01-28 ENCOUNTER — Encounter (HOSPITAL_COMMUNITY)
Admission: RE | Admit: 2019-01-28 | Discharge: 2019-01-28 | Disposition: A | Discharge status: Home / Self Care | Payer: 59 | Source: Ambulatory Visit | Attending: Interventional Cardiology | Admitting: Interventional Cardiology

## 2019-01-28 DIAGNOSIS — I2102 ST elevation (STEMI) myocardial infarction involving left anterior descending coronary artery: Secondary | ICD-10-CM | POA: Diagnosis not present

## 2019-01-28 DIAGNOSIS — Z955 Presence of coronary angioplasty implant and graft: Secondary | ICD-10-CM

## 2019-01-31 ENCOUNTER — Encounter (HOSPITAL_COMMUNITY)
Admission: RE | Admit: 2019-01-31 | Discharge: 2019-01-31 | Disposition: A | Discharge status: Home / Self Care | Payer: 59 | Source: Ambulatory Visit | Attending: Interventional Cardiology | Admitting: Interventional Cardiology

## 2019-01-31 ENCOUNTER — Other Ambulatory Visit: Payer: Self-pay

## 2019-01-31 DIAGNOSIS — I2102 ST elevation (STEMI) myocardial infarction involving left anterior descending coronary artery: Secondary | ICD-10-CM

## 2019-01-31 DIAGNOSIS — Z955 Presence of coronary angioplasty implant and graft: Secondary | ICD-10-CM | POA: Insufficient documentation

## 2019-02-02 ENCOUNTER — Other Ambulatory Visit: Payer: Self-pay

## 2019-02-02 ENCOUNTER — Encounter (HOSPITAL_COMMUNITY)
Admission: RE | Admit: 2019-02-02 | Discharge: 2019-02-02 | Disposition: A | Discharge status: Home / Self Care | Payer: 59 | Source: Ambulatory Visit | Attending: Interventional Cardiology | Admitting: Interventional Cardiology

## 2019-02-02 VITALS — Ht 65.0 in | Wt 141.5 lb

## 2019-02-02 DIAGNOSIS — I2102 ST elevation (STEMI) myocardial infarction involving left anterior descending coronary artery: Secondary | ICD-10-CM

## 2019-02-02 DIAGNOSIS — Z955 Presence of coronary angioplasty implant and graft: Secondary | ICD-10-CM

## 2019-02-04 ENCOUNTER — Other Ambulatory Visit: Payer: Self-pay

## 2019-02-04 ENCOUNTER — Encounter (HOSPITAL_COMMUNITY)
Admission: RE | Admit: 2019-02-04 | Discharge: 2019-02-04 | Disposition: A | Discharge status: Home / Self Care | Payer: 59 | Source: Ambulatory Visit | Attending: Interventional Cardiology | Admitting: Interventional Cardiology

## 2019-02-04 DIAGNOSIS — I2102 ST elevation (STEMI) myocardial infarction involving left anterior descending coronary artery: Secondary | ICD-10-CM | POA: Diagnosis not present

## 2019-02-04 DIAGNOSIS — Z955 Presence of coronary angioplasty implant and graft: Secondary | ICD-10-CM

## 2019-02-07 ENCOUNTER — Encounter (HOSPITAL_COMMUNITY)
Admission: RE | Admit: 2019-02-07 | Discharge: 2019-02-07 | Disposition: A | Discharge status: Home / Self Care | Payer: 59 | Source: Ambulatory Visit | Attending: Interventional Cardiology | Admitting: Interventional Cardiology

## 2019-02-07 ENCOUNTER — Other Ambulatory Visit: Payer: Self-pay

## 2019-02-07 DIAGNOSIS — I2102 ST elevation (STEMI) myocardial infarction involving left anterior descending coronary artery: Secondary | ICD-10-CM

## 2019-02-07 DIAGNOSIS — Z955 Presence of coronary angioplasty implant and graft: Secondary | ICD-10-CM

## 2019-02-09 ENCOUNTER — Encounter (HOSPITAL_COMMUNITY)
Admission: RE | Admit: 2019-02-09 | Discharge: 2019-02-09 | Disposition: A | Discharge status: Home / Self Care | Payer: 59 | Source: Ambulatory Visit | Attending: Interventional Cardiology | Admitting: Interventional Cardiology

## 2019-02-09 ENCOUNTER — Other Ambulatory Visit: Payer: Self-pay

## 2019-02-09 DIAGNOSIS — I2102 ST elevation (STEMI) myocardial infarction involving left anterior descending coronary artery: Secondary | ICD-10-CM | POA: Diagnosis not present

## 2019-02-09 DIAGNOSIS — Z955 Presence of coronary angioplasty implant and graft: Secondary | ICD-10-CM

## 2019-02-10 NOTE — Progress Notes (Signed)
Cardiac Individual Treatment Plan  Patient Details  Name: Casey Mason MRN: [id number] Date of Birth: [birth year] Referring Provider:     [place] from 12/21/2018 in [place]  Referring Provider  Daneen Schick, MD      Initial Encounter Date:    CARDIAC REHAB PHASE II ORIENTATION from 12/21/2018 in [place]  Date  12/21/18      Visit Diagnosis: 11/14/18 DES LAD  11/14/18 STEMI  Patient's Home Medications on Admission:  Current Outpatient Medications:  .  aspirin 81 MG tablet, Take 81 mg by mouth daily., Disp: , Rfl:  .  atorvastatin (LIPITOR) 40 MG tablet, Take 1 tablet (40 mg total) by mouth daily., Disp: 90 tablet, Rfl: 3 .  Cholecalciferol (VITAMIN D3) 2000 units TABS, Take 2 tablets by mouth daily. , Disp: , Rfl:  .  Cinnamon 500 MG capsule, Take 1,000 mg by mouth daily. , Disp: , Rfl:  .  Coenzyme Q10 100 MG capsule, Take 100 mg by mouth daily., Disp: , Rfl:  .  COLLAGEN PO, Take 1,000 mg by mouth 2 (two) times daily., Disp: , Rfl:  .  empagliflozin (JARDIANCE) 25 MG TABS tablet, Take 25 mg by mouth daily., Disp: , Rfl:  .  ibandronate (BONIVA) 150 MG tablet, Take 150 mg by mouth every 30 (thirty) days., Disp: , Rfl:  .  magnesium oxide (MAG-OX) 400 MG tablet, Take 400 mg by mouth daily., Disp: , Rfl:  .  metFORMIN (GLUCOPHAGE) 500 MG tablet, Take 1,000 mg by mouth 2 (two) times daily., Disp: , Rfl:  .  metoprolol tartrate (LOPRESSOR) 25 MG tablet, Take 0.5 tablets (12.5 mg total) by mouth 2 (two) times daily., Disp: 60 tablet, Rfl: 1 .  Multiple Vitamin (MULTIVITAMIN) tablet, Take 1 tablet by mouth daily., Disp: , Rfl:  .  nitroGLYCERIN (NITROSTAT) 0.4 MG SL tablet, Place 1 tablet (0.4 mg total) under the tongue every 5 (five) minutes x 3 doses as needed for chest pain., Disp: 25 tablet, Rfl: 2 .  pantoprazole (PROTONIX) 20 MG tablet, Take 20 mg by mouth daily., Disp: , Rfl:  .   ticagrelor (BRILINTA) 90 MG TABS tablet, Take 1 tablet (90 mg total) by mouth 2 (two) times daily., Disp: 180 tablet, Rfl: 2 .  vitamin B-12 (CYANOCOBALAMIN) 100 MCG tablet, Take 100 mcg by mouth daily., Disp: , Rfl:   Past Medical History: Past Medical History:  Diagnosis Date  . Diabetes mellitus without complication ([place])   . Diverticulosis of colon   . Family history of adverse reaction to anesthesia    mother-- ponv  . History of colon polyps    2016- hyperplastic  . History of diverticulitis of colon   . Nephrolithiasis    right  . PONV (postoperative nausea and vomiting)   . Prolapse of female bladder, acquired   . Right ureteral stone     Tobacco Use: Social History   Tobacco Use  Smoking Status Former Smoker  . Packs/day: 0.50  . Years: 15.00  . Pack years: 7.50  . Types: Cigarettes  . Quit date: 11/14/2018  . Years since quitting: 0.2  Smokeless Tobacco Never Used    Labs: Recent Review Scientist, physiological    Labs for ITP Cardiac and Pulmonary Rehab Latest Ref Rng & Units 11/15/2018 01/17/2019   Cholestrol 100 - 199 mg/dL 138 79(L)   LDLCALC 0 - 99 mg/dL 79 33   HDL >39 mg/dL  41 33(L)   Trlycerides 0 - 149 mg/dL 89 53   Hemoglobin A1c 4.8 - 5.6 % 6.0(H) -      Capillary Blood Glucose: Lab Results  Component Value Date   GLUCAP 162 (H) 01/19/2019   GLUCAP 149 (H) 01/10/2019   GLUCAP 98 01/03/2019   GLUCAP 114 (H) 01/03/2019   GLUCAP 121 (H) 12/27/2018     Exercise Target Goals: Exercise Program Goal: Individual exercise prescription set using results from initial 6 min walk test and THRR while considering  patient's activity barriers and safety.   Exercise Prescription Goal: Starting with aerobic activity 30 plus minutes a day, 3 days per week for initial exercise prescription. Provide home exercise prescription and guidelines that participant acknowledges understanding prior to discharge.  Activity Barriers & Risk Stratification: Activity Barriers &  Cardiac Risk Stratification - 12/21/18 0843      Activity Barriers & Cardiac Risk Stratification   Activity Barriers  Other (comment)    Comments  History of right rotator cuff surgery, arthritis in top joint of left index finger, history of vertigo.    Cardiac Risk Stratification  High       6 Minute Walk: 6 Minute Walk    Row Name 12/21/18 0757         6 Minute Walk   Phase  Initial     Distance  1641 feet     Walk Time  6 minutes     # of Rest Breaks  0     MPH  3.11     METS  4.28     RPE  11     Perceived Dyspnea   0     VO2 Peak  14.99     Symptoms  No     Resting HR  63 bpm     Resting BP  98/60     Resting Oxygen Saturation   96 %     Exercise Oxygen Saturation  during 6 min walk  99 %     Max Ex. HR  92 bpm     Max Ex. BP  104/70     2 Minute Post BP  100/66        Oxygen Initial Assessment:   Oxygen Re-Evaluation:   Oxygen Discharge (Final Oxygen Re-Evaluation):   Initial Exercise Prescription: Initial Exercise Prescription - 12/21/18 0900      Date of Initial Exercise RX and Referring Provider   Date  12/21/18    Referring Provider  Daneen Schick, MD    Expected Discharge Date  02/18/19      Treadmill   MPH  3    Grade  0    Minutes  15    METs  3.3      NuStep   Level  3    SPM  85    Minutes  15    METs  3      Prescription Details   Frequency (times per week)  3    Duration  Progress to 30 minutes of continuous aerobic without signs/symptoms of physical distress      Intensity   THRR 40-80% of Max Heartrate  66-133    Ratings of Perceived Exertion  11-13    Perceived Dyspnea  0-4      Progression   Progression  Continue to progress workloads to maintain intensity without signs/symptoms of physical distress.      Resistance Training   Training Prescription  Yes  Weight  3lbs    Reps  10-15       Perform Capillary Blood Glucose checks as needed.  Exercise Prescription Changes: Exercise Prescription Changes    Row  Name 12/27/18 1517 01/10/19 1522 01/26/19 1517 02/07/19 1516       Response to Exercise   Blood Pressure (Admit)  108/60  98/60  104/60  98/68    Blood Pressure (Exercise)  122/64  102/72  122/72  112/66    Blood Pressure (Exit)  104/70  98/64  92/68 96/64 recheck BP  100/58    Heart Rate (Admit)  80 bpm  83 bpm  78 bpm  89 bpm    Heart Rate (Exercise)  108 bpm  112 bpm  107 bpm  117 bpm    Heart Rate (Exit)  85 bpm  81 bpm  82 bpm  83 bpm    Rating of Perceived Exertion (Exercise)  12  13  10  9     Symptoms  none  none  none  none    Comments  Patient tolerated 1st exercise session well without symptoms.  -  -  -    Duration  Progress to 30 minutes of  aerobic without signs/symptoms of physical distress  Progress to 30 minutes of  aerobic without signs/symptoms of physical distress  Progress to 30 minutes of  aerobic without signs/symptoms of physical distress  Progress to 30 minutes of  aerobic without signs/symptoms of physical distress    Intensity  THRR unchanged  THRR unchanged  THRR unchanged  THRR unchanged      Progression   Progression  Continue to progress workloads to maintain intensity without signs/symptoms of physical distress.  Continue to progress workloads to maintain intensity without signs/symptoms of physical distress.  Continue to progress workloads to maintain intensity without signs/symptoms of physical distress.  Continue to progress workloads to maintain intensity without signs/symptoms of physical distress.    Average METs  3.1  3.2  4  4       Resistance Training   Training Prescription  Yes  Yes  No Relaxation day, no weights.  Yes    Weight  3lbs  3lbs  -  3lbs    Reps  10-15  10-15  -  10-15    Time  10 Minutes  10 Minutes  -  10 Minutes      Interval Training   Interval Training  No  No  No  No      Treadmill   MPH  2.5  2.5  2.6  2.7    Grade  0  0  2  2    Minutes  15  15  15  15     METs  2.91  2.91  3.71  3.81      NuStep   Level  3  3  4  5      SPM  85  85  85  85    Minutes  15  15  15  15     METs  3.2  3.4  4.3  4.1      Home Exercise Plan   Plans to continue exercise at  -  Home (comment) Walking  Home (comment) Walking  Home (comment) Walking    Frequency  -  Add 4 additional days to program exercise sessions.  Add 4 additional days to program exercise sessions.  Add 4 additional days to program exercise sessions.    Initial Home Exercises Provided  -  01/10/19  01/10/19  01/10/19       Exercise Comments: Exercise Comments    Row Name 12/27/18 1608 01/10/19 1547 01/26/19 1550 02/09/19 1530     Exercise Comments  Patient completed 1st session of exercise and tolerated exercise well without symptoms.  Reviewed home exercise guidelines, METs, and goals with patient.  Reviewed METs  with patient.  Reviewed METs and goals with patient.       Exercise Goals and Review: Exercise Goals    Row Name 12/21/18 0747             Exercise Goals   Increase Physical Activity  Yes       Intervention  Provide advice, education, support and counseling about physical activity/exercise needs.;Develop an individualized exercise prescription for aerobic and resistive training based on initial evaluation findings, risk stratification, comorbidities and participant's personal goals.       Expected Outcomes  Short Term: Attend rehab on a regular basis to increase amount of physical activity.;Long Term: Exercising regularly at least 3-5 days a week.;Long Term: Add in home exercise to make exercise part of routine and to increase amount of physical activity.       Increase Strength and Stamina  Yes       Intervention  Provide advice, education, support and counseling about physical activity/exercise needs.;Develop an individualized exercise prescription for aerobic and resistive training based on initial evaluation findings, risk stratification, comorbidities and participant's personal goals.       Expected Outcomes  Short Term: Increase workloads  from initial exercise prescription for resistance, speed, and METs.;Short Term: Perform resistance training exercises routinely during rehab and add in resistance training at home;Long Term: Improve cardiorespiratory fitness, muscular endurance and strength as measured by increased METs and functional capacity (6MWT)       Able to understand and use rate of perceived exertion (RPE) scale  Yes       Intervention  Provide education and explanation on how to use RPE scale       Expected Outcomes  Short Term: Able to use RPE daily in rehab to express subjective intensity level;Long Term:  Able to use RPE to guide intensity level when exercising independently       Knowledge and understanding of Target Heart Rate Range (THRR)  Yes       Intervention  Provide education and explanation of THRR including how the numbers were predicted and where they are located for reference       Expected Outcomes  Short Term: Able to state/look up THRR;Long Term: Able to use THRR to govern intensity when exercising independently;Short Term: Able to use daily as guideline for intensity in rehab       Able to check pulse independently  Yes       Intervention  Provide education and demonstration on how to check pulse in carotid and radial arteries.;Review the importance of being able to check your own pulse for safety during independent exercise       Expected Outcomes  Short Term: Able to explain why pulse checking is important during independent exercise;Long Term: Able to check pulse independently and accurately       Understanding of Exercise Prescription  Yes       Intervention  Provide education, explanation, and written materials on patient's individual exercise prescription       Expected Outcomes  Short Term: Able to explain program exercise prescription;Long Term: Able to explain home exercise prescription to exercise independently  Exercise Goals Re-Evaluation : Exercise Goals Re-Evaluation    Row Name  12/27/18 1608 01/10/19 1547 02/09/19 1530         Exercise Goal Re-Evaluation   Exercise Goals Review  Able to understand and use rate of perceived exertion (RPE) scale;Increase Physical Activity  Able to understand and use rate of perceived exertion (RPE) scale;Increase Physical Activity;Knowledge and understanding of Target Heart Rate Range (THRR);Understanding of Exercise Prescription  Able to understand and use rate of perceived exertion (RPE) scale;Increase Physical Activity;Knowledge and understanding of Target Heart Rate Range (THRR);Understanding of Exercise Prescription     Comments  Patient able to understand and use RPE scale appropriately. Decreased warm-up and exercise speed on treadmill to allow patient to acclimate to using the treadmill. Tolerated well with decreased speed.  Reviewed home exercise guidelines with patient including THRR, RPE scale, and endpoints for exercise. Pt is walking 15-20 minutes, at least 5 days/week. Pt also, has 3lbs hand weights at home that she's using.  Patient is progressing well with exercise, increasing workloads appropriately. Pt is walking or riding her stationary bike as her mode of home exercise.     Expected Outcomes  Progress workloads as tolerated to help improve cardiorespiratory fitness.  Patient will continue home exercise routine in addition to exercise at cardiac rehab to help achieve personal health and fitness goals.  Patient will continue exercise: walking and/or riding stationary bike as her mode of exercise upon completion of the cardiac rehab program.         Discharge Exercise Prescription (Final Exercise Prescription Changes): Exercise Prescription Changes - 02/07/19 1516      Response to Exercise   Blood Pressure (Admit)  98/68    Blood Pressure (Exercise)  112/66    Blood Pressure (Exit)  100/58    Heart Rate (Admit)  89 bpm    Heart Rate (Exercise)  117 bpm    Heart Rate (Exit)  83 bpm    Rating of Perceived Exertion  (Exercise)  9    Symptoms  none    Duration  Progress to 30 minutes of  aerobic without signs/symptoms of physical distress    Intensity  THRR unchanged      Progression   Progression  Continue to progress workloads to maintain intensity without signs/symptoms of physical distress.    Average METs  4      Resistance Training   Training Prescription  Yes    Weight  3lbs    Reps  10-15    Time  10 Minutes      Interval Training   Interval Training  No      Treadmill   MPH  2.7    Grade  2    Minutes  15    METs  3.81      NuStep   Level  5    SPM  85    Minutes  15    METs  4.1      Home Exercise Plan   Plans to continue exercise at  Home (comment)   Walking   Frequency  Add 4 additional days to program exercise sessions.    Initial Home Exercises Provided  01/10/19       Nutrition:  Target Goals: Understanding of nutrition guidelines, daily intake of sodium 1500mg , cholesterol 200mg , calories 30% from fat and 7% or less from saturated fats, daily to have 5 or more servings of fruits and vegetables.  Biometrics: Pre Biometrics - 12/21/18 [id number]  Pre Biometrics   Height  5\' 5"  (1.651 m)    Weight  64.3 kg    Waist Circumference  31.75 inches    Hip Circumference  38 inches    Waist to Hip Ratio  0.84 %    BMI (Calculated)  23.59    Triceps Skinfold  16 mm    % Body Fat  31.6 %    Grip Strength  35.5 kg    Flexibility  13 in    Single Leg Stand  30 seconds        Nutrition Therapy Plan and Nutrition Goals: Nutrition Therapy & Goals - 02/08/19 1135      Nutrition Therapy   Diet  Carb modified/heart healthy    Drug/Food Interactions  Statins/Certain Fruits      Personal Nutrition Goals   Nutrition Goal  Increase fiber rich carbohydrates to meet fiber recommendation of 25-30 grams/day    Personal Goal #2  Maintain blood sugar control with fasting blood sugars 90-130 daily      Intervention Plan   Intervention  Nutrition handout(s) given to  patient.;Prescribe, educate and counsel regarding individualized specific dietary modifications aiming towards targeted core components such as weight, hypertension, lipid management, diabetes, heart failure and other comorbidities.    Expected Outcomes  Short Term Goal: Understand basic principles of dietary content, such as calories, fat, sodium, cholesterol and nutrients.;Long Term Goal: Adherence to prescribed nutrition plan.       Nutrition Assessments:   Nutrition Goals Re-Evaluation: Nutrition Goals Re-Evaluation    [place] Name 02/08/19 1138             Goals   Current Weight  141 lb 8.6 oz (64.2 kg)       Nutrition Goal  Increase fiber rich carbohydrates to meet fiber recommendation of 25-30 grams/day       Expected Outcome  More fiber rich snacks vs graham crackers or candy bars         Personal Goal #2 Re-Evaluation   Personal Goal #2  Maintain blood sugar control with fasting blood sugars 90-130 daily          Nutrition Goals Discharge (Final Nutrition Goals Re-Evaluation): Nutrition Goals Re-Evaluation - 02/08/19 1138      Goals   Current Weight  141 lb 8.6 oz (64.2 kg)    Nutrition Goal  Increase fiber rich carbohydrates to meet fiber recommendation of 25-30 grams/day    Expected Outcome  More fiber rich snacks vs graham crackers or candy bars      Personal Goal #2 Re-Evaluation   Personal Goal #2  Maintain blood sugar control with fasting blood sugars 90-130 daily       Psychosocial: Target Goals: Acknowledge presence or absence of significant depression and/or stress, maximize coping skills, provide positive support system. Participant is able to verbalize types and ability to use techniques and skills needed for reducing stress and depression.  Initial Review & Psychosocial Screening: Initial Psych Review & Screening - 12/21/18 0904      Initial Review   Current issues with  Current Stress Concerns    Source of Stress Concerns  Chronic Illness;Occupation     Comments  Ms. Howton admits to life stressors including her adjustment to having a diagnosis of MI/CAD and work. She feels these stressors are managable with family support and will not be a barrier to her participation in CR. She will continue to maintain a positive attitude and utilize family, friends, and healthcare team for  support.      Family Dynamics   Good Support System?  Yes      Barriers   Psychosocial barriers to participate in program  The patient should benefit from training in stress management and relaxation.;Psychosocial barriers identified (see note)      Screening Interventions   Interventions  Encouraged to exercise;To provide support and resources with identified psychosocial needs;Provide feedback about the scores to participant    Expected Outcomes  Short Term goal: Utilizing psychosocial counselor, staff and physician to assist with identification of specific Stressors or current issues interfering with healing process. Setting desired goal for each stressor or current issue identified.;Long Term Goal: Stressors or current issues are controlled or eliminated.;Short Term goal: Identification and review with participant of any Quality of Life or Depression concerns found by scoring the questionnaire.;Long Term goal: The participant improves quality of Life and PHQ9 Scores as seen by post scores and/or verbalization of changes       Quality of Life Scores: Quality of Life - 12/21/18 0931      Quality of Life   Select  Quality of Life      Quality of Life Scores   Health/Function Pre  17.27 %    Socioeconomic Pre  20.71 %    Psych/Spiritual Pre  18.5 %    Family Pre  22.7 %    GLOBAL Pre  19.03 %      Scores of 19 and below usually indicate a poorer quality of life in these areas.  A difference of  2-3 points is a clinically meaningful difference.  A difference of 2-3 points in the total score of the Quality of Life Index has been associated with significant  improvement in overall quality of life, self-image, physical symptoms, and general health in studies assessing change in quality of life.  PHQ-9: Recent Review Flowsheet Data    Depression screen [facility] 2/9 12/21/2018 03/12/2018   Decreased Interest 0 0   Down, Depressed, Hopeless 0 0   PHQ - 2 Score 0 0     Interpretation of Total Score  Total Score Depression Severity:  1-4 = Minimal depression, 5-9 = Mild depression, 10-14 = Moderate depression, 15-19 = Moderately severe depression, 20-27 = Severe depression   Psychosocial Evaluation and Intervention: Psychosocial Evaluation - 12/28/18 0656      Psychosocial Evaluation & Interventions   Interventions  --       Psychosocial Re-Evaluation: Psychosocial Re-Evaluation    Row Name 01/12/19 1037 02/10/19 0805           Psychosocial Re-Evaluation   Current issues with  Current Stress Concerns  Current Stress Concerns      Comments  Patient continue to stress about her CAD risk factors and the possibility of another cardiac event. She is doing all she can to modify those risk factors and now in the acceptance phase of her grief process. She grieves the loss of her "health" however acknolodges a strong family support system. She states her anxiety and stress levels over her health is beginning to lessen as she learns more about risk modification. She is maintaining a positive attitude and outlook.  Patient states she is more confident about her future health and outlook. She is more confident about her exercise routein and smoking cessasion. She admits to having some anxiety about the current pandemic, her pre-existing conditions, and how she will recover if she were to get Covid. She continues to use her family and friends for support.  Expected Outcomes  Patient wil continue to maintain a positive outlook. She will lean on family and friends for emotional support and reassurance. She will continue to enjoy her hobbies and utilize her  health care team for additional psychosocial support if needs arise.  Patient wil continue to maintain a positive outlook. She will lean on family and friends for emotional support and reassurance. She will continue to enjoy her hobbies and utilize her health care team for additional psychosocial support if needs arise.      Interventions  Encouraged to attend Cardiac Rehabilitation for the exercise  Encouraged to attend Cardiac Rehabilitation for the exercise;Stress management education      Continue Psychosocial Services   Follow up required by staff  Follow up required by staff         Psychosocial Discharge (Final Psychosocial Re-Evaluation): Psychosocial Re-Evaluation - 02/10/19 0805      Psychosocial Re-Evaluation   Current issues with  Current Stress Concerns    Comments  Patient states she is more confident about her future health and outlook. She is more confident about her exercise routein and smoking cessasion. She admits to having some anxiety about the current pandemic, her pre-existing conditions, and how she will recover if she were to get Covid. She continues to use her family and friends for support.    Expected Outcomes  Patient wil continue to maintain a positive outlook. She will lean on family and friends for emotional support and reassurance. She will continue to enjoy her hobbies and utilize her health care team for additional psychosocial support if needs arise.    Interventions  Encouraged to attend Cardiac Rehabilitation for the exercise;Stress management education    Continue Psychosocial Services   Follow up required by staff       Vocational Rehabilitation: Provide vocational rehab assistance to qualifying candidates.   Vocational Rehab Evaluation & Intervention: Vocational Rehab - 12/21/18 [id number]      Initial Vocational Rehab Evaluation & Intervention   Assessment shows need for Vocational Rehabilitation  No       Education: Education Goals: Education classes  will be provided on a weekly basis, covering required topics. Participant will state understanding/return demonstration of topics presented.  Learning Barriers/Preferences:   Education Topics: Hypertension, Hypertension Reduction -Define heart disease and high blood pressure. Discus how high blood pressure affects the body and ways to reduce high blood pressure.   Exercise and Your Heart -Discuss why it is important to exercise, the FITT principles of exercise, normal and abnormal responses to exercise, and how to exercise safely.   Angina -Discuss definition of angina, causes of angina, treatment of angina, and how to decrease risk of having angina.   Cardiac Medications -Review what the following cardiac medications are used for, how they affect the body, and side effects that may occur when taking the medications.  Medications include Aspirin, Beta blockers, calcium channel blockers, ACE Inhibitors, angiotensin receptor blockers, diuretics, digoxin, and antihyperlipidemics.   Congestive Heart Failure -Discuss the definition of CHF, how to live with CHF, the signs and symptoms of CHF, and how keep track of weight and sodium intake.   Heart Disease and Intimacy -Discus the effect sexual activity has on the heart, how changes occur during intimacy as we age, and safety during sexual activity.   Smoking Cessation / COPD -Discuss different methods to quit smoking, the health benefits of quitting smoking, and the definition of COPD.   Nutrition I: Fats -Discuss the types of cholesterol, what  cholesterol does to the heart, and how cholesterol levels can be controlled.   Nutrition II: Labels -Discuss the different components of food labels and how to read food label   Heart Parts/Heart Disease and PAD -Discuss the anatomy of the heart, the pathway of blood circulation through the heart, and these are affected by heart disease.   Stress I: Signs and Symptoms -Discuss the  causes of stress, how stress may lead to anxiety and depression, and ways to limit stress.   Stress II: Relaxation -Discuss different types of relaxation techniques to limit stress.   Warning Signs of Stroke / TIA -Discuss definition of a stroke, what the signs and symptoms are of a stroke, and how to identify when someone is having stroke.   Knowledge Questionnaire Score: Knowledge Questionnaire Score - 12/21/18 0931      Knowledge Questionnaire Score   Pre Score  25/28       Core Components/Risk Factors/Patient Goals at Admission: Personal Goals and Risk Factors at Admission - 12/21/18 0854      Core Components/Risk Factors/Patient Goals on Admission   Tobacco Cessation  Yes    Number of packs per day  0.5    Intervention  Offer self-teaching materials, assist with locating and accessing local/national Quit Smoking programs, and support quit date choice.;Assist the participant in steps to quit. Provide individualized education and counseling about committing to Tobacco Cessation, relapse prevention, and pharmacological support that can be provided by physician.    Expected Outcomes  Short Term: Will demonstrate readiness to quit, by selecting a quit date.;Long Term: Complete abstinence from all tobacco products for at least 12 months from quit date.;Short Term: Will quit all tobacco product use, adhering to prevention of relapse plan.    Diabetes  Yes    Intervention  Provide education about signs/symptoms and action to take for hypo/hyperglycemia.;Provide education about proper nutrition, including hydration, and aerobic/resistive exercise prescription along with prescribed medications to achieve blood glucose in normal ranges: Fasting glucose 65-99 mg/dL    Expected Outcomes  Short Term: Participant verbalizes understanding of the signs/symptoms and immediate care of hyper/hypoglycemia, proper foot care and importance of medication, aerobic/resistive exercise and nutrition plan for  blood glucose control.;Long Term: Attainment of HbA1C < 7%.    Stress  Yes    Intervention  Offer individual and/or small group education and counseling on adjustment to heart disease, stress management and health-related lifestyle change. Teach and support self-help strategies.;Refer participants experiencing significant psychosocial distress to appropriate mental health specialists for further evaluation and treatment. When possible, include family members and significant others in education/counseling sessions.    Expected Outcomes  Short Term: Participant demonstrates changes in health-related behavior, relaxation and other stress management skills, ability to obtain effective social support, and compliance with psychotropic medications if prescribed.;Long Term: Emotional wellbeing is indicated by absence of clinically significant psychosocial distress or social isolation.       Core Components/Risk Factors/Patient Goals Review:  Goals and Risk Factor Review    Row Name 12/28/18 0706 01/12/19 1646 02/10/19 0809         Core Components/Risk Factors/Patient Goals Review   Personal Goals Review  Tobacco Cessation;Diabetes;Stress  Tobacco Cessation;Diabetes;Stress  -     Review  Patient with multiple CAD risk factors. She is extremely eager to participate in CR to improve outcomes.  Patient with multiple CAD risk factors. She continues to be eager to participate in CR to improve outcomes. Her diabetes is controlled with blood sugars within parameters  for exercise. She continues smoking cessasion and is managing her stress in a healthy way by spending time with family.  Patient with multiple CAD risk factors. She continues to be eager to participate in CR to improve outcomes. Her diabetes is controlled with blood sugars within parameters for exercise. She continues smoking cessasion and is managing her stress in a healthy way by spending time with family.     Expected Outcomes  Patient will continue  to participate in CR to modify her risk factors. Her goals are to establish an exercise routein and decrease her risk for another cardiac event.  Patient will continue to participate in CR to modify her risk factors. Her goals are to establish an exercise routein and decrease her risk for another cardiac event.  Patient will continue to participate in CR to modify her risk factors. Her goals are to establish an exercise routein and decrease her risk for another cardiac event.        Core Components/Risk Factors/Patient Goals at Discharge (Final Review):  Goals and Risk Factor Review - 02/10/19 0809      Core Components/Risk Factors/Patient Goals Review   Review  Patient with multiple CAD risk factors. She continues to be eager to participate in CR to improve outcomes. Her diabetes is controlled with blood sugars within parameters for exercise. She continues smoking cessasion and is managing her stress in a healthy way by spending time with family.    Expected Outcomes  Patient will continue to participate in CR to modify her risk factors. Her goals are to establish an exercise routein and decrease her risk for another cardiac event.       ITP Comments: ITP Comments    Row Name 12/21/18 [phone] 12/28/18 0656 01/12/19 1032 02/10/19 0803     ITP Comments  Medical Director- Dr. Fransico Him, MD  Patient started CR exercise today and tolerated well.  30 day ITP review: Pam continues to do well in CR. She is tolerating workload increases and she seems to have more self assurance about managing her CAD risk factors including DM. VSS. Patient denies complaints or cardiac symptoms.  30 day ITP review: Pam continues to do well in CR. She is tolerating workload increases and she seems to have more self assurance about managing her CAD risk factors including DM. VSS. Patient denies complaints or cardiac symptoms. She does struggle with some shortenss of breath secondary to exercising with mask. Smoking cessasion  continues. She is using gum/patches.       Comments: see ITP comments

## 2019-02-11 ENCOUNTER — Encounter (HOSPITAL_COMMUNITY)
Admission: RE | Admit: 2019-02-11 | Discharge: 2019-02-11 | Disposition: A | Discharge status: Home / Self Care | Payer: 59 | Source: Ambulatory Visit | Attending: Interventional Cardiology | Admitting: Interventional Cardiology

## 2019-02-11 ENCOUNTER — Other Ambulatory Visit: Payer: Self-pay

## 2019-02-11 DIAGNOSIS — I2102 ST elevation (STEMI) myocardial infarction involving left anterior descending coronary artery: Secondary | ICD-10-CM | POA: Diagnosis not present

## 2019-02-11 DIAGNOSIS — Z955 Presence of coronary angioplasty implant and graft: Secondary | ICD-10-CM

## 2019-02-14 ENCOUNTER — Other Ambulatory Visit: Payer: Self-pay

## 2019-02-14 ENCOUNTER — Encounter (HOSPITAL_COMMUNITY)
Admission: RE | Admit: 2019-02-14 | Discharge: 2019-02-14 | Disposition: A | Discharge status: Home / Self Care | Payer: 59 | Source: Ambulatory Visit | Attending: Interventional Cardiology | Admitting: Interventional Cardiology

## 2019-02-14 DIAGNOSIS — Z955 Presence of coronary angioplasty implant and graft: Secondary | ICD-10-CM

## 2019-02-14 DIAGNOSIS — I2102 ST elevation (STEMI) myocardial infarction involving left anterior descending coronary artery: Secondary | ICD-10-CM

## 2019-02-16 ENCOUNTER — Encounter (HOSPITAL_COMMUNITY)
Admission: RE | Admit: 2019-02-16 | Discharge: 2019-02-16 | Disposition: A | Discharge status: Home / Self Care | Payer: 59 | Source: Ambulatory Visit | Attending: Interventional Cardiology | Admitting: Interventional Cardiology

## 2019-02-16 ENCOUNTER — Other Ambulatory Visit: Payer: Self-pay

## 2019-02-16 VITALS — Ht 65.0 in | Wt 140.7 lb

## 2019-02-16 DIAGNOSIS — I2102 ST elevation (STEMI) myocardial infarction involving left anterior descending coronary artery: Secondary | ICD-10-CM | POA: Diagnosis not present

## 2019-02-16 DIAGNOSIS — Z955 Presence of coronary angioplasty implant and graft: Secondary | ICD-10-CM

## 2019-02-18 ENCOUNTER — Other Ambulatory Visit: Payer: Self-pay

## 2019-02-18 ENCOUNTER — Encounter (HOSPITAL_COMMUNITY)
Admission: RE | Admit: 2019-02-18 | Discharge: 2019-02-18 | Disposition: A | Discharge status: Home / Self Care | Payer: 59 | Source: Ambulatory Visit | Attending: Interventional Cardiology | Admitting: Interventional Cardiology

## 2019-02-18 VITALS — BP 100/62 | HR 98 | Temp 97.9°F | Ht 65.0 in | Wt 139.3 lb

## 2019-02-18 DIAGNOSIS — Z955 Presence of coronary angioplasty implant and graft: Secondary | ICD-10-CM

## 2019-02-18 DIAGNOSIS — I2102 ST elevation (STEMI) myocardial infarction involving left anterior descending coronary artery: Secondary | ICD-10-CM

## 2019-02-18 NOTE — Progress Notes (Signed)
Discharge Progress Report  Patient Details  Name: Casey Mason MRN: [id number] Date of Birth: [birth year] Referring Provider:     CARDIAC REHAB PHASE II ORIENTATION from 12/21/2018 in [place]  Referring Provider  Daneen Schick, MD       Number of Visits: 19  Reason for Discharge:  Patient reached a stable level of exercise. Patient independent in their exercise. Patient has met program and personal goals.  Smoking History:  Social History   Tobacco Use  Smoking Status Former Smoker  . Packs/day: 0.50  . Years: 15.00  . Pack years: 7.50  . Types: Cigarettes  . Quit date: 11/14/2018  . Years since quitting: 0.3  Smokeless Tobacco Never Used    Diagnosis:  11/14/18 DES LAD  11/14/18 STEMI  ADL [facility]:   Initial Exercise Prescription: Initial Exercise Prescription - 12/21/18 0900      Date of Initial Exercise RX and Referring Provider   Date  12/21/18    Referring Provider  Daneen Schick, MD    Expected Discharge Date  02/18/19      Treadmill   MPH  3    Grade  0    Minutes  15    METs  3.3      NuStep   Level  3    SPM  85    Minutes  15    METs  3      Prescription Details   Frequency (times per week)  3    Duration  Progress to 30 minutes of continuous aerobic without signs/symptoms of physical distress      Intensity   THRR 40-80% of Max Heartrate  66-133    Ratings of Perceived Exertion  11-13    Perceived Dyspnea  0-4      Progression   Progression  Continue to progress workloads to maintain intensity without signs/symptoms of physical distress.      Resistance Training   Training Prescription  Yes    Weight  3lbs    Reps  10-15       Discharge Exercise Prescription (Final Exercise Prescription Changes): Exercise Prescription Changes - 02/18/19 1519      Response to Exercise   Blood Pressure (Admit)  100/62    Blood Pressure (Exercise)  122/82    Blood Pressure (Exit)  104/70    Heart Rate (Admit)  98  bpm    Heart Rate (Exercise)  119 bpm    Heart Rate (Exit)  86 bpm    Rating of Perceived Exertion (Exercise)  12    Symptoms  none    Duration  Progress to 30 minutes of  aerobic without signs/symptoms of physical distress    Intensity  THRR unchanged      Progression   Progression  Continue to progress workloads to maintain intensity without signs/symptoms of physical distress.    Average METs  4      Resistance Training   Training Prescription  Yes    Weight  3lbs    Reps  10-15    Time  10 Minutes      Interval Training   Interval Training  No      Treadmill   MPH  2.7    Grade  3    Minutes  15    METs  4.19      NuStep   Level  5    SPM  85    Minutes  15  METs  3.9      Home Exercise Plan   Plans to continue exercise at  Home (comment)   Walking   Frequency  Add 4 additional days to program exercise sessions.    Initial Home Exercises Provided  01/10/19       Functional Capacity: 6 Minute Walk    Row Name 12/21/18 0757 02/16/19 1520       6 Minute Walk   Phase  Initial  Discharge    Distance  1641 feet  1996 feet    Distance % Change  -  21.63 %    Distance Feet Change  -  355 ft    Walk Time  6 minutes  6 minutes    # of Rest Breaks  0  0    MPH  3.11  3.78    METS  4.28  5.2    RPE  11  8    Perceived Dyspnea   0  0    VO2 Peak  14.99  18.2    Symptoms  No  No    Resting HR  63 bpm  83 bpm    Resting BP  98/60  98/72    Resting Oxygen Saturation   96 %  -    Exercise Oxygen Saturation  during 6 min walk  99 %  -    Max Ex. HR  92 bpm  107 bpm    Max Ex. BP  104/70  126/60    2 Minute Post BP  100/66  92/62       Psychological, QOL, Others - Outcomes: PHQ 2/9: Depression screen [facility] 2/9 02/18/2019 12/21/2018 03/12/2018  Decreased Interest 0 0 0  Down, Depressed, Hopeless 0 0 0  PHQ - 2 Score 0 0 0    Quality of Life: Quality of Life - 02/18/19 0747      Quality of Life   Select  Quality of Life      Quality of Life Scores    Health/Function Pre  17.27 %    Health/Function Post  22.73 %    Health/Function % Change  31.62 %    Socioeconomic Pre  20.71 %    Socioeconomic Post  25 %    Socioeconomic % Change   20.71 %    Psych/Spiritual Pre  18.5 %    Psych/Spiritual Post  23.21 %    Psych/Spiritual % Change  25.46 %    Family Pre  22.7 %    Family Post  23.7 %    Family % Change  4.41 %    GLOBAL Pre  19.03 %    GLOBAL Post  23.39 %    GLOBAL % Change  22.91 %       Personal Goals: Goals established at orientation with interventions provided to work toward goal. Personal Goals and Risk Factors at Admission - 12/21/18 0854      Core Components/Risk Factors/Patient Goals on Admission   Tobacco Cessation  Yes    Number of packs per day  0.5    Intervention  Offer self-teaching materials, assist with locating and accessing local/national Quit Smoking programs, and support quit date choice.;Assist the participant in steps to quit. Provide individualized education and counseling about committing to Tobacco Cessation, relapse prevention, and pharmacological support that can be provided by physician.    Expected Outcomes  Short Term: Will demonstrate readiness to quit, by selecting a quit date.;Long Term: Complete abstinence from all tobacco products  for at least 12 months from quit date.;Short Term: Will quit all tobacco product use, adhering to prevention of relapse plan.    Diabetes  Yes    Intervention  Provide education about signs/symptoms and action to take for hypo/hyperglycemia.;Provide education about proper nutrition, including hydration, and aerobic/resistive exercise prescription along with prescribed medications to achieve blood glucose in normal ranges: Fasting glucose 65-99 mg/dL    Expected Outcomes  Short Term: Participant verbalizes understanding of the signs/symptoms and immediate care of hyper/hypoglycemia, proper foot care and importance of medication, aerobic/resistive exercise and nutrition plan  for blood glucose control.;Long Term: Attainment of HbA1C < 7%.    Stress  Yes    Intervention  Offer individual and/or small group education and counseling on adjustment to heart disease, stress management and health-related lifestyle change. Teach and support self-help strategies.;Refer participants experiencing significant psychosocial distress to appropriate mental health specialists for further evaluation and treatment. When possible, include family members and significant others in education/counseling sessions.    Expected Outcomes  Short Term: Participant demonstrates changes in health-related behavior, relaxation and other stress management skills, ability to obtain effective social support, and compliance with psychotropic medications if prescribed.;Long Term: Emotional wellbeing is indicated by absence of clinically significant psychosocial distress or social isolation.        Personal Goals Discharge: Goals and Risk Factor Review    Row Name 12/28/18 0706 01/12/19 1646 02/10/19 0809 02/18/19 1626       Core Components/Risk Factors/Patient Goals Review   Personal Goals Review  Tobacco Cessation;Diabetes;Stress  Tobacco Cessation;Diabetes;Stress  -  Tobacco Cessation;Diabetes;Stress    Review  Patient with multiple CAD risk factors. She is extremely eager to participate in CR to improve outcomes.  Patient with multiple CAD risk factors. She continues to be eager to participate in CR to improve outcomes. Her diabetes is controlled with blood sugars within parameters for exercise. She continues smoking cessasion and is managing her stress in a healthy way by spending time with family.  Patient with multiple CAD risk factors. She continues to be eager to participate in CR to improve outcomes. Her diabetes is controlled with blood sugars within parameters for exercise. She continues smoking cessasion and is managing her stress in a healthy way by spending time with family.  Patient with multiple  CAD risk factors. She continues to be eager to participate in CR to improve outcomes. Her diabetes is controlled with blood sugars within parameters for exercise. She continues smoking cessasion and is managing her stress in a healthy way. Casey Mason graduates today on 02/18/19.    Expected Outcomes  Patient will continue to participate in CR to modify her risk factors. Her goals are to establish an exercise routein and decrease her risk for another cardiac event.  Patient will continue to participate in CR to modify her risk factors. Her goals are to establish an exercise routein and decrease her risk for another cardiac event.  Patient will continue to participate in CR to modify her risk factors. Her goals are to establish an exercise routein and decrease her risk for another cardiac event.  Casey Mason graduates today from cardiac rehab. Casey Mason plans to continue exercise by using her equipment at home. Following diet and lifestyle modifications and continue smoking cessation with gum and lozneges       Exercise Goals and Review: Exercise Goals    Row Name 12/21/18 0747             Exercise Goals   Increase Physical  Activity  Yes       Intervention  Provide advice, education, support and counseling about physical activity/exercise needs.;Develop an individualized exercise prescription for aerobic and resistive training based on initial evaluation findings, risk stratification, comorbidities and participant's personal goals.       Expected Outcomes  Short Term: Attend rehab on a regular basis to increase amount of physical activity.;Long Term: Exercising regularly at least 3-5 days a week.;Long Term: Add in home exercise to make exercise part of routine and to increase amount of physical activity.       Increase Strength and Stamina  Yes       Intervention  Provide advice, education, support and counseling about physical activity/exercise needs.;Develop an individualized exercise prescription for aerobic and  resistive training based on initial evaluation findings, risk stratification, comorbidities and participant's personal goals.       Expected Outcomes  Short Term: Increase workloads from initial exercise prescription for resistance, speed, and METs.;Short Term: Perform resistance training exercises routinely during rehab and add in resistance training at home;Long Term: Improve cardiorespiratory fitness, muscular endurance and strength as measured by increased METs and functional capacity (6MWT)       Able to understand and use rate of perceived exertion (RPE) scale  Yes       Intervention  Provide education and explanation on how to use RPE scale       Expected Outcomes  Short Term: Able to use RPE daily in rehab to express subjective intensity level;Long Term:  Able to use RPE to guide intensity level when exercising independently       Knowledge and understanding of Target Heart Rate Range (THRR)  Yes       Intervention  Provide education and explanation of THRR including how the numbers were predicted and where they are located for reference       Expected Outcomes  Short Term: Able to state/look up THRR;Long Term: Able to use THRR to govern intensity when exercising independently;Short Term: Able to use daily as guideline for intensity in rehab       Able to check pulse independently  Yes       Intervention  Provide education and demonstration on how to check pulse in carotid and radial arteries.;Review the importance of being able to check your own pulse for safety during independent exercise       Expected Outcomes  Short Term: Able to explain why pulse checking is important during independent exercise;Long Term: Able to check pulse independently and accurately       Understanding of Exercise Prescription  Yes       Intervention  Provide education, explanation, and written materials on patient's individual exercise prescription       Expected Outcomes  Short Term: Able to explain program exercise  prescription;Long Term: Able to explain home exercise prescription to exercise independently          Exercise Goals Re-Evaluation: Exercise Goals Re-Evaluation    Row Name 12/27/18 1608 01/10/19 1547 02/09/19 1530 02/16/19 1530 02/21/19 0904     Exercise Goal Re-Evaluation   Exercise Goals Review  Able to understand and use rate of perceived exertion (RPE) scale;Increase Physical Activity  Able to understand and use rate of perceived exertion (RPE) scale;Increase Physical Activity;Knowledge and understanding of Target Heart Rate Range (THRR);Understanding of Exercise Prescription  Able to understand and use rate of perceived exertion (RPE) scale;Increase Physical Activity;Knowledge and understanding of Target Heart Rate Range (THRR);Understanding of Exercise Prescription  Able  to understand and use rate of perceived exertion (RPE) scale;Increase Physical Activity;Knowledge and understanding of Target Heart Rate Range (THRR);Understanding of Exercise Prescription;Increase Strength and Stamina;Able to check pulse independently  Able to understand and use rate of perceived exertion (RPE) scale;Increase Physical Activity;Knowledge and understanding of Target Heart Rate Range (THRR);Understanding of Exercise Prescription;Increase Strength and Stamina;Able to check pulse independently   Comments  Patient able to understand and use RPE scale appropriately. Decreased warm-up and exercise speed on treadmill to allow patient to acclimate to using the treadmill. Tolerated well with decreased speed.  Reviewed home exercise guidelines with patient including THRR, RPE scale, and endpoints for exercise. Pt is walking 15-20 minutes, at least 5 days/week. Pt also, has 3lbs hand weights at home that she's using.  Patient is progressing well with exercise, increasing workloads appropriately. Pt is walking or riding her stationary bike as her mode of home exercise.  Patient's functional capacity increased 22% as measured by  6MWT, strength stayed the same, and flexibility increased 19%. Pt plans to continue walking 20-30 minutes at least 5 days/week upon completion of the cardiac rehab program. Pt also has 2lb weights at home that she's using for her resistance exerciess.  Patient completed the phase 2 cardiac rehab and will continue exercise independently at this time. Patient is walking and has a stationary bike at home. Pt also has 2 lb weights for her resistance training.   Expected Outcomes  Progress workloads as tolerated to help improve cardiorespiratory fitness.  Patient will continue home exercise routine in addition to exercise at cardiac rehab to help achieve personal health and fitness goals.  Patient will continue exercise: walking and/or riding stationary bike as her mode of exercise upon completion of the cardiac rehab program.  Pt will continue walking and/or riding her stationary bike 30 minutes, 5 days/week to continue her exercise routine.  Pt will continue walking and/or riding her stationary bike 30 minutes, 5 days/week to continue her exercise routine.      Nutrition & Weight - Outcomes: Pre Biometrics - 12/21/18 0747      Pre Biometrics   Height  _0  (1.651 m)    Weight  141 lb 12.1 oz (64.3 kg)    Waist Circumference  31.75 inches    Hip Circumference  38 inches    Waist to Hip Ratio  0.84 %    BMI (Calculated)  23.59    Triceps Skinfold  16 mm    % Body Fat  31.6 %    Grip Strength  35.5 kg    Flexibility  13 in    Single Leg Stand  30 seconds      Post Biometrics - 02/18/19 1519       Post  Biometrics   Height  _1  (1.651 m)    Weight  139 lb 5.3 oz (63.2 kg)    Waist Circumference  30.75 inches    Hip Circumference  38.25 inches    Waist to Hip Ratio  0.8 %    BMI (Calculated)  23.19    Triceps Skinfold  16 mm    % Body Fat  31.2 %    Grip Strength  35.5 kg    Flexibility  15.5 in    Single Leg Stand  24.43 seconds       Nutrition: Nutrition Therapy & Goals -  02/08/19 1135      Nutrition Therapy   Diet  Carb modified/heart healthy    Drug/Food Interactions  Statins/Certain Fruits      Personal Nutrition Goals   Nutrition Goal  Increase fiber rich carbohydrates to meet fiber recommendation of 25-30 grams/day    Personal Goal #2  Maintain blood sugar control with fasting blood sugars 90-130 daily      Intervention Plan   Intervention  Nutrition handout(s) given to patient.;Prescribe, educate and counsel regarding individualized specific dietary modifications aiming towards targeted core components such as weight, hypertension, lipid management, diabetes, heart failure and other comorbidities.    Expected Outcomes  Short Term Goal: Understand basic principles of dietary content, such as calories, fat, sodium, cholesterol and nutrients.;Long Term Goal: Adherence to prescribed nutrition plan.       Nutrition Discharge:   Education Questionnaire Score: Knowledge Questionnaire Score - 02/18/19 0742      Knowledge Questionnaire Score   Pre Score  25/28    Post Score  26/28       Goals reviewed with patient; copy given to patient. Pt graduated from cardiac rehab program today with completion of 19 exercise sessions in Phase II. Pt maintained good attendance and progressed nicely during her participation in rehab as evidenced by increased MET level.   Medication list reconciled. Repeat  PHQ score- 0 Pt has made significant lifestyle changes and should be commended for her success. Pt feels she has achieved her goals during cardiac rehab.   Pt plans to continue exercise by walking and using her stationary bike at home. We are proud of Casey Mason's progress! Casey Mason increased her distance on her post exercise walk test and lost about 1 kg. Casey Mason continues to work on smoking cessation and is using nicotine lozenges.Barnet Pall, RN,BSN 03/07/2019 11:36 AM

## 2019-02-25 ENCOUNTER — Other Ambulatory Visit: Payer: Self-pay | Admitting: Cardiology

## 2019-03-28 ENCOUNTER — Other Ambulatory Visit: Payer: Self-pay | Admitting: *Deleted

## 2019-03-28 MED ORDER — PANTOPRAZOLE SODIUM 20 MG PO TBEC: 20.0000 mg | DELAYED_RELEASE_TABLET | Freq: Two times a day (BID) | ORAL | 0 refills | Status: DC

## 2019-04-06 LAB — COMPREHENSIVE METABOLIC PANEL: Calcium: 10.4 (ref 8.7–10.7)

## 2019-06-03 ENCOUNTER — Telehealth: Payer: Self-pay | Admitting: Interventional Cardiology

## 2019-06-03 NOTE — Telephone Encounter (Signed)
Patient wanted to know if she needed labs done before her next appointment. Please advise

## 2019-06-03 NOTE — Telephone Encounter (Signed)
Left detailed message on VM, ok per DPR.  Advised labs done in October and statin was DECREASED, so we would not necessarily repeat at this time.  Advised to come fasting in case we need to do labs.

## 2019-06-22 DIAGNOSIS — F5222 Female sexual arousal disorder: Secondary | ICD-10-CM | POA: Insufficient documentation

## 2019-07-05 DIAGNOSIS — N200 Calculus of kidney: Secondary | ICD-10-CM | POA: Insufficient documentation

## 2019-07-11 ENCOUNTER — Telehealth: Payer: Self-pay | Admitting: Interventional Cardiology

## 2019-07-11 DIAGNOSIS — I2510 Atherosclerotic heart disease of native coronary artery without angina pectoris: Secondary | ICD-10-CM | POA: Insufficient documentation

## 2019-07-11 NOTE — Telephone Encounter (Signed)
Pt having CYSTOSCOPY W/ URETEROSCOPY- flexible WITH STENT PLACEMENT on 4/16.  Ok to hold Brilinta x 3 days?

## 2019-07-11 NOTE — Telephone Encounter (Signed)
Pt c/o medication issue:  1. Name of Medication:  ticagrelor (BRILINTA) 90 MG TABS tablet  2. How are you currently taking this medication (dosage and times per day)? As directed   3. Are you having a reaction (difficulty breathing--STAT)? no  4. What is your medication issue? Pt has a surgery scheduled for Friday 04-16 and was told by her surgeon to hold this medication for 3 days prior to surgery. Dr. [name] told her that she should not stop taking this medicine under any circumstance. She wants to know who's instructions to follow

## 2019-07-12 NOTE — Telephone Encounter (Signed)
Yes, it is okay to hold Brilinta for 3 days then resume to complete 12 month course.

## 2019-07-12 NOTE — Telephone Encounter (Signed)
Spoke with pt and made her aware of recommendations per Dr. Smith.  Pt verbalized understanding and was appreciative for call.  °

## 2019-07-15 ENCOUNTER — Ambulatory Visit: Payer: 59 | Admitting: Interventional Cardiology

## 2019-07-20 LAB — COMPREHENSIVE METABOLIC PANEL: Calcium: 10.4 (ref 8.7–10.7)

## 2019-07-25 NOTE — Progress Notes (Signed)
Cardiology Office Note:    Date:  07/26/2019   ID:  MITZY COSTELLA, DOB June 07, 1965, MRN [id number]  PCP:  [name], MD  Cardiologist:  [name], MD   Referring MD: [name], MD   Chief Complaint  Patient presents with  . Coronary Artery Disease  . Hyperlipidemia  . Advice Only    tobacco    History of Present Illness:    Casey Mason is a 54 y.o. female with a hx of  DM II, tobacco use, hypertension,  hyperlipidemia, and CAD with anterior STEMI and LAD stent 11/14/2018.  He is doing well and denies angina.  We reviewed her prior angiogram before stenting and after stent implantation.  She is physically active at work.  She has had no angina.  She discontinued cigarette smoking.  She has not had excessive bleeding on aspirin and Brilinta.  Past Medical History:  Diagnosis Date  . Diabetes mellitus without complication ([place])   . Diverticulosis of colon   . Family history of adverse reaction to anesthesia    mother-- ponv  . History of colon polyps    2016- hyperplastic  . History of diverticulitis of colon   . Nephrolithiasis    right  . PONV (postoperative nausea and vomiting)   . Prolapse of female bladder, acquired   . Right ureteral stone     Past Surgical History:  Procedure Laterality Date  . BLADDER SUSPENSION    . CESAREAN SECTION  1988  . COLONOSCOPY  11-10-2014  in [place]  . CORONARY STENT INTERVENTION N/A 11/14/2018   Procedure: CORONARY STENT INTERVENTION;  Surgeon: Belva Crome, MD;  Location: [facility];  Service: Cardiovascular;  Laterality: N/A;  . CORONARY/GRAFT ACUTE MI REVASCULARIZATION N/A 11/14/2018   Procedure: Coronary/Graft Acute MI Revascularization;  Surgeon: Belva Crome, MD;  Location: [facility];  Service: Cardiovascular;  Laterality: N/A;  . CYSTOSCOPY WITH RETROGRADE PYELOGRAM, URETEROSCOPY AND STENT PLACEMENT Right 02/07/2016   Procedure: CYSTOSCOPY WITH RIGHT PYLEOGRAM  RIGHT URETEROSCOPY, , DIALATION OF  RIGHT URETER STRICUTURE FLEXIBLE RIGHT URETEROSCOPY, RIGHT PYELOSCOPY,  AND RIGHT DOUBLE STENT PLACEMENT WITH TETHER;  Surgeon: Carolan Clines, MD;  Location: [facility];  Service: Urology;  Laterality: Right;  . EXTRACORPOREAL SHOCK WAVE LITHOTRIPSY Right 09/20/2015  . LEFT HEART CATH AND CORONARY ANGIOGRAPHY N/A 11/14/2018   Procedure: LEFT HEART CATH AND CORONARY ANGIOGRAPHY;  Surgeon: Belva Crome, MD;  Location: [facility];  Service: Cardiovascular;  Laterality: N/A;  . ROTATOR CUFF REPAIR Right 2013 or 2014??  . TONSILLECTOMY  2003  . TUBAL LIGATION  1992  . VAGINAL HYSTERECTOMY  2005    Current Medications: Current Meds  Medication Sig  . aspirin 81 MG tablet Take 81 mg by mouth daily.  Marland Kitchen atorvastatin (LIPITOR) 40 MG tablet Take 1 tablet (40 mg total) by mouth daily.  . Cholecalciferol (VITAMIN D3) 2000 units TABS Take 2 tablets by mouth daily.   . Cinnamon 500 MG capsule Take 1,000 mg by mouth daily.   . Coenzyme Q10 100 MG capsule Take 100 mg by mouth daily.  . COLLAGEN PO Take 1,000 mg by mouth 2 (two) times daily.  . empagliflozin (JARDIANCE) 25 MG TABS tablet Take 25 mg by mouth daily.  Marland Kitchen ibandronate (BONIVA) 150 MG tablet Take 150 mg by mouth every 30 (thirty) days.  . magnesium oxide (MAG-OX) 400 MG tablet Take 400 mg by mouth daily.  . metFORMIN (GLUCOPHAGE)  500 MG tablet Take 1,000 mg by mouth 2 (two) times daily.  . metoprolol tartrate (LOPRESSOR) 25 MG tablet TAKE 0.5 TABLETS (12.5 MG TOTAL) BY MOUTH 2 (TWO) TIMES DAILY.  . Multiple Vitamin (MULTIVITAMIN) tablet Take 1 tablet by mouth daily.  . nitroGLYCERIN (NITROSTAT) 0.4 MG SL tablet Place 1 tablet (0.4 mg total) under the tongue every 5 (five) minutes x 3 doses as needed for chest pain.  . pantoprazole (PROTONIX) 20 MG tablet Take 1 tablet (20 mg total) by mouth 2 (two) times daily.  . ticagrelor (BRILINTA) 90 MG TABS tablet Take 1 tablet (90 mg total) by mouth 2 (two) times daily.  .  vitamin B-12 (CYANOCOBALAMIN) 100 MCG tablet Take 100 mcg by mouth daily.     Allergies:   Diphenhydramine-acetaminophen, Oxycodone, and Sulfa antibiotics   Social History   Socioeconomic History  . Marital status: Married    Spouse name: Denyse Amass  . Number of children: 2  . Years of education: 42  . Highest education level: Associate degree: occupational, Hotel manager, or vocational program  Occupational History  . Not on file  Tobacco Use  . Smoking status: Former Smoker    Packs/day: 0.50    Years: 15.00    Pack years: 7.50    Types: Cigarettes    Quit date: 11/14/2018    Years since quitting: 0.6  . Smokeless tobacco: Never Used  Substance and Sexual Activity  . Alcohol use: Yes    Alcohol/week: 0.0 standard drinks    Comment: occasional  . Drug use: No  . Sexual activity: Not on file  Other Topics Concern  . Not on file  Social History Narrative  . Not on file   Social Determinants of Health   Financial Resource Strain: Low Risk   . Difficulty of Paying Living Expenses: Not hard at all  Food Insecurity: No Food Insecurity  . Worried About Charity fundraiser in the Last Year: Never true  . Ran Out of Food in the Last Year: Never true  Transportation Needs: No Transportation Needs  . Lack of Transportation (Medical): No  . Lack of Transportation (Non-Medical): No  Physical Activity: Sufficiently Active  . Days of Exercise per Week: 6 days  . Minutes of Exercise per Session: 30 min  Stress: Stress Concern Present  . Feeling of Stress : To some extent  Social Connections: Slightly Isolated  . Frequency of Communication with Friends and Family: More than three times a week  . Frequency of Social Gatherings with Friends and Family: Never  . Attends Religious Services: 1 to 4 times per year  . Active Member of Clubs or Organizations: No  . Attends Archivist Meetings: Never  . Marital Status: Married     Family History: The patient's family history  includes Anemia in her mother; Emphysema in her mother. There is no history of Stomach cancer or Esophageal cancer.  ROS:   Please see the history of present illness.    Doing well.  Working without limitation.  No shortness of breath.  All other systems reviewed and are negative.  EKGs/Labs/Other Studies Reviewed:    The following studies were reviewed today: Coronary angiography November 14, 2018 with stent implantation: Diagnostic Dominance: Right  Intervention     EKG:  EKG normal sinus rhythm, poor R wave progression.  When compared to prior.  When compared to prior tracing, ST elevation has resolved.  Recent Labs: 11/15/2018: Magnesium 2.1 11/16/2018: BUN 16; Creatinine, Ser 0.74; Hemoglobin  13.6; Platelets 276; Potassium 3.9; Sodium 139 01/17/2019: ALT 32  Recent Lipid Panel    Component Value Date/Time   CHOL 79 (L) 01/17/2019 0736   TRIG 53 01/17/2019 0736   HDL 33 (L) 01/17/2019 0736   CHOLHDL 2.4 01/17/2019 0736   CHOLHDL 3.4 11/15/2018 0808   VLDL 18 11/15/2018 0808   LDLCALC 33 01/17/2019 0736    Physical Exam:    VS:  BP (!) 94/58   Pulse 69   Ht 5\' 5"  (1.651 m)   Wt 144 lb 6.4 oz (65.5 kg)   SpO2 95%   BMI 24.03 kg/m     Wt Readings from Last 3 Encounters:  07/26/19 144 lb 6.4 oz (65.5 kg)  02/18/19 139 lb 5.3 oz (63.2 kg)  02/16/19 140 lb 10.5 oz (63.8 kg)     GEN: Healthy-appearing. No acute distress HEENT: Normal NECK: No JVD. LYMPHATICS: No lymphadenopathy CARDIAC:  RRR without murmur, gallop, or edema. VASCULAR:  Normal Pulses. No bruits. RESPIRATORY:  Clear to auscultation without rales, wheezing or rhonchi  ABDOMEN: Soft, non-tender, non-distended, No pulsatile mass, MUSCULOSKELETAL: No deformity  SKIN: Warm and dry NEUROLOGIC:  Alert and oriented x 3 PSYCHIATRIC:  Normal affect   ASSESSMENT:    1. Coronary artery disease of native artery of native heart with stable angina pectoris ([place])   2. Type 2 diabetes mellitus with  complication, with long-term current use of insulin ([place])   3. Essential hypertension   4. Acute systolic heart failure ([place])   5. Hyperlipidemia with target LDL less than 70   6. Tobacco abuse   7. Educated about COVID-19 virus infection    PLAN:    In order of problems listed above:  1. Secondary prevention discussed.  Plan to discontinue Brilinta and August and start clopidogrel 300 mg with the last dose of Brilinta then 75 mg/day thereafter.  After being on aspirin and clopidogrel for 1 week, discontinue aspirin.   2. Hemoglobin A1c less than 7 is at target.  Most recent was 7.5.  She is on Jardiance and Metformin.  She can do better with her diet and exercise. 3. Blood pressure is low.  Decrease metoprolol tartrate to 12.5 mg/day for 1 week then discontinue. 4. No evidence of systolic heart failure or volume overload. 5. Most recent lipid panel revealed a total cholesterol less than 100.  L was 53. 6. She has discontinued smoking. 7. She has been vaccinated and is practicing social distancing.  Planning on a vacation to [place] later this month or early next month  Overall education and awareness concerning primary/secondary risk prevention was discussed in detail: LDL less than 70, hemoglobin A1c less than 7, blood pressure target less than 130/80 mmHg, >150 minutes of moderate aerobic activity per week, avoidance of smoking, weight control (via diet and exercise), and continued surveillance/management of/for obstructive sleep apnea.    Medication Adjustments/Labs and Tests Ordered: Current medicines are reviewed at length with the patient today.  Concerns regarding medicines are outlined above.  No orders of the defined types were placed in this encounter.  No orders of the defined types were placed in this encounter.   There are no Patient Instructions on file for this visit.   Signed, [name], MD  07/26/2019 1:48 PM    [facility]

## 2019-07-26 ENCOUNTER — Encounter: Payer: Self-pay | Admitting: Interventional Cardiology

## 2019-07-26 ENCOUNTER — Ambulatory Visit: Payer: 59 | Admitting: Interventional Cardiology

## 2019-07-26 ENCOUNTER — Other Ambulatory Visit: Payer: Self-pay

## 2019-07-26 VITALS — BP 94/58 | HR 69 | Ht 65.0 in | Wt 144.4 lb

## 2019-07-26 DIAGNOSIS — E118 Type 2 diabetes mellitus with unspecified complications: Secondary | ICD-10-CM

## 2019-07-26 DIAGNOSIS — I5021 Acute systolic (congestive) heart failure: Secondary | ICD-10-CM | POA: Diagnosis not present

## 2019-07-26 DIAGNOSIS — I25118 Atherosclerotic heart disease of native coronary artery with other forms of angina pectoris: Secondary | ICD-10-CM

## 2019-07-26 DIAGNOSIS — Z794 Long term (current) use of insulin: Secondary | ICD-10-CM

## 2019-07-26 DIAGNOSIS — Z7189 Other specified counseling: Secondary | ICD-10-CM

## 2019-07-26 DIAGNOSIS — Z720 Tobacco use: Secondary | ICD-10-CM

## 2019-07-26 DIAGNOSIS — I10 Essential (primary) hypertension: Secondary | ICD-10-CM | POA: Diagnosis not present

## 2019-07-26 DIAGNOSIS — E785 Hyperlipidemia, unspecified: Secondary | ICD-10-CM

## 2019-07-26 NOTE — Patient Instructions (Signed)
Medication Instructions:  1) Contact me when you are getting low on Brilinta.  I will then send in Plavix.  On the last day of your Brilinta, you will take 4 tablets of Plavix along with your Brilinta.  After that, take just one tablet daily. 2) When you switch to Plavix, STOP Aspirin. 3) DECREASE Metoprolol to a half tablet daily for 1 week, then discontinue.  *If you need a refill on your cardiac medications before your next appointment, please call your pharmacy*   Lab Work: None If you have labs (blood work) drawn today and your tests are completely normal, you will receive your results only by: Marland Kitchen MyChart Message (if you have MyChart) OR . A paper copy in the mail If you have any lab test that is abnormal or we need to change your treatment, we will call you to review the results.   Testing/Procedures: None   Follow-Up: At [facility], you and your health needs are our priority.  As part of our continuing mission to provide you with exceptional heart care, we have created designated Provider Care Teams.  These Care Teams include your primary Cardiologist (physician) and Advanced Practice Providers (APPs -  Physician Assistants and Nurse Practitioners) who all work together to provide you with the care you need, when you need it.  We recommend signing up for the patient portal called "MyChart".  Sign up information is provided on this After Visit Summary.  MyChart is used to connect with patients for Virtual Visits (Telemedicine).  Patients are able to view lab/test results, encounter notes, upcoming appointments, etc.  Non-urgent messages can be sent to your provider as well.   To learn more about what you can do with MyChart, go to [email or web address].    Your next appointment:   8 month(s)  The format for your next appointment:   In Person  Provider:   You may see [name], MD or one of the following Advanced Practice Providers on your designated Care Team:     Truitt Merle, NP  Cecilie Kicks, NP  Kathyrn Drown, NP    Other Instructions

## 2019-08-23 ENCOUNTER — Other Ambulatory Visit: Payer: Self-pay | Admitting: Cardiology

## 2019-09-16 ENCOUNTER — Other Ambulatory Visit: Payer: Self-pay | Admitting: *Deleted

## 2019-09-16 MED ORDER — NITROGLYCERIN 0.4 MG SL SUBL: 0.4000 mg | SUBLINGUAL_TABLET | SUBLINGUAL | 2 refills | Status: DC | PRN

## 2019-10-17 ENCOUNTER — Telehealth: Payer: Self-pay | Admitting: Interventional Cardiology

## 2019-10-17 NOTE — Telephone Encounter (Signed)
Pt should run out of [place] in August. At that time she is to switch to Plavix 75mg  QD. She will need to take 4 tablets (300mg ) with her last dose of Brilinta. Stop ASA when she starts Plavix.   Called pt to review the above instructions that were given at the time of her last appt.  Left message to call back.

## 2019-10-19 NOTE — Telephone Encounter (Signed)
Spoke with pt and she states she has a whole bottle of Brilinta left and part of another.  Advised I will plan to call back in about mid August to discuss switching medication when she is getting low on the Brilinta.  Advised to call sooner if she sees that she is running out.  Pt appreciative for call.

## 2019-10-27 ENCOUNTER — Other Ambulatory Visit: Payer: Self-pay | Admitting: Pharmacist

## 2019-11-17 MED ORDER — CLOPIDOGREL BISULFATE 75 MG PO TABS: 75.0000 mg | ORAL_TABLET | Freq: Every day | ORAL | 3 refills | Status: DC

## 2019-11-17 NOTE — Telephone Encounter (Signed)
Spoke with pt and reviewed instructions on switching from Brilinta to Plavix.  Pt verbalized understanding and was appreciative for call.

## 2019-11-18 NOTE — Telephone Encounter (Signed)
Patient calling back to speak with Anderson Malta in regards to her plavix and a pamphlet that she got from [facility] yesterday.

## 2019-11-18 NOTE — Telephone Encounter (Signed)
Spoke with pt and she states the pamphlet from the pharmacy said not to take more than one dose of plavix.  Reassured pt ok to take the 4 tablets x 1 dose that I mentioned to her yesterday.  She also inquired about stopping ASA because pamphlet mentions to continue ASA.  Advised per Dr. [name]- stop ASA and that it is something we've been doing over the last year.  Pt also inquired about alcohol.  Advised ok for the occasional drink but alcohol thins the blood as well, and if she were to fall or get injured, bleeding likely to be increased.  Pt verbalized understanding and was appreciative for call.

## 2019-12-29 ENCOUNTER — Ambulatory Visit: Payer: 59 | Attending: Internal Medicine

## 2019-12-29 DIAGNOSIS — Z23 Encounter for immunization: Secondary | ICD-10-CM

## 2019-12-29 NOTE — Progress Notes (Signed)
   Covid-19 Vaccination Clinic  Name:  Casey Mason    MRN: [id number] DOB: Jun 02, 1965  12/29/2019  Casey Mason was observed post Covid-19 immunization for 15 minutes without incident. She was provided with Vaccine Information Sheet and instruction to access the V-Safe system.   Casey Mason was instructed to call 911 with any severe reactions post vaccine: Marland Kitchen Difficulty breathing  . Swelling of face and throat  . A fast heartbeat  . A bad rash all over body  . Dizziness and weakness

## 2020-01-13 ENCOUNTER — Encounter: Payer: Self-pay | Admitting: *Deleted

## 2020-02-02 ENCOUNTER — Other Ambulatory Visit: Payer: Self-pay | Admitting: Interventional Cardiology

## 2020-03-11 NOTE — Progress Notes (Signed)
Cardiology Office Note:    Date:  03/12/2020   ID:  Casey Mason, DOB [birth year], MRN [id number]  PCP:  [name], MD  Cardiologist:  [name], MD   Referring MD: [name], MD   Chief Complaint  Patient presents with  . Coronary Artery Disease  . Congestive Heart Failure    History of Present Illness:    Casey Mason is a 54 y.o. female with a hx of DM II, tobacco use, hypertension, hyperlipidemia, and CAD with anterior STEMI and LAD stent 11/14/2018.  She is doing well. She has no cardiac complaints. She is on monotherapy with Plavix.  She has anxiety about recurrent events.  She is stop smoking cigarettes.  She is not getting 150 minutes of moderate activity.  Diabetes control has been difficult.  She denies claudication and neuro symptoms.  Past Medical History:  Diagnosis Date  . Diabetes mellitus without complication ([place])   . Diverticulosis of colon   . Family history of adverse reaction to anesthesia    mother-- ponv  . History of colon polyps    2016- hyperplastic  . History of diverticulitis of colon   . Nephrolithiasis    right  . PONV (postoperative nausea and vomiting)   . Prolapse of female bladder, acquired   . Right ureteral stone     Past Surgical History:  Procedure Laterality Date  . BLADDER SUSPENSION    . CESAREAN SECTION  1988  . COLONOSCOPY  11-10-2014  in [place]  . CORONARY STENT INTERVENTION N/A 11/14/2018   Procedure: CORONARY STENT INTERVENTION;  Surgeon: Belva Crome, MD;  Location: [facility];  Service: Cardiovascular;  Laterality: N/A;  . CORONARY/GRAFT ACUTE MI REVASCULARIZATION N/A 11/14/2018   Procedure: Coronary/Graft Acute MI Revascularization;  Surgeon: Belva Crome, MD;  Location: [facility];  Service: Cardiovascular;  Laterality: N/A;  . CYSTOSCOPY WITH RETROGRADE PYELOGRAM, URETEROSCOPY AND STENT PLACEMENT Right 02/07/2016   Procedure: CYSTOSCOPY WITH RIGHT PYLEOGRAM  RIGHT URETEROSCOPY, ,  DIALATION OF RIGHT URETER STRICUTURE FLEXIBLE RIGHT URETEROSCOPY, RIGHT PYELOSCOPY,  AND RIGHT DOUBLE STENT PLACEMENT WITH TETHER;  Surgeon: Carolan Clines, MD;  Location: [facility];  Service: Urology;  Laterality: Right;  . EXTRACORPOREAL SHOCK WAVE LITHOTRIPSY Right 09/20/2015  . LEFT HEART CATH AND CORONARY ANGIOGRAPHY N/A 11/14/2018   Procedure: LEFT HEART CATH AND CORONARY ANGIOGRAPHY;  Surgeon: Belva Crome, MD;  Location: [facility];  Service: Cardiovascular;  Laterality: N/A;  . ROTATOR CUFF REPAIR Right 2013 or 2014??  . TONSILLECTOMY  2003  . TUBAL LIGATION  1992  . VAGINAL HYSTERECTOMY  2005    Current Medications: Current Meds  Medication Sig  . atorvastatin (LIPITOR) 40 MG tablet TAKE 1 TABLET BY MOUTH EVERY DAY  . Cholecalciferol (VITAMIN D3) 2000 units TABS Take 2 tablets by mouth daily.   . Cinnamon 500 MG capsule Take 1,000 mg by mouth daily.   . clopidogrel (PLAVIX) 75 MG tablet Take 1 tablet (75 mg total) by mouth daily.  . Coenzyme Q10 100 MG capsule Take 100 mg by mouth daily.  . COLLAGEN PO Take 1,000 mg by mouth 2 (two) times daily.  . Dulaglutide (TRULICITY) 1.5 QI/2.9NL SOPN Inject 1.5 mg into the skin once a week.  . empagliflozin (JARDIANCE) 25 MG TABS tablet Take 25 mg by mouth daily.  Marland Kitchen ibandronate (BONIVA) 150 MG tablet Take 150 mg by mouth every 30 (thirty) days.  . magnesium oxide (MAG-OX)  400 MG tablet Take 400 mg by mouth daily.  . metFORMIN (GLUCOPHAGE) 500 MG tablet Take 1,000 mg by mouth 2 (two) times daily.  . Multiple Vitamin (MULTIVITAMIN) tablet Take 1 tablet by mouth daily.  . nitroGLYCERIN (NITROSTAT) 0.4 MG SL tablet Place 1 tablet (0.4 mg total) under the tongue every 5 (five) minutes x 3 doses as needed for chest pain.  . vitamin B-12 (CYANOCOBALAMIN) 100 MCG tablet Take 100 mcg by mouth daily.     Allergies:   Diphenhydramine-acetaminophen, Oxycodone, and Sulfa antibiotics   Social History   Socioeconomic  History  . Marital status: Married    Spouse name: Denyse Amass  . Number of children: 2  . Years of education: 51  . Highest education level: Associate degree: occupational, Hotel manager, or vocational program  Occupational History  . Not on file  Tobacco Use  . Smoking status: Former Smoker    Packs/day: 0.50    Years: 15.00    Pack years: 7.50    Types: Cigarettes    Quit date: 11/14/2018    Years since quitting: 1.3  . Smokeless tobacco: Never Used  Vaping Use  . Vaping Use: Never used  Substance and Sexual Activity  . Alcohol use: Yes    Alcohol/week: 0.0 standard drinks    Comment: occasional  . Drug use: No  . Sexual activity: Not on file  Other Topics Concern  . Not on file  Social History Narrative  . Not on file   Social Determinants of Health   Financial Resource Strain: Not on file  Food Insecurity: Not on file  Transportation Needs: Not on file  Physical Activity: Not on file  Stress: Not on file  Social Connections: Not on file     Family History: The patient's family history includes Anemia in her mother; Emphysema in her mother. There is no history of Stomach cancer or Esophageal cancer.  ROS:   Please see the history of present illness.    Anxiety, decreased physical activity, otherwise no concerns.  All other systems reviewed and are negative.  EKGs/Labs/Other Studies Reviewed:    The following studies were reviewed today:  ECHOCARDIOGRAM 2020: IMPRESSIONS  1. Severe hypokinesis of the left ventricular, apical anterior wall.  2. The left ventricle has mildly reduced systolic function, with an  ejection fraction of 45-50%. The cavity size was normal. Left ventricular  diastolic Doppler parameters are consistent with impaired relaxation.  3. The right ventricle has normal systolic function. The cavity was  normal. There is no increase in right ventricular wall thickness.  4. Left atrial size was mildly dilated.  5. The mitral valve is grossly  normal. Moderate thickening of the mitral  valve leaflet.  6. The tricuspid valve is grossly normal.  7. The aortic valve is tricuspid.  8. The aorta is normal unless otherwise noted.   CATH PCI 2020 Diagnostic Dominance: Right    Intervention     LDL cholesterol 40, 09/29/2019;  Hemoglobin A1c 7.9 01/23/2020.  Trulicity uptitrated  EKG:  EKG 07/26/2019 demonstrated normal ECG.  Recent Labs: No results found for requested labs within last 8760 hours.  Recent Lipid Panel    Component Value Date/Time   CHOL 79 (L) 01/17/2019 0736   TRIG 53 01/17/2019 0736   HDL 33 (L) 01/17/2019 0736   CHOLHDL 2.4 01/17/2019 0736   CHOLHDL 3.4 11/15/2018 0808   VLDL 18 11/15/2018 0808   LDLCALC 33 01/17/2019 0736    Physical Exam:    VS:  BP 112/66   Pulse (!) 57   Ht 5\' 6"  (1.676 m)   Wt 140 lb (63.5 kg)   SpO2 98%   BMI 22.60 kg/m     Wt Readings from Last 3 Encounters:  03/12/20 140 lb (63.5 kg)  07/26/19 144 lb 6.4 oz (65.5 kg)  02/18/19 139 lb 5.3 oz (63.2 kg)     GEN: Notable with age. No acute distress HEENT: Normal NECK: No JVD. LYMPHATICS: No lymphadenopathy CARDIAC:  RRR without murmur, gallop, or edema. VASCULAR:  Normal Pulses. No bruits. RESPIRATORY:  Clear to auscultation without rales, wheezing or rhonchi  ABDOMEN: Soft, non-tender, non-distended, No pulsatile mass, MUSCULOSKELETAL: No deformity  SKIN: Warm and dry NEUROLOGIC:  Alert and oriented x 3 PSYCHIATRIC:  Normal affect   ASSESSMENT:    1. Coronary artery disease of native artery of native heart with stable angina pectoris ([place])   2. Type 2 diabetes mellitus with complication, with long-term current use of insulin ([place])   3. Essential hypertension   4. Acute systolic heart failure ([place])   5. Hyperlipidemia with target LDL less than 70   6. Tobacco abuse   7. Educated about COVID-19 virus infection    PLAN:    In order of problems listed above:  1. Secondary prevention is in full  effect.  Not getting 150 minutes of moderate activity per week.  This is strongly encouraged.  A1c when last checked was unacceptable, greater than 7.  Diabetic therapy was uptitrated. 2. Currently on Jardiance, Trulicity, Glucophage.  She is not on an ARB/ACE for kidney protection.  This needs to be considered. 3. Excellent blood pressure control on the current medical regimen without antihypertensive therapy.  We will continue to monitor.  Increase potassium in diet and low-sodium or discussed. 4. No clinical evidence of heart failure.  She has EF there is mid range for female at 50%.  Continue Jardiance.  Consider ARB for dual purpose of kidney protection and LV preservation. 5. Continue high intensity atorvastatin, 40 mg/day. 6. Smoking has been discontinued 7. Vaccinated and practicing social mitigation  Overall education and awareness concerning primary/secondary risk prevention was discussed in detail: LDL less than 70, hemoglobin A1c less than 7, blood pressure target less than 130/80 mmHg, >150 minutes of moderate aerobic activity per week, avoidance of smoking, weight control (via diet and exercise), and continued surveillance/management of/for obstructive sleep apnea.    Medication Adjustments/Labs and Tests Ordered: Current medicines are reviewed at length with the patient today.  Concerns regarding medicines are outlined above.  No orders of the defined types were placed in this encounter.  No orders of the defined types were placed in this encounter.   There are no Patient Instructions on file for this visit.   Signed, [name], MD  03/12/2020 8:08 AM    [place]

## 2020-03-12 ENCOUNTER — Other Ambulatory Visit: Payer: Self-pay

## 2020-03-12 ENCOUNTER — Ambulatory Visit: Payer: 59 | Admitting: Interventional Cardiology

## 2020-03-12 ENCOUNTER — Encounter: Payer: Self-pay | Admitting: Interventional Cardiology

## 2020-03-12 VITALS — BP 112/66 | HR 57 | Ht 66.0 in | Wt 140.0 lb

## 2020-03-12 DIAGNOSIS — I10 Essential (primary) hypertension: Secondary | ICD-10-CM | POA: Diagnosis not present

## 2020-03-12 DIAGNOSIS — Z794 Long term (current) use of insulin: Secondary | ICD-10-CM

## 2020-03-12 DIAGNOSIS — I5021 Acute systolic (congestive) heart failure: Secondary | ICD-10-CM | POA: Diagnosis not present

## 2020-03-12 DIAGNOSIS — Z7189 Other specified counseling: Secondary | ICD-10-CM

## 2020-03-12 DIAGNOSIS — E785 Hyperlipidemia, unspecified: Secondary | ICD-10-CM

## 2020-03-12 DIAGNOSIS — E118 Type 2 diabetes mellitus with unspecified complications: Secondary | ICD-10-CM | POA: Diagnosis not present

## 2020-03-12 DIAGNOSIS — I25118 Atherosclerotic heart disease of native coronary artery with other forms of angina pectoris: Secondary | ICD-10-CM

## 2020-03-12 DIAGNOSIS — Z720 Tobacco use: Secondary | ICD-10-CM

## 2020-03-12 NOTE — Patient Instructions (Signed)
Medication Instructions:  Your physician recommends that you continue on your current medications as directed. Please refer to the Current Medication list given to you today.  *If you need a refill on your cardiac medications before your next appointment, please call your pharmacy*   Lab Work: None If you have labs (blood work) drawn today and your tests are completely normal, you will receive your results only by: . MyChart Message (if you have MyChart) OR . A paper copy in the mail If you have any lab test that is abnormal or we need to change your treatment, we will call you to review the results.   Testing/Procedures: None   Follow-Up: At [facility], you and your health needs are our priority.  As part of our continuing mission to provide you with exceptional heart care, we have created designated Provider Care Teams.  These Care Teams include your primary Cardiologist (physician) and Advanced Practice Providers (APPs -  Physician Assistants and Nurse Practitioners) who all work together to provide you with the care you need, when you need it.  We recommend signing up for the patient portal called "MyChart".  Sign up information is provided on this After Visit Summary.  MyChart is used to connect with patients for Virtual Visits (Telemedicine).  Patients are able to view lab/test results, encounter notes, upcoming appointments, etc.  Non-urgent messages can be sent to your provider as well.   To learn more about what you can do with MyChart, go to [email or web address].    Your next appointment:   1 year(s)  The format for your next appointment:   In Person  Provider:   You may see Henry W Smith III, MD or one of the following Advanced Practice Providers on your designated Care Team:    Lori Gerhardt, NP  Laura Ingold, NP  Jill McDaniel, NP    Other Instructions   

## 2020-03-17 ENCOUNTER — Other Ambulatory Visit: Payer: Self-pay | Admitting: Interventional Cardiology

## 2020-06-21 ENCOUNTER — Other Ambulatory Visit (HOSPITAL_COMMUNITY): Payer: Self-pay | Admitting: Physician Assistant

## 2020-06-21 ENCOUNTER — Other Ambulatory Visit: Payer: Self-pay

## 2020-06-21 ENCOUNTER — Ambulatory Visit (HOSPITAL_COMMUNITY)
Admission: RE | Admit: 2020-06-21 | Discharge: 2020-06-21 | Disposition: A | Discharge status: Home / Self Care | Payer: 59 | Source: Ambulatory Visit | Attending: Physician Assistant | Admitting: Physician Assistant

## 2020-06-21 DIAGNOSIS — R2232 Localized swelling, mass and lump, left upper limb: Secondary | ICD-10-CM

## 2020-08-01 ENCOUNTER — Other Ambulatory Visit: Payer: Self-pay | Admitting: Interventional Cardiology

## 2020-08-21 IMAGING — CT CT ANGIOGRAPHY CHEST
2 of 6 series · 19 of 46 positions shown · IV contrast (Isovue)
Comparison: Chest CT dated 10/21/2013.

CLINICAL DATA: Left chest pain intermittently since yesterday,
constant and progressively worse since this morning at [DATE] a.m.
The pain radiates into the mandible and left arm. Shortness of
breath, dizziness and weakness this morning. Nausea and vomiting
yesterday.

EXAM:
CT ANGIOGRAPHY CHEST WITH CONTRAST
TECHNIQUE: Multidetector CT imaging of the chest was performed using the
standard protocol during bolus administration of intravenous
contrast. Multiplanar CT image reconstructions and MIPs were
obtained to evaluate the vascular anatomy.
CONTRAST:  100mL OMNIPAQUE IOHEXOL 350 MG/ML SOLN

[Series 5: 3 thins · axial · 0.62mm/px · z∈[-197,+87]mm · 16 of 312 slices shown]
[im 14/312  lung]
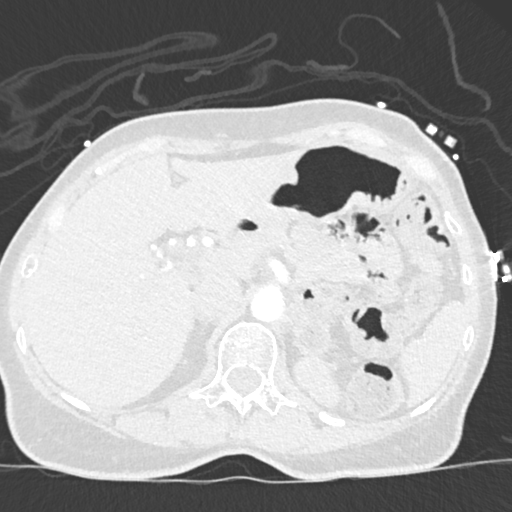
[im 41/312  soft-tissue]
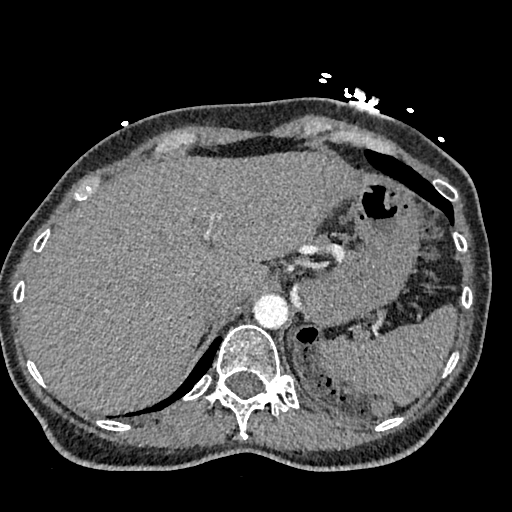
[im 55/312  lung]
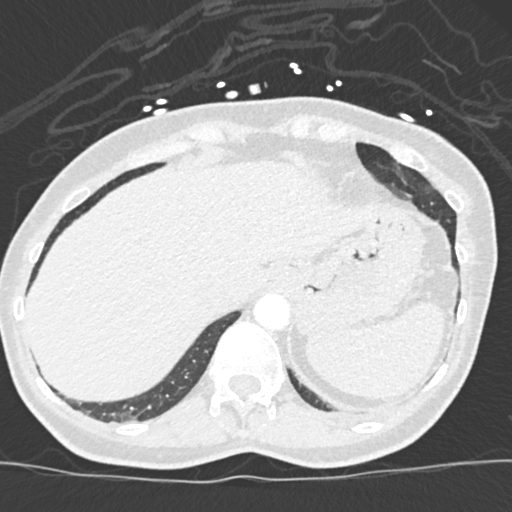
[im 68/312  soft-tissue]
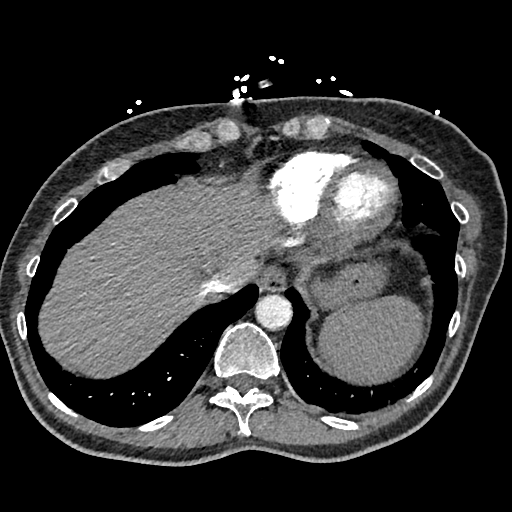
[im 95/312  lung]
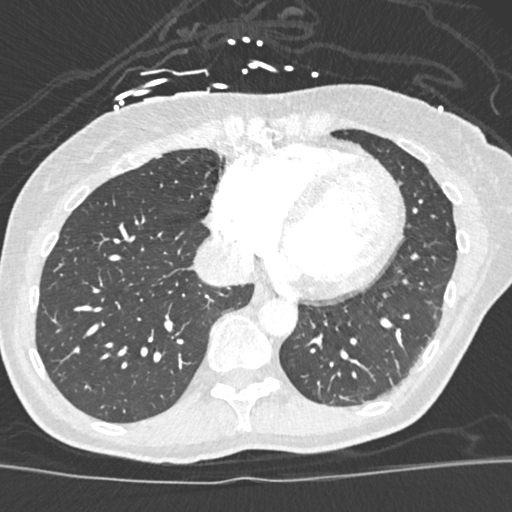
[im 109/312  soft-tissue]
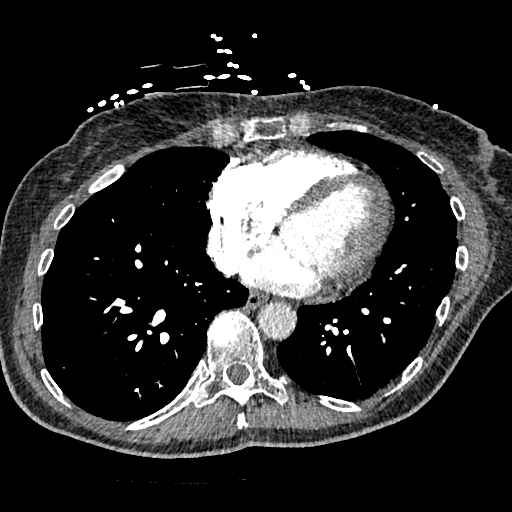
[im 122/312  lung]
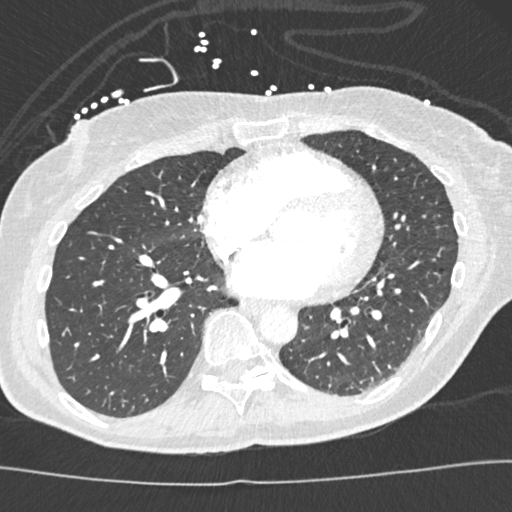
[im 149/312  soft-tissue]
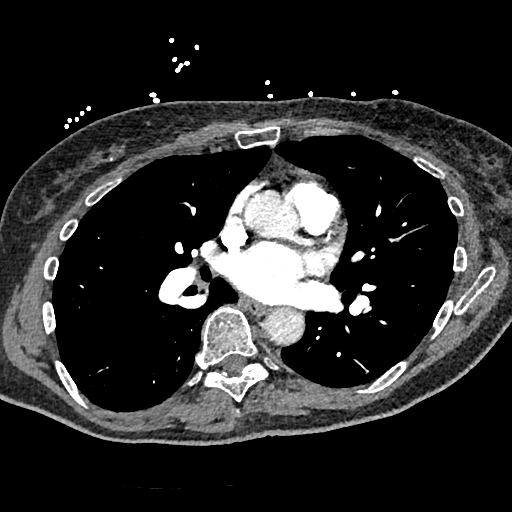
[im 163/312  lung]
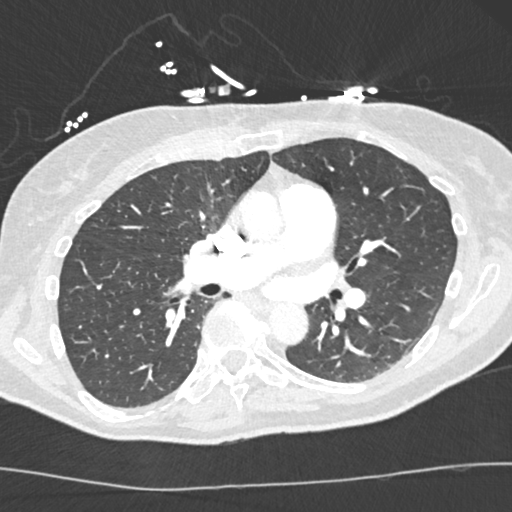
[im 190/312  soft-tissue]
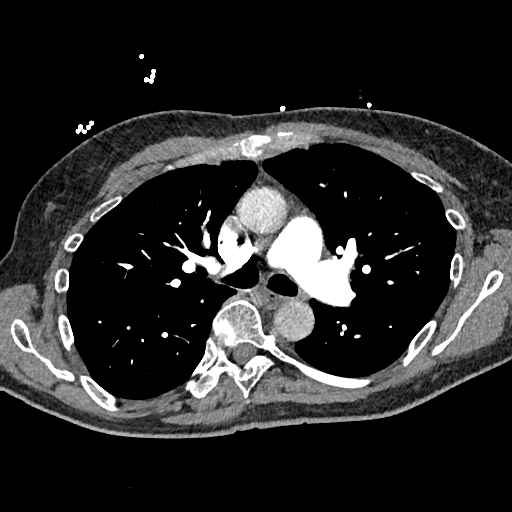
[im 203/312  lung]
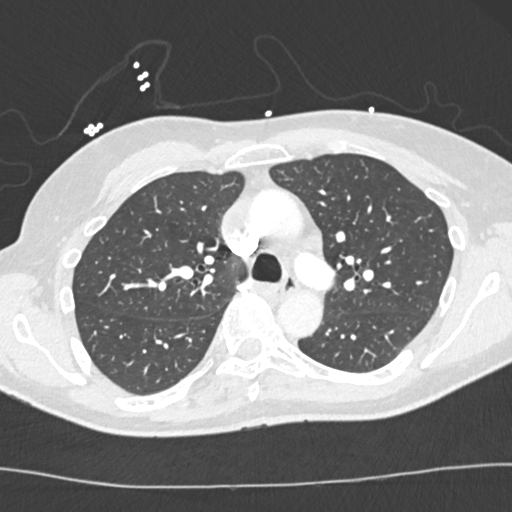
[im 217/312  soft-tissue]
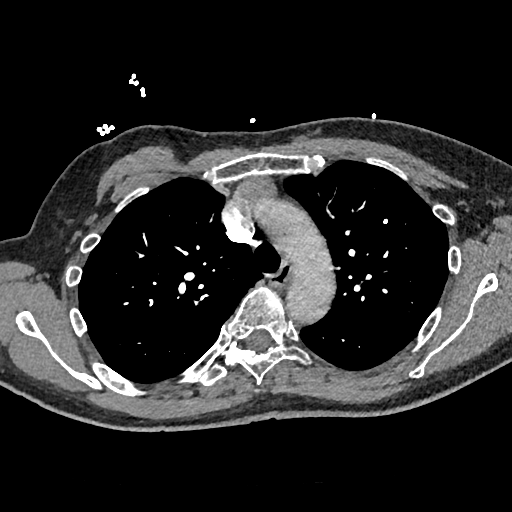
[im 244/312  lung]
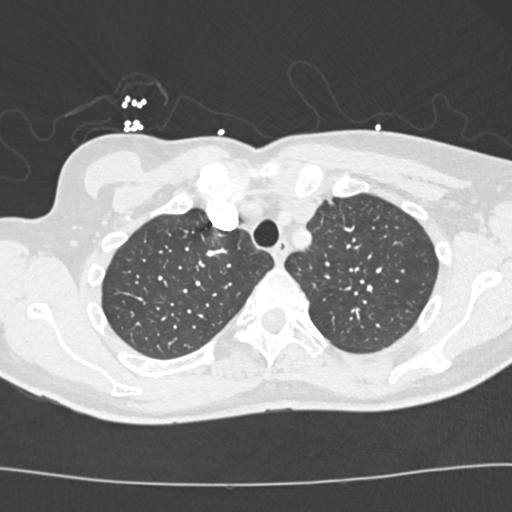
[im 257/312  soft-tissue]
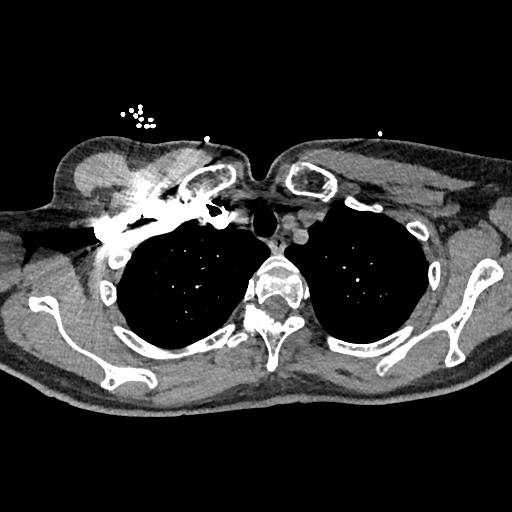
[im 271/312  lung]
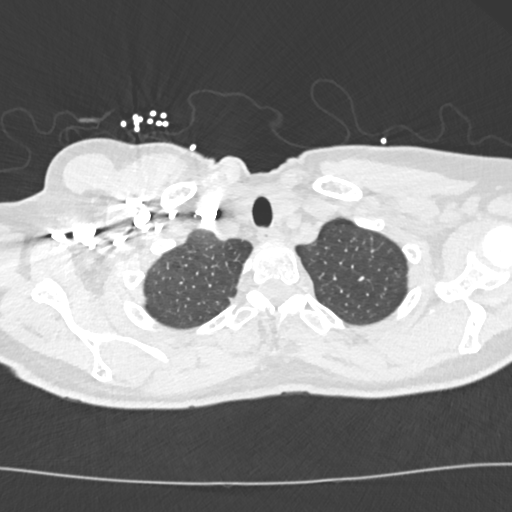
[im 298/312  soft-tissue]
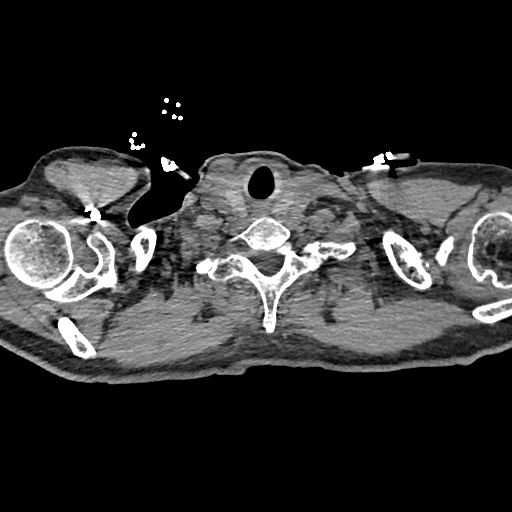

[Series 7: coronal mpr · coronal · 0.67mm/px · 3 of 138 slices shown]
[im 35/138  soft-tissue]
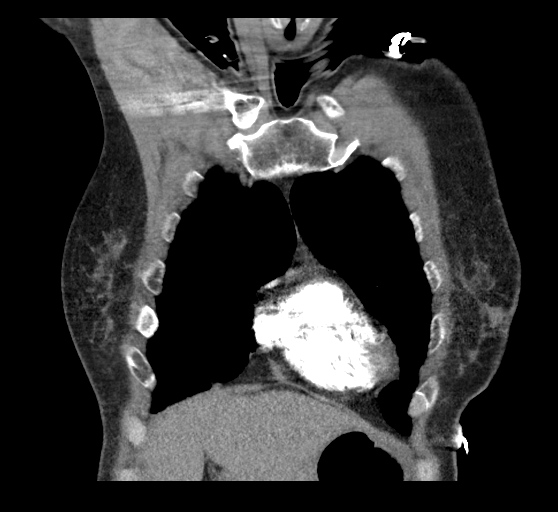
[im 69/138  soft-tissue]
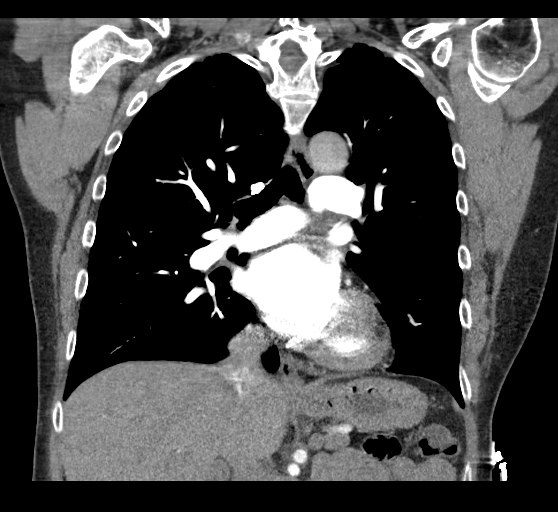
[im 103/138  soft-tissue]
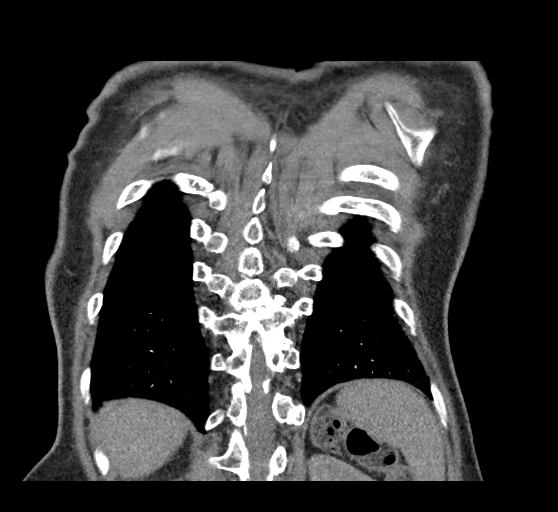

[19 of 46 positions shown; findings below may reference images not displayed]

FINDINGS: Cardiovascular: Satisfactory opacification of the pulmonary arteries
to the segmental level. No evidence of pulmonary embolism. Normal
heart size. No pericardial effusion.

Mediastinum/Nodes: No enlarged mediastinal, hilar, or axillary lymph
nodes. Thyroid gland, trachea, and esophagus demonstrate no
significant findings.

Lungs/Pleura: Minimal bibasilar atelectasis. Otherwise, clear lungs.
Small left lower lobe calcified granuloma. Small right lower lobe
calcified granuloma. No pleural fluid.

Upper Abdomen: Unremarkable.

Musculoskeletal: Mild-to-moderate scoliosis. Mild midthoracic
lordosis. Mild thoracic spine degenerative changes.

Review of the MIP images confirms the above findings.
IMPRESSION: No pulmonary emboli or acute abnormality.

## 2020-08-21 IMAGING — DX CHEST - 2 VIEW
2 series · 2 of 2 positions shown · non-contrast
Comparison: None.

CLINICAL DATA: Left-sided chest pain beginning yesterday which
radiates to the left arm and jaw. Shortness of breath. Dizziness.
Nausea and vomiting.

EXAM:
CHEST - 2 VIEW

[chest lat]
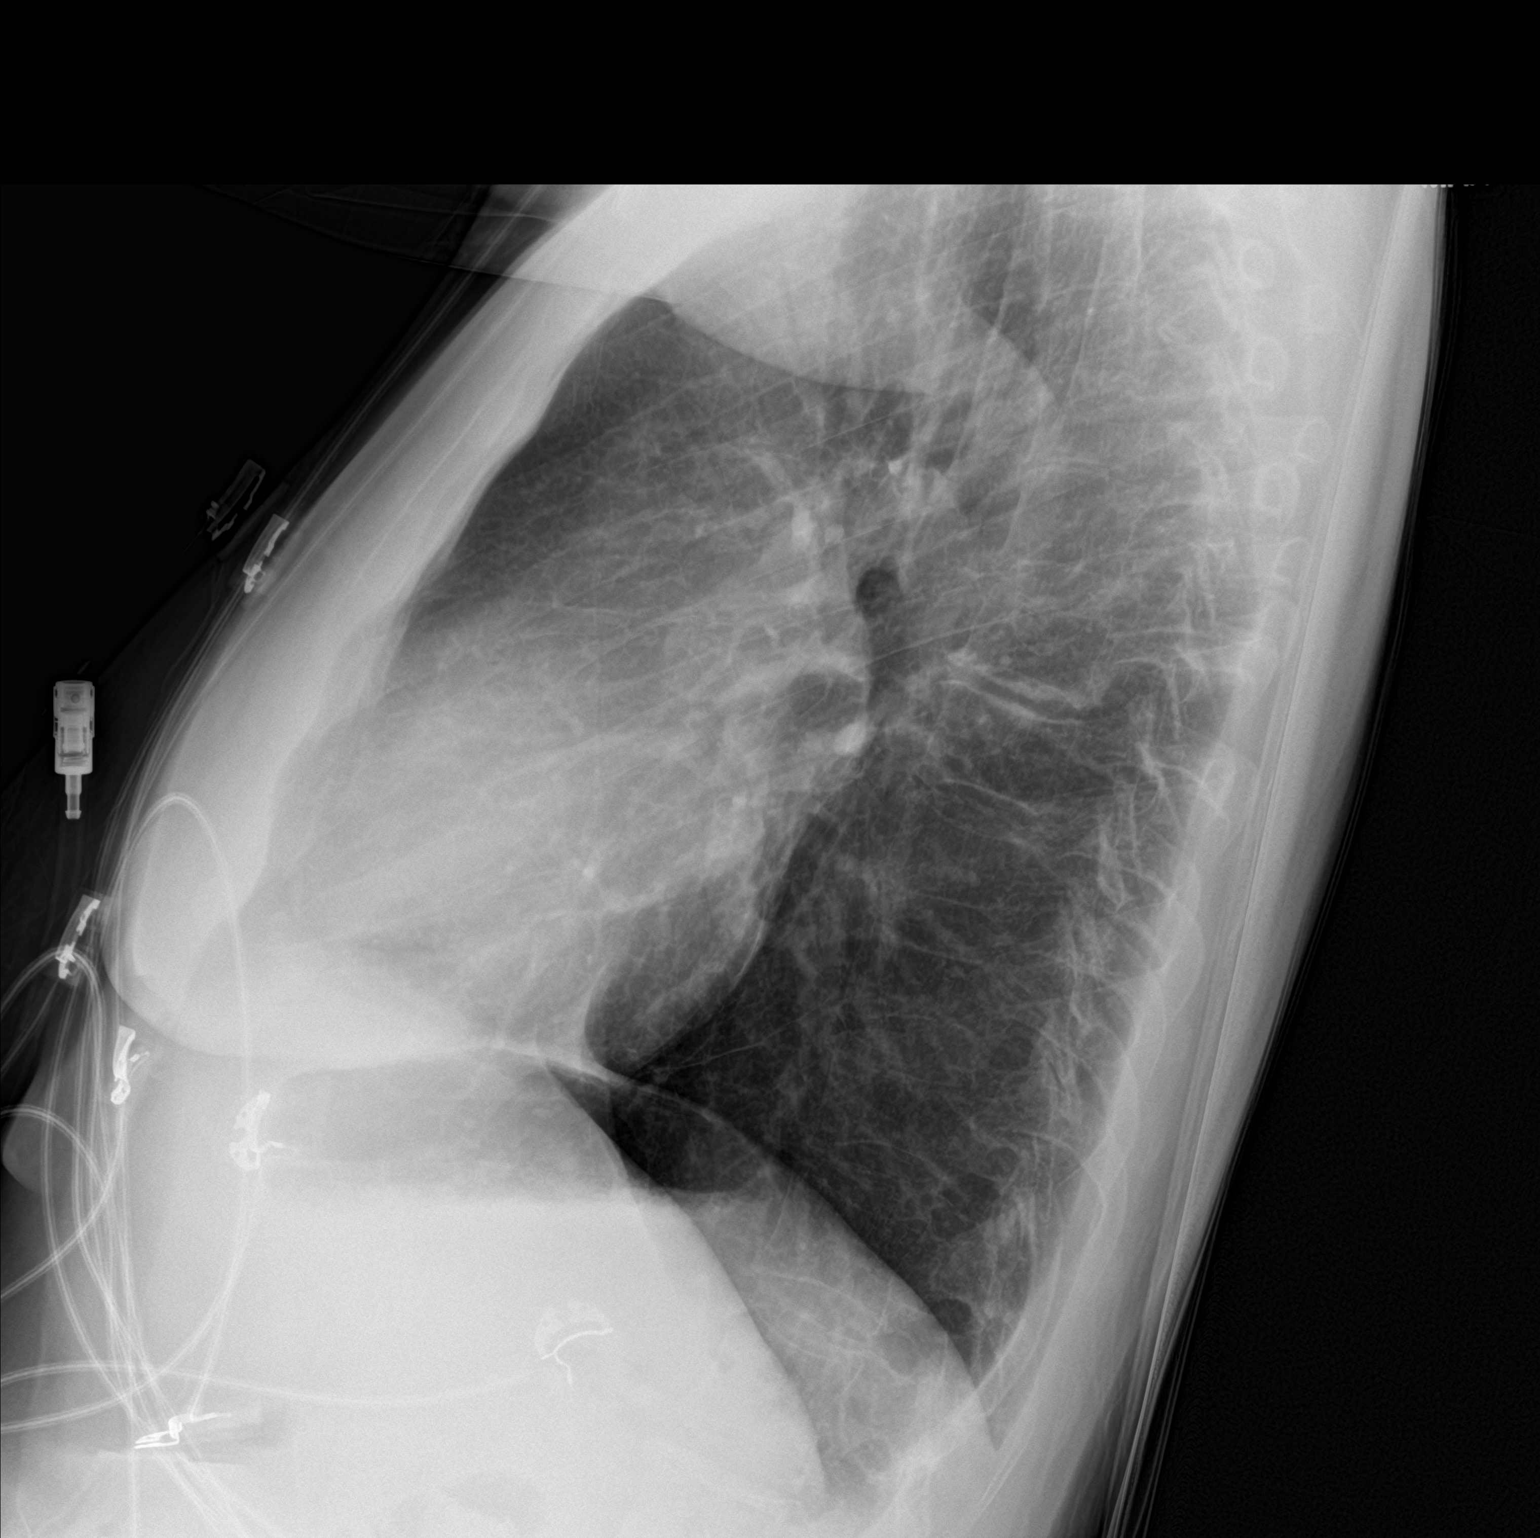

[chest ap]
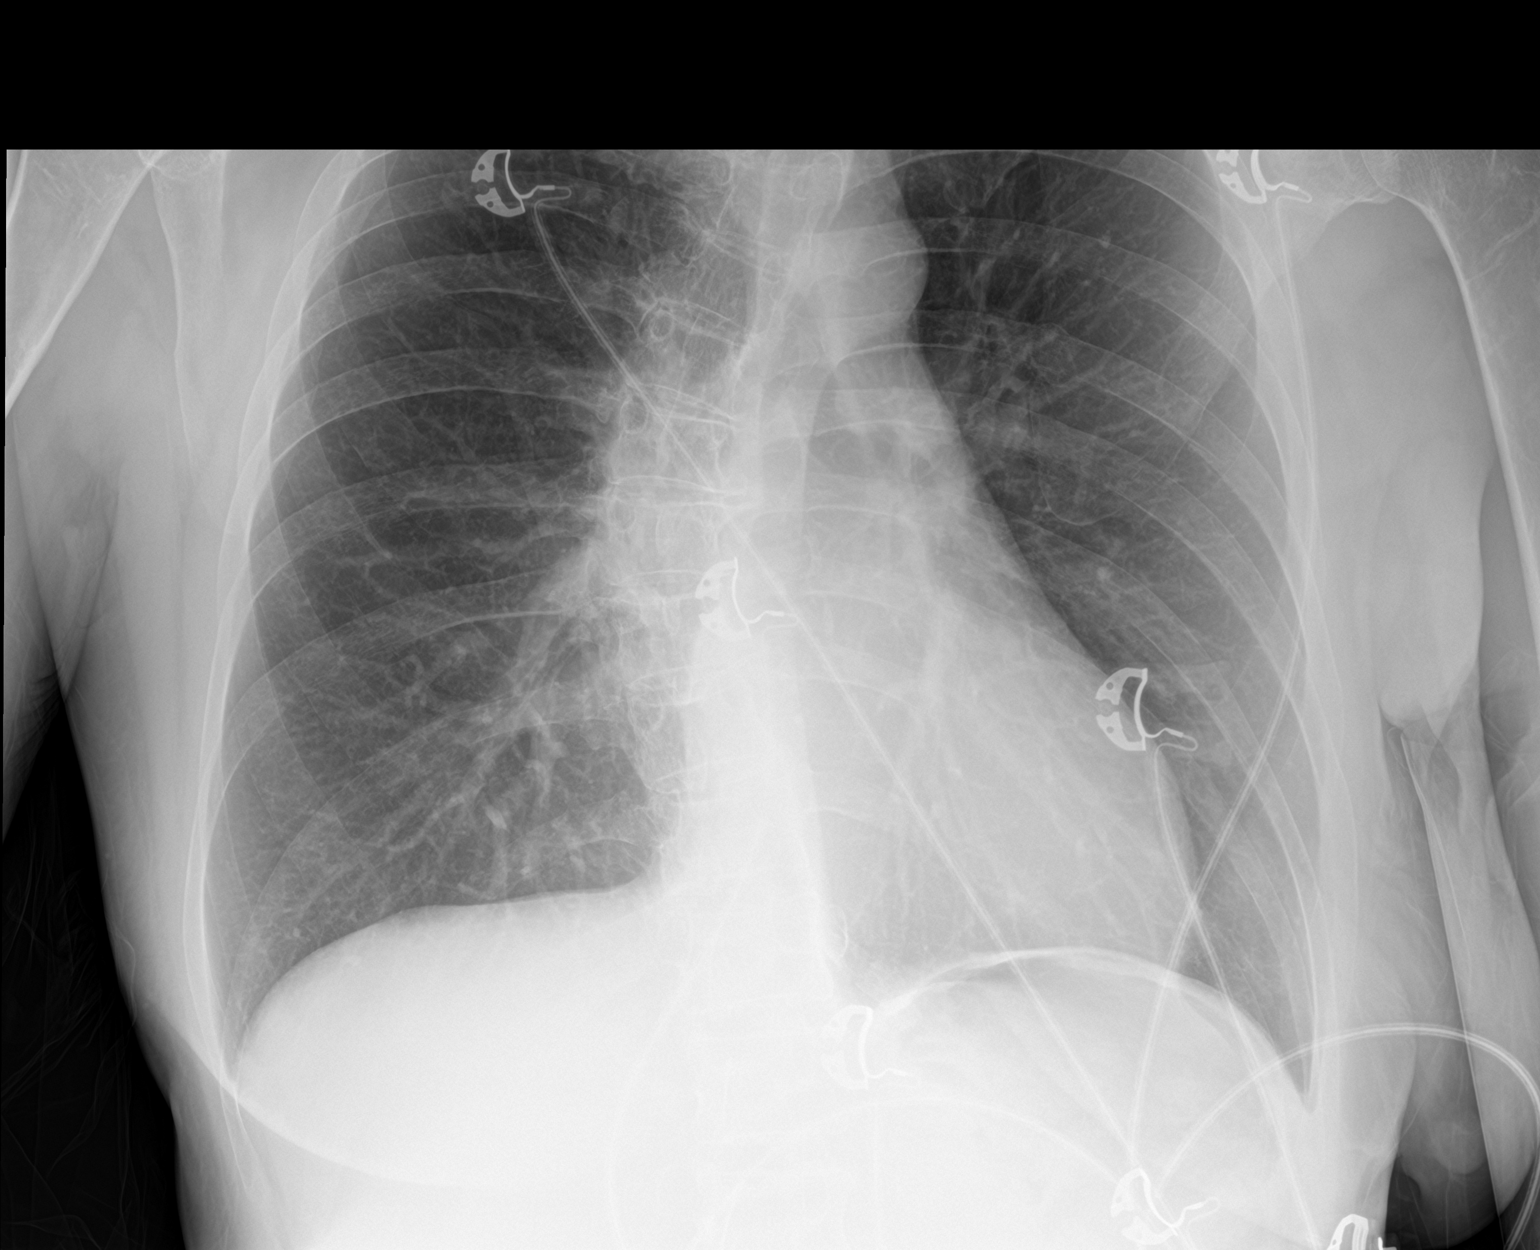

[2 of 2 positions shown; findings below may reference images not displayed]

FINDINGS: Patient is rotated to the left. Heart size is normal. Pulmonary
hyperinflation is noted however both lungs are clear. No evidence of
pleural effusion. Thoracic dextroscoliosis noted.
IMPRESSION: Pulmonary hyperinflation. No active cardiopulmonary disease.

## 2020-08-29 LAB — COMPREHENSIVE METABOLIC PANEL: Calcium: 10.1 (ref 8.7–10.7)

## 2020-08-29 LAB — LIPID PANEL
LDL Cholesterol: 35
Triglycerides: 86 (ref 40–160)

## 2020-10-30 ENCOUNTER — Other Ambulatory Visit: Payer: Self-pay | Admitting: Interventional Cardiology

## 2020-11-21 NOTE — Telephone Encounter (Signed)
She needs an ECG and if similar to prior I would do a 7 day monitor.

## 2020-11-22 NOTE — Telephone Encounter (Signed)
Spoke with pt and she is agreeable to come by tomorrow to have an EKG performed.

## 2020-11-23 ENCOUNTER — Encounter: Payer: Self-pay | Admitting: Interventional Cardiology

## 2020-11-23 ENCOUNTER — Other Ambulatory Visit: Payer: Self-pay

## 2020-11-23 ENCOUNTER — Ambulatory Visit (INDEPENDENT_AMBULATORY_CARE_PROVIDER_SITE_OTHER): Payer: 59 | Admitting: Interventional Cardiology

## 2020-11-23 ENCOUNTER — Ambulatory Visit (INDEPENDENT_AMBULATORY_CARE_PROVIDER_SITE_OTHER): Payer: 59

## 2020-11-23 VITALS — BP 118/72 | HR 91 | Ht 65.0 in | Wt 129.0 lb

## 2020-11-23 DIAGNOSIS — Z720 Tobacco use: Secondary | ICD-10-CM

## 2020-11-23 DIAGNOSIS — R002 Palpitations: Secondary | ICD-10-CM

## 2020-11-23 DIAGNOSIS — I10 Essential (primary) hypertension: Secondary | ICD-10-CM | POA: Diagnosis not present

## 2020-11-23 DIAGNOSIS — I25118 Atherosclerotic heart disease of native coronary artery with other forms of angina pectoris: Secondary | ICD-10-CM

## 2020-11-23 DIAGNOSIS — E118 Type 2 diabetes mellitus with unspecified complications: Secondary | ICD-10-CM

## 2020-11-23 DIAGNOSIS — Z794 Long term (current) use of insulin: Secondary | ICD-10-CM

## 2020-11-23 DIAGNOSIS — E785 Hyperlipidemia, unspecified: Secondary | ICD-10-CM

## 2020-11-23 NOTE — Patient Instructions (Signed)
Medication Instructions:  Your physician recommends that you continue on your current medications as directed. Please refer to the Current Medication list given to you today.  *If you need a refill on your cardiac medications before your next appointment, please call your pharmacy*   Lab Work: None If you have labs (blood work) drawn today and your tests are completely normal, you will receive your results only by: [place] (if you have MyChart) OR A paper copy in the mail If you have any lab test that is abnormal or we need to change your treatment, we will call you to review the results.   Testing/Procedures: Your physician has requested that you have an echocardiogram. Echocardiography is a painless test that uses sound waves to create images of your heart. It provides your doctor with information about the size and shape of your heart and how well your heart's chambers and valves are working. This procedure takes approximately one hour. There are no restrictions for this procedure.  Your physician recommends that you wear a monitor for 14 days.    Other Instructions

## 2020-11-23 NOTE — Progress Notes (Unsigned)
Enrolled patient for a 14 day Zio XT monitor to be mailed to patients home on 9/4. Patient will be out of town until then

## 2020-11-23 NOTE — Progress Notes (Signed)
Cardiology Office Note:    Date:  11/23/2020   ID:  Casey Mason, DOB [birth year], MRN [id number]  PCP:  [name], MD  Cardiologist:  [name], MD   Referring MD: [name], MD   Chief Complaint  Patient presents with   Irregular Heart Beat     History of Present Illness:    Casey Mason is a 55 y.o. female with a hx of DM II, tobacco use, hypertension,  hyperlipidemia, and CAD with anterior STEMI and LAD stent 11/14/2018.    Had COVID 3 months ago.  Has significant shortness of breath.  Since that time she has had intermittent sharp atypical left chest discomfort.  Discomfort is not exertional.  There is also been some left neck and arm discomfort associated.  More concerning however, is palpitations that occur relatively frequently several times per week.  They cause her alarm.  She wanted to be checked out before going on a long plane flight to [place] this coming weekend.  We had her come in for an EKG.  She has not felt the palpitations at all today.  Past Medical History:  Diagnosis Date   Diabetes mellitus without complication ([place])    Diverticulosis of colon    Family history of adverse reaction to anesthesia    mother-- ponv   History of colon polyps    2016- hyperplastic   History of diverticulitis of colon    Nephrolithiasis    right   PONV (postoperative nausea and vomiting)    Prolapse of female bladder, acquired    Right ureteral stone     Past Surgical History:  Procedure Laterality Date   BLADDER SUSPENSION     CESAREAN SECTION  1988   COLONOSCOPY  11-10-2014  in [place] 11/14/2018   Procedure: CORONARY STENT INTERVENTION;  Surgeon: Belva Crome, MD;  Location: [facility];  Service: Cardiovascular;  Laterality: N/A;   CORONARY/GRAFT ACUTE MI REVASCULARIZATION N/A 11/14/2018   Procedure: Coronary/Graft Acute MI Revascularization;  Surgeon: Belva Crome, MD;  Location: [facility];  Service:  Cardiovascular;  Laterality: N/A;   CYSTOSCOPY WITH RETROGRADE PYELOGRAM, URETEROSCOPY AND STENT PLACEMENT Right 02/07/2016   Procedure: CYSTOSCOPY WITH RIGHT PYLEOGRAM  RIGHT URETEROSCOPY, , DIALATION OF RIGHT URETER STRICUTURE FLEXIBLE RIGHT URETEROSCOPY, RIGHT PYELOSCOPY,  AND RIGHT DOUBLE STENT PLACEMENT WITH TETHER;  Surgeon: Carolan Clines, MD;  Location: [facility];  Service: Urology;  Laterality: Right;   EXTRACORPOREAL SHOCK WAVE LITHOTRIPSY Right 09/20/2015   LEFT HEART CATH AND CORONARY ANGIOGRAPHY N/A 11/14/2018   Procedure: LEFT HEART CATH AND CORONARY ANGIOGRAPHY;  Surgeon: Belva Crome, MD;  Location: [facility];  Service: Cardiovascular;  Laterality: N/A;   ROTATOR CUFF REPAIR Right 2013 or 2014??   TONSILLECTOMY  2003   TUBAL LIGATION  1992   VAGINAL HYSTERECTOMY  2005    Current Medications: Current Meds  Medication Sig   atorvastatin (LIPITOR) 40 MG tablet TAKE 1 TABLET BY MOUTH EVERY DAY   Cholecalciferol (VITAMIN D3) 2000 units TABS Take 2 tablets by mouth daily.    Cinnamon 500 MG capsule Take 1,000 mg by mouth daily.    clopidogrel (PLAVIX) 75 MG tablet TAKE 1 TABLET BY MOUTH EVERY DAY   Coenzyme Q10 100 MG capsule Take 100 mg by mouth daily.   COLLAGEN PO Take 1,000 mg by mouth 2 (two) times daily.   Dulaglutide (TRULICITY) 1.5 0000000 SOPN  Inject 1.5 mg into the skin once a week.   empagliflozin (JARDIANCE) 25 MG TABS tablet Take 25 mg by mouth daily.   ibandronate (BONIVA) 150 MG tablet Take 150 mg by mouth every 30 (thirty) days.   magnesium oxide (MAG-OX) 400 MG tablet Take 400 mg by mouth daily.   metFORMIN (GLUCOPHAGE) 500 MG tablet Take 1,000 mg by mouth 2 (two) times daily.   Multiple Vitamin (MULTIVITAMIN) tablet Take 1 tablet by mouth daily.   nitroGLYCERIN (NITROSTAT) 0.4 MG SL tablet Place 1 tablet (0.4 mg total) under the tongue every 5 (five) minutes x 3 doses as needed for chest pain.   vitamin B-12 (CYANOCOBALAMIN) 100  MCG tablet Take 100 mcg by mouth daily.     Allergies:   Diphenhydramine-acetaminophen, Oxycodone, and Sulfa antibiotics   Social History   Socioeconomic History   Marital status: Married    Spouse name: Denyse Amass   Number of children: 2   Years of education: 14   Highest education level: Associate degree: occupational, Hotel manager, or vocational program  Occupational History   Not on file  Tobacco Use   Smoking status: Former    Packs/day: 0.50    Years: 15.00    Pack years: 7.50    Types: Cigarettes    Quit date: 11/14/2018    Years since quitting: 2.0   Smokeless tobacco: Never  Vaping Use   Vaping Use: Never used  Substance and Sexual Activity   Alcohol use: Yes    Alcohol/week: 0.0 standard drinks    Comment: occasional   Drug use: No   Sexual activity: Not on file  Other Topics Concern   Not on file  Social History Narrative   Not on file   Social Determinants of Health   Financial Resource Strain: Not on file  Food Insecurity: Not on file  Transportation Needs: Not on file  Physical Activity: Not on file  Stress: Not on file  Social Connections: Not on file     Family History: The patient's family history includes Anemia in her mother; Emphysema in her mother. There is no history of Stomach cancer or Esophageal cancer.  ROS:   Please see the history of present illness.    Still feels that she is recovering from [place] even though it was several months ago.  She is gradually getting better.  All other systems reviewed and are negative.  EKGs/Labs/Other Studies Reviewed:    The following studies were reviewed today: No new or recent data  EKG:  EKG normal sinus rhythm with normal appearance to the EKG.  Recent Labs: No results found for requested labs within last 8760 hours.  Recent Lipid Panel    Component Value Date/Time   CHOL 79 (L) 01/17/2019 0736   TRIG 53 01/17/2019 0736   HDL 33 (L) 01/17/2019 0736   CHOLHDL 2.4 01/17/2019 0736   CHOLHDL 3.4  11/15/2018 0808   VLDL 18 11/15/2018 0808   LDLCALC 33 01/17/2019 0736    Physical Exam:    VS:  BP 118/72   Pulse 91   Ht '5\' 5"'$  (1.651 m)   Wt 129 lb (58.5 kg)   BMI 21.47 kg/m     Wt Readings from Last 3 Encounters:  11/23/20 129 lb (58.5 kg)  03/12/20 140 lb (63.5 kg)  07/26/19 144 lb 6.4 oz (65.5 kg)     GEN: Has lost weight significantly since last being seen by me.. No acute distress HEENT: Normal NECK: No JVD. LYMPHATICS: No  lymphadenopathy CARDIAC: No murmur. RRR no gallop, or edema. VASCULAR:  Normal Pulses. No bruits. RESPIRATORY:  Clear to auscultation without rales, wheezing or rhonchi  ABDOMEN: Soft, non-tender, non-distended, No pulsatile mass, MUSCULOSKELETAL: No deformity  SKIN: Warm and dry NEUROLOGIC:  Alert and oriented x 3 PSYCHIATRIC:  Normal affect   ASSESSMENT:    1. Palpitations   2. Coronary artery disease of native artery of native heart with stable angina pectoris ([place])   3. Type 2 diabetes mellitus with complication, with long-term current use of insulin ([place])   4. Essential hypertension   5. Hyperlipidemia with target LDL less than 70   6. Tobacco abuse    PLAN:    In order of problems listed above:  Rule out atrial fibrillation with 2-week monitor Get 2D Doppler echocardiogram to rule out wall motion abnormality and preserved LV function.  This will help ensure that there was not myocardial damage during her COVID infection. Continue to manage Blood pressure control is currently excellent on therapy. Target LDL less than 70 Smoking cessation has occurred since COVID-19 infection.  Overall education and awareness concerning primary/secondary risk prevention was discussed in detail: LDL less than 70, hemoglobin A1c less than 7, blood pressure target less than 130/80 mmHg, >150 minutes of moderate aerobic activity per week, avoidance of smoking, weight control (via diet and exercise), and continued surveillance/management of/for  obstructive sleep apnea.    Medication Adjustments/Labs and Tests Ordered: Current medicines are reviewed at length with the patient today.  Concerns regarding medicines are outlined above.  Orders Placed This Encounter  Procedures   LONG TERM MONITOR (3-14 DAYS)   ECHOCARDIOGRAM COMPLETE   No orders of the defined types were placed in this encounter.   Patient Instructions  Medication Instructions:  Your physician recommends that you continue on your current medications as directed. Please refer to the Current Medication list given to you today.  *If you need a refill on your cardiac medications before your next appointment, please call your pharmacy*   Lab Work: None If you have labs (blood work) drawn today and your tests are completely normal, you will receive your results only by: [facility] (if you have MyChart) OR A paper copy in the mail If you have any lab test that is abnormal or we need to change your treatment, we will call you to review the results.   Testing/Procedures: Your physician has requested that you have an echocardiogram. Echocardiography is a painless test that uses sound waves to create images of your heart. It provides your doctor with information about the size and shape of your heart and how well your heart's chambers and valves are working. This procedure takes approximately one hour. There are no restrictions for this procedure.  Your physician recommends that you wear a monitor for 14 days.    Other Instructions     Signed, [name], MD  11/23/2020 3:41 PM    [facility]

## 2020-11-27 NOTE — Addendum Note (Signed)
Addended by: Loren Racer on: 11/27/2020 02:44 PM   Modules accepted: Orders

## 2020-12-06 DIAGNOSIS — R002 Palpitations: Secondary | ICD-10-CM | POA: Diagnosis not present

## 2020-12-14 ENCOUNTER — Other Ambulatory Visit (HOSPITAL_COMMUNITY): Payer: 59

## 2020-12-24 ENCOUNTER — Ambulatory Visit (HOSPITAL_COMMUNITY): Payer: 59 | Attending: Cardiology

## 2020-12-24 ENCOUNTER — Telehealth: Payer: Self-pay | Admitting: Interventional Cardiology

## 2020-12-24 ENCOUNTER — Other Ambulatory Visit: Payer: Self-pay

## 2020-12-24 DIAGNOSIS — R002 Palpitations: Secondary | ICD-10-CM | POA: Diagnosis present

## 2020-12-24 LAB — ECHOCARDIOGRAM COMPLETE
Area-P 1/2: 3.08 cm2
S' Lateral: 3 cm

## 2020-12-24 NOTE — Telephone Encounter (Signed)
Pt in today to have echo.  She was recently prescribed Actos 15mg  QD by her Endocrinologist.  Her Pharmacist told her to check with her Cardiologist before starting this medication.  Advised I will send this to our Pharmacy team and get their thoughts and give her a call.

## 2020-12-24 NOTE — Telephone Encounter (Signed)
Spoke with pt and made her aware of information from Pharmacist.  Pt appreciative for call.

## 2020-12-24 NOTE — Telephone Encounter (Addendum)
Pt already taking metformin 1000mg  BID, Jardiance 25mg  daily, and Trulicity for her DM according to our med list. Most recent endo note from 9/22 states pt is taking Trulicity 3mg  weekly (not 1.5mg  weekly as our med list states - will need to be updated). A1c 8.2% last week.  Biggest concern with starting pioglitazone is in patients with HF. She did have systolic HF added to her problem list in the past but per recent cards notes, no clinical evidence of HF. Updated echo actually checked today, LVEF 60-65% with no signs of CHF. Can interact with her Plavix too (levels of pioglitazone would be increased - not a huge issue since she's being prescribed the lowest starting dose). Ok to start pioglitazone as recommended by her endocrinologist.

## 2020-12-26 NOTE — Telephone Encounter (Signed)
Message sent through my chart.  Per DPR it is OK to leave a message on voicemail.  Left message to call office if any questions.

## 2021-03-11 ENCOUNTER — Other Ambulatory Visit: Payer: Self-pay | Admitting: Interventional Cardiology

## 2021-03-12 LAB — BASIC METABOLIC PANEL
BUN: 19 (ref 4–21)
Creatinine: 0.6 (ref 0.5–1.1)

## 2021-03-13 LAB — COMPREHENSIVE METABOLIC PANEL
Calcium: 10.4 (ref 8.7–10.7)
GFR calc non Af Amer: 105

## 2021-03-13 LAB — HEMOGLOBIN A1C: Hemoglobin A1C: 8.1

## 2021-03-20 ENCOUNTER — Other Ambulatory Visit: Payer: Self-pay | Admitting: Interventional Cardiology

## 2021-04-17 ENCOUNTER — Encounter: Payer: Self-pay | Admitting: Nurse Practitioner

## 2021-04-17 ENCOUNTER — Ambulatory Visit: Payer: 59 | Admitting: Nurse Practitioner

## 2021-04-17 ENCOUNTER — Other Ambulatory Visit: Payer: Self-pay

## 2021-04-17 VITALS — BP 91/64 | HR 100 | Ht 64.5 in | Wt 122.2 lb

## 2021-04-17 DIAGNOSIS — E119 Type 2 diabetes mellitus without complications: Secondary | ICD-10-CM

## 2021-04-17 DIAGNOSIS — E782 Mixed hyperlipidemia: Secondary | ICD-10-CM | POA: Diagnosis not present

## 2021-04-17 NOTE — Progress Notes (Signed)
Endocrinology Consult Note       04/17/2021, 5:16 PM   Subjective:    Patient ID: Casey Mason, female    DOB: [birth year].  Casey Mason is being seen in consultation for management of currently uncontrolled symptomatic diabetes requested by  [name], MD.   Past Medical History:  Diagnosis Date   Diabetes mellitus without complication [facility])    Diverticulosis of colon    Family history of adverse reaction to anesthesia    mother-- ponv   History of colon polyps    2016- hyperplastic   History of diverticulitis of colon    Nephrolithiasis    right   PONV (postoperative nausea and vomiting)    Prolapse of female bladder, acquired    Right ureteral stone     Past Surgical History:  Procedure Laterality Date   [place]   COLONOSCOPY  11-10-2014  in [place] 11/14/2018   Procedure: CORONARY STENT INTERVENTION;  Surgeon: Belva Crome, MD;  Location: [place] [facility];  Service: Cardiovascular;  Laterality: N/A;   CORONARY/GRAFT ACUTE MI REVASCULARIZATION N/A 11/14/2018   Procedure: Coronary/Graft Acute MI Revascularization;  Surgeon: Belva Crome, MD;  Location: [place] [facility];  Service: Cardiovascular;  Laterality: N/A;   CYSTOSCOPY WITH RETROGRADE PYELOGRAM, URETEROSCOPY AND STENT PLACEMENT Right 02/07/2016   Procedure: CYSTOSCOPY WITH RIGHT PYLEOGRAM  RIGHT URETEROSCOPY, , DIALATION OF RIGHT URETER STRICUTURE FLEXIBLE RIGHT URETEROSCOPY, RIGHT PYELOSCOPY,  AND RIGHT DOUBLE STENT PLACEMENT WITH TETHER;  Surgeon: Carolan Clines, MD;  Location: [place];  Service: Urology;  Laterality: Right;   EXTRACORPOREAL SHOCK WAVE LITHOTRIPSY Right 09/20/2015   LEFT HEART CATH AND CORONARY ANGIOGRAPHY N/A 11/14/2018   Procedure: LEFT HEART CATH AND CORONARY ANGIOGRAPHY;  Surgeon: Belva Crome, MD;  Location: [place] [facility];  Service: Cardiovascular;  Laterality: N/A;   ROTATOR CUFF REPAIR Right 2013 or 2014??   TONSILLECTOMY  2003   TUBAL LIGATION  1992   VAGINAL HYSTERECTOMY  2005    Social History   Socioeconomic History   Marital status: Married    Spouse name: Denyse Amass   Number of children: 2   Years of education: 14   Highest education level: Associate degree: occupational, Hotel manager, or vocational program  Occupational History   Not on file  Tobacco Use   Smoking status: Former    Packs/day: 0.50    Years: 15.00    Pack years: 7.50    Types: Cigarettes    Quit date: 11/14/2018    Years since quitting: 2.4   Smokeless tobacco: Never  Vaping Use   Vaping Use: Never used  Substance and Sexual Activity   Alcohol use: Yes    Alcohol/week: 0.0 standard drinks    Comment: occasional   Drug use: No   Sexual activity: Not on file  Other Topics Concern   Not on file  Social History Narrative   Not on file   Social Determinants of Health   Financial Resource Strain: Not on file  Food Insecurity: Not on file  Transportation Needs: Not on file  Physical  Activity: Not on file  Stress: Not on file  Social Connections: Not on file    Family History  Problem Relation Age of Onset   Anemia Mother    Emphysema Mother    Osteoporosis Mother    Stomach cancer Neg Hx    Esophageal cancer Neg Hx     Outpatient Encounter Medications as of 04/17/2021  Medication Sig   atorvastatin (LIPITOR) 40 MG tablet Take 1 tablet (40 mg total) by mouth daily. Please schedule appointment for future refills. Thank you   Cholecalciferol (VITAMIN D3) 2000 units TABS Take 2 tablets by mouth daily.    Cinnamon 500 MG capsule Take 1,000 mg by mouth daily.    clopidogrel (PLAVIX) 75 MG tablet TAKE 1 TABLET BY MOUTH EVERY DAY   Coenzyme Q10 100 MG capsule Take 100 mg by mouth daily.   empagliflozin (JARDIANCE) 25 MG TABS tablet Take 25 mg by mouth daily.   ibandronate (BONIVA) 150 MG tablet Take 150 mg by mouth  every 30 (thirty) days.   Lancets (ONETOUCH DELICA PLUS [id number]) MISC 2 (two) times daily Use as instructed.   metFORMIN (GLUCOPHAGE) 500 MG tablet Take 1,000 mg by mouth daily after breakfast.   Multiple Vitamin (MULTIVITAMIN) tablet Take 1 tablet by mouth daily.   nitroGLYCERIN (NITROSTAT) 0.4 MG SL tablet PLACE 1 TABLET (0.4 MG TOTAL) UNDER THE TONGUE EVERY 5 (FIVE) MINUTES X 3 DOSES AS NEEDED FOR CHEST PAIN.   ONETOUCH ULTRA test strip TEST TWICE DAILY N82.95   TRULICITY 4.5 AO/1.3YQ SOPN Inject into the skin.   vitamin B-12 (CYANOCOBALAMIN) 100 MCG tablet Take 100 mcg by mouth daily.   [DISCONTINUED] COLLAGEN PO Take 1,000 mg by mouth 2 (two) times daily.   [DISCONTINUED] Dulaglutide (TRULICITY) 1.5 MV/7.8IO SOPN Inject 1.5 mg into the skin once a week. (Patient not taking: Reported on 04/17/2021)   [DISCONTINUED] magnesium oxide (MAG-OX) 400 MG tablet Take 400 mg by mouth daily.   No facility-administered encounter medications on file as of 04/17/2021.    ALLERGIES: Allergies  Allergen Reactions   Diphenhydramine-Acetaminophen Hives   Oxycodone Itching   Sulfa Antibiotics Itching   Sulfur     Other reaction(s): Unknown    VACCINATION STATUS: Immunization History  Administered Date(s) Administered   PFIZER(Purple Top)SARS-COV-2 Vaccination 12/29/2019    Diabetes She presents for her initial diabetic visit. She has type 2 diabetes mellitus. Onset time: diagnosed at approx age of 66. Her disease course has been fluctuating. Hypoglycemia symptoms include tremors. There are no diabetic associated symptoms. There are no hypoglycemic complications. Diabetic complications include heart disease. Risk factors for coronary artery disease include diabetes mellitus, dyslipidemia, family history, hypertension and tobacco exposure. Current diabetic treatment includes oral agent (triple therapy) (between PCP and other endocrinologist, she was on Metformin 1000 mg ER BID, Jardiance 25, Trulicity  4.5 weekly, and Actos (which she never started taking)). She is compliant with treatment most of the time. Her weight is decreasing steadily. She is following a generally healthy diet. Meal planning includes avoidance of concentrated sweets. She has had a previous visit with a dietitian (has seen dietician in the past). She participates in exercise intermittently. (She presents today for her consultation with her meter, no logs, showing slightly above target glycemic profile.  Her most recent A1c on 03/12/21 was 8.1%.  She is frustrated that she has not been able to maintain control of her diabetes despite eating relatively healthy.  She monitors glucose only once daily.  She drinks water with  SF additives and eats about 6 small meals per day.  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  She is UTD on eye exam and has never seen a podiatrist before.  She has seen an endocrinologist at [facility] who recently made some medication changed which she was hesitant to do.  Currently, she is on Metformin 1000 mg ER po twice daily, Jardiance 25 mg po daily, Trulicity 4.5 mg SQ weekly (recently increased), and was prescribed Actos which she never took due to her concerns about the side effects.  Analysis of her meter shows 7-day average of 148, 14-day average of 146, 30-day average of 145.) An ACE inhibitor/angiotensin II receptor blocker is not being taken. She does not see a podiatrist.Eye exam is current.  Hyperlipidemia This is a chronic problem. The current episode started more than 1 year ago. The problem is controlled. Recent lipid tests were reviewed and are normal. Exacerbating diseases include diabetes. There are no known factors aggravating her hyperlipidemia. Current antihyperlipidemic treatment includes statins. The current treatment provides moderate improvement of lipids. There are no compliance problems.  Risk factors for coronary  artery disease include diabetes mellitus, dyslipidemia and family history.    Review of systems  Constitutional: + steadily decreasing body weight, current Body mass index is 20.65 kg/m., no fatigue, no subjective hyperthermia, no subjective hypothermia Eyes: no blurry vision, no xerophthalmia ENT: no sore throat, no nodules palpated in throat, no dysphagia/odynophagia, no hoarseness Cardiovascular: no chest pain, no shortness of breath, no palpitations, no leg swelling Respiratory: no cough, no shortness of breath Gastrointestinal: no nausea/vomiting/diarrhea Musculoskeletal: no muscle/joint aches Skin: no rashes, no hyperemia Neurological: no tremors, no numbness, no tingling, no dizziness Psychiatric: no depression, no anxiety  Objective:     BP 91/64    Pulse 100    Ht 5' 4.5" (1.638 m)    Wt 122 lb 3.2 oz (55.4 kg)    SpO2 96%    BMI 20.65 kg/m   Wt Readings from Last 3 Encounters:  04/17/21 122 lb 3.2 oz (55.4 kg)  11/23/20 129 lb (58.5 kg)  03/12/20 140 lb (63.5 kg)     BP Readings from Last 3 Encounters:  04/17/21 91/64  11/23/20 118/72  03/12/20 112/66     Physical Exam- Limited  Constitutional:  Body mass index is 20.65 kg/m. , not in acute distress, normal state of mind Eyes:  EOMI, no exophthalmos Neck: Supple Cardiovascular: RRR, no murmurs, rubs, or gallops, no edema Respiratory: Adequate breathing efforts, no crackles, rales, rhonchi, or wheezing Musculoskeletal: no gross deformities, strength intact in all four extremities, no gross restriction of joint movements Skin:  no rashes, no hyperemia Neurological: no tremor with outstretched hands    CMP ( most recent) CMP     Component Value Date/Time   NA 139 11/16/2018 0035   K 3.9 11/16/2018 0035   CL 108 11/16/2018 0035   CO2 24 11/16/2018 0035   GLUCOSE 185 (H) 11/16/2018 0035   BUN 19 03/12/2021 0000   CREATININE 0.6 03/12/2021 0000   CREATININE 0.74 11/16/2018 0035   CALCIUM 10.4 03/12/2021  0000   PROT 6.5 01/17/2019 0736   ALBUMIN 4.5 01/17/2019 0736   AST 25 01/17/2019 0736   ALT 32 01/17/2019 0736   ALKPHOS 82 01/17/2019 0736   BILITOT 0.3 01/17/2019 0736   GFRNONAA 105 03/12/2021 0000   GFRAA >60 11/16/2018 0035  Diabetic Labs (most recent): Lab Results  Component Value Date   HGBA1C 8.1 03/12/2021   HGBA1C 6.0 (H) 11/15/2018     Lipid Panel ( most recent) Lipid Panel     Component Value Date/Time   CHOL 79 (L) 01/17/2019 0736   TRIG 86 08/29/2020 0000   HDL 33 (L) 01/17/2019 0736   CHOLHDL 2.4 01/17/2019 0736   CHOLHDL 3.4 11/15/2018 0808   VLDL 18 11/15/2018 0808   LDLCALC 35 08/29/2020 0000   LDLCALC 33 01/17/2019 0736   LABVLDL 13 01/17/2019 0736      No results found for: TSH, FREET4         Assessment & Plan:   1) Type 2 Diabetes without complications  She presents today for her consultation with her meter, no logs, showing slightly above target glycemic profile.  Her most recent A1c on 03/12/21 was 8.1%.  She is frustrated that she has not been able to maintain control of her diabetes despite eating relatively healthy.  She monitors glucose only once daily.  She drinks water with SF additives and eats about 6 small meals per day.  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  She is UTD on eye exam and has never seen a podiatrist before.  She has [facility] an [facility] who recently made some medication changed which she was hesitant to do.  Currently, she is on Metformin 1000 mg ER po twice daily, Jardiance 25 mg po daily, Trulicity 4.5 mg SQ weekly (recently increased), and was prescribed Actos which she never took due to her concerns about the side effects.  Analysis of her meter shows 7-day average of 148, 14-day average of 146, 30-day average of 145.  - Casey Mason has currently uncontrolled symptomatic type 2 DM since 56 years of age, with most  recent A1c of 8.1 %.   -Recent labs reviewed.  - I had a long discussion with her about the progressive nature of diabetes and the pathology behind its complications. -her diabetes is complicated by CAD with MI and she remains at a high risk for more acute and chronic complications which include CAD, CVA, CKD, retinopathy, and neuropathy. These are all discussed in detail with her.  The following Lifestyle Medicine recommendations according to [facility]) were discussed and offered to patient and she agrees to start the journey:  A. Whole Foods, Plant-based plate comprising of fruits and vegetables, plant-based proteins, whole-grain carbohydrates was discussed in detail with the patient.   A list for source of those nutrients were also provided to the patient.  Patient will use only water or unsweetened tea for hydration. B.  The need to stay away from risky substances including alcohol, smoking; obtaining 7 to 9 hours of restorative sleep, at least 150 minutes of moderate intensity exercise weekly, the importance of healthy social connections,  and stress reduction techniques were discussed. C.  A full color page of  Calorie density of various food groups per pound showing examples of each food groups was provided to the patient.  - I have counseled her on diet and weight management by adopting a carbohydrate restricted/protein rich diet. Patient is encouraged to switch to unprocessed or minimally processed complex starch and increased protein intake (animal or plant source), fruits, and vegetables. -  she is advised to stick to a routine mealtimes to eat 3 meals  a day and avoid unnecessary snacks (to snack only to correct hypoglycemia).   - she acknowledges that there is a room for improvement in her food and drink choices. - Suggestion is made for her to avoid simple carbohydrates from her diet including Cakes, Sweet Desserts, Ice Cream, Soda (diet and regular),  Sweet Tea, Candies, Chips, Cookies, Store Bought Juices, Alcohol in Excess of 1-2 drinks a day, Artificial Sweeteners, Coffee Creamer, and "Sugar-free" Products. This will help patient to have more stable blood glucose profile and potentially avoid unintended weight gain.  - she will be scheduled with Jearld Fenton, RDN, CDE for diabetes education.  - I have approached her with the following individualized plan to manage her diabetes and patient agrees:   -she is encouraged to start monitoring glucose 4 times daily, before meals and before bed, to log their readings on the clinic sheets provided, and bring them to review at follow up appointment in 2 weeks.  I did give her a sample Dexcom today so she can see how certain foods affect her glucose and to provide me with more detailed glucose pattern.  - Adjustment parameters are given to her for hypo and hyperglycemia in writing. - she is encouraged to call clinic for blood glucose levels less than 70 or above 300 mg /dl. - she is advised to continue Metformin but to lower the dose to 1000 mg ER daily with breakfast, therapeutically suitable for patient.  She can also continue her Jardiance 25 mg po daily for now although she is not the best candidate due to her body habitus since Vania Rea is known to reduce weight and her BMI is 20.  - her Trulicity 4.5 mg will be discontinued, risk outweighs benefit for this patient.  - Specific targets for  A1c; LDL, HDL, and Triglycerides were discussed with the patient.  2) Blood Pressure /Hypertension:  her blood pressure is controlled to target without the use of antihypertensive medications.    3) Lipids/Hyperlipidemia:    Review of her recent lipid panel from 08/29/20 showed controlled LDL at 35 .  she is advised to continue Lipitor 40 mg daily at bedtime.  Side effects and precautions discussed with her.  4)  Weight/Diet:  her Body mass index is 20.65 kg/m.  -    she is NOT a candidate for weight  loss. Exercise, and detailed carbohydrates information provided  -  detailed on discharge instructions.  5) Hypercalcemia: She is known to have several instances of elevated blood calcium levels.  She also has history of osteoporosis.  She is on calcium supplement and eats calcium rich foods routinely.  She may benefit from further work up to rule out parathyroid involvement.  We discussed this today.  6) Chronic Care/Health Maintenance: -she is not on ACEI/ARB and is on Statin medications and is encouraged to initiate and continue to follow up with Ophthalmology, Dentist, Podiatrist at least yearly or according to recommendations, and advised to stay away from smoking (quit a very long time ago). I have recommended yearly flu vaccine and pneumonia vaccine at least every 5 years; moderate intensity exercise for up to 150 minutes weekly; and sleep for at least 7 hours a day.  - she is advised to maintain close follow up with [name], MD for primary care needs, as well as her other providers for optimal and coordinated care.   - Time spent in this patient care: 60 min, of which > 50% was spent in counseling her about her  diabetes and the rest reviewing her blood glucose logs, discussing her hypoglycemia and hyperglycemia episodes, reviewing her current and previous labs/studies (including abstraction from other facilities) and medications doses and developing a long term treatment plan based on the latest standards of care/guidelines; and documenting her care.    Please refer to Patient Instructions for Blood Glucose Monitoring and Insulin/Medications Dosing Guide" in media tab for additional information. Please also refer to "Patient Self Inventory" in the Media tab for reviewed elements of pertinent patient history.  Calton Dach participated in the discussions, expressed understanding, and voiced agreement with the above plans.  All questions were answered to her satisfaction. she is  encouraged to contact clinic should she have any questions or concerns prior to her return visit.     Follow up plan: - Return in about 2 weeks (around 05/01/2021) for Diabetes F/U, Bring meter and logs.    Rayetta Pigg, [facility] [address] [place], [place] [address] Phone: [phone] Fax: [phone]  04/17/2021, 5:16 PM

## 2021-04-17 NOTE — Patient Instructions (Signed)

## 2021-05-01 ENCOUNTER — Ambulatory Visit: Payer: 59 | Admitting: Nurse Practitioner

## 2021-05-01 ENCOUNTER — Encounter: Payer: Self-pay | Admitting: Nurse Practitioner

## 2021-05-01 ENCOUNTER — Other Ambulatory Visit: Payer: Self-pay

## 2021-05-01 VITALS — BP 118/70 | HR 77 | Ht 64.5 in | Wt 127.4 lb

## 2021-05-01 DIAGNOSIS — E782 Mixed hyperlipidemia: Secondary | ICD-10-CM

## 2021-05-01 DIAGNOSIS — E119 Type 2 diabetes mellitus without complications: Secondary | ICD-10-CM | POA: Diagnosis not present

## 2021-05-01 LAB — POCT UA - MICROALBUMIN
Albumin/Creatinine Ratio, Urine, POC: <30
Creatinine, POC: 50 mg/dL
Microalbumin Ur, POC: 10 mg/L

## 2021-05-01 NOTE — Progress Notes (Signed)
Endocrinology Follow Up Note       05/01/2021, 3:09 PM   Subjective:    Patient ID: LAVRA IMLER, female    DOB: June 26, 1965.  NYESHA CLIFF is being seen in follow up after being seen in consultation for management of currently uncontrolled symptomatic diabetes requested by  [name], MD.   Past Medical History:  Diagnosis Date   Diabetes mellitus without complication [facility])    Diverticulosis of colon    Family history of adverse reaction to anesthesia    mother-- ponv   History of colon polyps    2016- hyperplastic   History of diverticulitis of colon    Nephrolithiasis    right   PONV (postoperative nausea and vomiting)    Prolapse of female bladder, acquired    Right ureteral stone     Past Surgical History:  Procedure Laterality Date   [place]   COLONOSCOPY  11-10-2014  in [place] 11/14/2018   Procedure: CORONARY STENT INTERVENTION;  Surgeon: Belva Crome, MD;  Location: [place] [facility];  Service: Cardiovascular;  Laterality: N/A;   CORONARY/GRAFT ACUTE MI REVASCULARIZATION N/A 11/14/2018   Procedure: Coronary/Graft Acute MI Revascularization;  Surgeon: Belva Crome, MD;  Location: [place] [facility];  Service: Cardiovascular;  Laterality: N/A;   CYSTOSCOPY WITH RETROGRADE PYELOGRAM, URETEROSCOPY AND STENT PLACEMENT Right 02/07/2016   Procedure: CYSTOSCOPY WITH RIGHT PYLEOGRAM  RIGHT URETEROSCOPY, , DIALATION OF RIGHT URETER STRICUTURE FLEXIBLE RIGHT URETEROSCOPY, RIGHT PYELOSCOPY,  AND RIGHT DOUBLE STENT PLACEMENT WITH TETHER;  Surgeon: Carolan Clines, MD;  Location: [place];  Service: Urology;  Laterality: Right;   EXTRACORPOREAL SHOCK WAVE LITHOTRIPSY Right 09/20/2015   LEFT HEART CATH AND CORONARY ANGIOGRAPHY N/A 11/14/2018   Procedure: LEFT HEART CATH AND CORONARY ANGIOGRAPHY;  Surgeon: Belva Crome,  MD;  Location: [place] [facility];  Service: Cardiovascular;  Laterality: N/A;   ROTATOR CUFF REPAIR Right 2013 or 2014??   TONSILLECTOMY  2003   TUBAL LIGATION  1992   VAGINAL HYSTERECTOMY  2005    Social History   Socioeconomic History   Marital status: Married    Spouse name: Denyse Amass   Number of children: 2   Years of education: 14   Highest education level: Associate degree: occupational, Hotel manager, or vocational program  Occupational History   Not on file  Tobacco Use   Smoking status: Former    Packs/day: 0.50    Years: 15.00    Pack years: 7.50    Types: Cigarettes    Quit date: 11/14/2018    Years since quitting: 2.4   Smokeless tobacco: Never  Vaping Use   Vaping Use: Never used  Substance and Sexual Activity   Alcohol use: Yes    Alcohol/week: 0.0 standard drinks    Comment: occasional   Drug use: No   Sexual activity: Not on file  Other Topics Concern   Not on file  Social History Narrative   Not on file   Social Determinants of Health   Financial Resource Strain: Not on file  Food Insecurity: Not on file  Transportation Needs: Not on file  Physical Activity: Not on file  Stress: Not on file  Social Connections: Not on file    Family History  Problem Relation Age of Onset   Anemia Mother    Emphysema Mother    Osteoporosis Mother    Stomach cancer Neg Hx    Esophageal cancer Neg Hx     Outpatient Encounter Medications as of 05/01/2021  Medication Sig   atorvastatin (LIPITOR) 40 MG tablet Take 1 tablet (40 mg total) by mouth daily. Please schedule appointment for future refills. Thank you   Cholecalciferol (VITAMIN D3) 2000 units TABS Take 2 tablets by mouth daily.    Cinnamon 500 MG capsule Take 1,000 mg by mouth daily.    clopidogrel (PLAVIX) 75 MG tablet TAKE 1 TABLET BY MOUTH EVERY DAY   Coenzyme Q10 100 MG capsule Take 100 mg by mouth daily.   empagliflozin (JARDIANCE) 25 MG TABS tablet Take 25 mg by mouth daily.   ibandronate (BONIVA) 150  MG tablet Take 150 mg by mouth every 30 (thirty) days.   Lancets (ONETOUCH DELICA PLUS [id number]) MISC 2 (two) times daily Use as instructed.   metFORMIN (GLUCOPHAGE) 500 MG tablet Take 1,000 mg by mouth daily after breakfast.   Multiple Vitamin (MULTIVITAMIN) tablet Take 1 tablet by mouth daily.   nitroGLYCERIN (NITROSTAT) 0.4 MG SL tablet PLACE 1 TABLET (0.4 MG TOTAL) UNDER THE TONGUE EVERY 5 (FIVE) MINUTES X 3 DOSES AS NEEDED FOR CHEST PAIN.   ONETOUCH ULTRA test strip TEST TWICE DAILY E11.65   vitamin B-12 (CYANOCOBALAMIN) 100 MCG tablet Take 100 mcg by mouth daily.   [DISCONTINUED] TRULICITY 4.5 VH/8.4ON SOPN Inject into the skin. (Patient not taking: Reported on 05/01/2021)   No facility-administered encounter medications on file as of 05/01/2021.    ALLERGIES: Allergies  Allergen Reactions   Diphenhydramine-Acetaminophen Hives   Oxycodone Itching   Sulfa Antibiotics Itching   Sulfur     Other reaction(s): Unknown    VACCINATION STATUS: Immunization History  Administered Date(s) Administered   PFIZER(Purple Top)SARS-COV-2 Vaccination 12/29/2019    Diabetes She presents for her follow-up diabetic visit. She has type 2 diabetes mellitus. Onset time: diagnosed at approx age of 56. Her disease course has been improving. There are no hypoglycemic associated symptoms. There are no diabetic associated symptoms. There are no hypoglycemic complications. Symptoms are improving. Diabetic complications include heart disease. Risk factors for coronary artery disease include diabetes mellitus, dyslipidemia, family history, hypertension and tobacco exposure. Current diabetic treatment includes oral agent (dual therapy). She is compliant with treatment most of the time. Her weight is stable. She is following a generally healthy diet. Meal planning includes avoidance of concentrated sweets. She has had a previous visit with a dietitian (has seen dietician in the past). She participates in exercise  intermittently. Her home blood glucose trend is decreasing steadily. (She presents today with her meter, no logs, showing slowly improved glycemic profile overall.  She was not due for another A1c today.  She is still adjusting to her new meal and medication regimen but overall feels much better.  She notes that she has more energy now.  She denies any hypoglycemia.) An ACE inhibitor/angiotensin II receptor blocker is not being taken. She does not see a podiatrist.Eye exam is current.  Hyperlipidemia This is a chronic problem. The current episode started more than 1 year ago. The problem is controlled. Recent lipid tests were reviewed and are normal. Exacerbating diseases include diabetes. There are no known  factors aggravating her hyperlipidemia. Current antihyperlipidemic treatment includes statins. The current treatment provides moderate improvement of lipids. There are no compliance problems.  Risk factors for coronary artery disease include diabetes mellitus, dyslipidemia and family history.    Review of systems  Constitutional: + stable body weight- has gained back some unintentionally lost, current Body mass index is 21.53 kg/m., no fatigue, no subjective hyperthermia, no subjective hypothermia Eyes: no blurry vision, no xerophthalmia ENT: no sore throat, no nodules palpated in throat, no dysphagia/odynophagia, no hoarseness Cardiovascular: no chest pain, no shortness of breath, no palpitations, no leg swelling Respiratory: no cough, no shortness of breath Gastrointestinal: no nausea/vomiting/diarrhea Musculoskeletal: no muscle/joint aches Skin: no rashes, no hyperemia Neurological: no tremors, no numbness, no tingling, no dizziness Psychiatric: no depression, no anxiety  Objective:     BP 118/70    Pulse 77    Ht 5' 4.5" (1.638 m)    Wt 127 lb 6.4 oz (57.8 kg)    BMI 21.53 kg/m   Wt Readings from Last 3 Encounters:  05/01/21 127 lb 6.4 oz (57.8 kg)  04/17/21 122 lb 3.2 oz (55.4 kg)   11/23/20 129 lb (58.5 kg)     BP Readings from Last 3 Encounters:  05/01/21 118/70  04/17/21 91/64  11/23/20 118/72      Physical Exam- Limited  Constitutional:  Body mass index is 21.53 kg/m. , not in acute distress, normal state of mind Eyes:  EOMI, no exophthalmos Neck: Supple Cardiovascular: RRR, no murmurs, rubs, or gallops, no edema Respiratory: Adequate breathing efforts, no crackles, rales, rhonchi, or wheezing Musculoskeletal: no gross deformities, strength intact in all four extremities, no gross restriction of joint movements Skin:  no rashes, no hyperemia Neurological: no tremor with outstretched hands   POCT ABI Results 05/01/21   Right ABI:  1.17      Left ABI:  1.17  Right leg systolic / diastolic: 673/41 mmHg Left leg systolic / diastolic: 937/90 mmHg  Arm systolic / diastolic: 240/97 mmHG  Detailed report will be scanned into patient chart.   CMP ( most recent) CMP     Component Value Date/Time   NA 139 11/16/2018 0035   K 3.9 11/16/2018 0035   CL 108 11/16/2018 0035   CO2 24 11/16/2018 0035   GLUCOSE 185 (H) 11/16/2018 0035   BUN 19 03/12/2021 0000   CREATININE 0.6 03/12/2021 0000   CREATININE 0.74 11/16/2018 0035   CALCIUM 10.4 03/12/2021 0000   PROT 6.5 01/17/2019 0736   ALBUMIN 4.5 01/17/2019 0736   AST 25 01/17/2019 0736   ALT 32 01/17/2019 0736   ALKPHOS 82 01/17/2019 0736   BILITOT 0.3 01/17/2019 0736   GFRNONAA 105 03/12/2021 0000   GFRAA >60 11/16/2018 0035     Diabetic Labs (most recent): Lab Results  Component Value Date   HGBA1C 8.1 03/12/2021   HGBA1C 6.0 (H) 11/15/2018     Lipid Panel ( most recent) Lipid Panel     Component Value Date/Time   CHOL 79 (L) 01/17/2019 0736   TRIG 86 08/29/2020 0000   HDL 33 (L) 01/17/2019 0736   CHOLHDL 2.4 01/17/2019 0736   CHOLHDL 3.4 11/15/2018 0808   VLDL 18 11/15/2018 0808   LDLCALC 35 08/29/2020 0000   LDLCALC 33 01/17/2019 0736   LABVLDL 13 01/17/2019 0736      No  results found for: TSH, FREET4         Assessment & Plan:   1) Type 2 Diabetes without complications  She presents today with her meter, no logs, showing slowly improved glycemic profile overall.  She was not due for another A1c today.  She is still adjusting to her new meal and medication regimen but overall feels much better.  She notes that she has more energy now.  She denies any hypoglycemia.  - SHONNA DEITER has currently uncontrolled symptomatic type 2 DM since 56 years of age, with most recent A1c of 8.1 %.   -Recent labs reviewed.  - I had a long discussion with her about the progressive nature of diabetes and the pathology behind its complications. -her diabetes is complicated by CAD with MI and she remains at a high risk for more acute and chronic complications which include CAD, CVA, CKD, retinopathy, and neuropathy. These are all discussed in detail with her.  The following Lifestyle Medicine recommendations according to [facility]) were discussed and offered to patient and she agrees to start the journey:  A. Whole Foods, Plant-based plate comprising of fruits and vegetables, plant-based proteins, whole-grain carbohydrates was discussed in detail with the patient.   A list for source of those nutrients were also provided to the patient.  Patient will use only water or unsweetened tea for hydration. B.  The need to stay away from risky substances including alcohol, smoking; obtaining 7 to 9 hours of restorative sleep, at least 150 minutes of moderate intensity exercise weekly, the importance of healthy social connections,  and stress reduction techniques were discussed. C.  A full color page of  Calorie density of various food groups per pound showing examples of each food groups was provided to the patient.  - I have counseled her on diet and weight management by adopting a carbohydrate restricted/protein rich diet. Patient is encouraged to  switch to unprocessed or minimally processed complex starch and increased protein intake (animal or plant source), fruits, and vegetables. -  she is advised to stick to a routine mealtimes to eat 3 meals a day and avoid unnecessary snacks (to snack only to correct hypoglycemia).   - she acknowledges that there is a room for improvement in her food and drink choices. - Suggestion is made for her to avoid simple carbohydrates from her diet including Cakes, Sweet Desserts, Ice Cream, Soda (diet and regular), Sweet Tea, Candies, Chips, Cookies, Store Bought Juices, Alcohol in Excess of 1-2 drinks a day, Artificial Sweeteners, Coffee Creamer, and "Sugar-free" Products. This will help patient to have more stable blood glucose profile and potentially avoid unintended weight gain.  - she will be scheduled with Jearld Fenton, RDN, CDE for diabetes education.  - I have approached her with the following individualized plan to manage her diabetes and patient agrees:   -Given her stable, improving glycemic profile, no changes will be made to her medication regimen today.  She is advised to continue Metformin 1000 mg ER daily with breakfast and Jardiance 25 mg po daily.  -She is encouraged to continue monitoring blood glucose twice daily, before breakfast and before bed, and to call the clinic if she has readings less than 70 or above 200 for 3 tests in a row.  - she is not an ideal candidate for incretin therapy due to body habitus.  - Specific targets for  A1c; LDL, HDL, and Triglycerides were discussed with the patient.  2) Blood Pressure /Hypertension:  her blood pressure is controlled to target without the use of antihypertensive medications.    3) Lipids/Hyperlipidemia:  Review of her recent lipid panel from 08/29/20 showed controlled LDL at 35 .  she is advised to continue Lipitor 40 mg daily at bedtime.  Side effects and precautions discussed with her.  4)  Weight/Diet:  her Body mass index is  21.53 kg/m.  -    she is NOT a candidate for weight loss. Exercise, and detailed carbohydrates information provided  -  detailed on discharge instructions.  5) Hypercalcemia: She is known to have several instances of elevated blood calcium levels.  She also has history of osteoporosis.  She is on calcium supplement and eats calcium rich foods routinely.  She may benefit from further work up to rule out parathyroid involvement.  We discussed this today.  I added some additional blood work to assess this further.  She is asked to bring in all her supplements so we can see just how much she is taking on a daily basis.  6) Chronic Care/Health Maintenance: -she is not on ACEI/ARB and is on Statin medications and is encouraged to initiate and continue to follow up with Ophthalmology, Dentist, Podiatrist at least yearly or according to recommendations, and advised to stay away from smoking (quit a very long time ago). I have recommended yearly flu vaccine and pneumonia vaccine at least every 5 years; moderate intensity exercise for up to 150 minutes weekly; and sleep for at least 7 hours a day.  - she is advised to maintain close follow up with [name], MD for primary care needs, as well as her other providers for optimal and coordinated care.     I spent 44 minutes in the care of the patient today including review of labs from [place], Lipids, Thyroid Function, Hematology (current and previous including abstractions from other facilities); face-to-face time discussing  her blood glucose readings/logs, discussing hypoglycemia and hyperglycemia episodes and symptoms, medications doses, her options of short and long term treatment based on the latest standards of care / guidelines;  discussion about incorporating lifestyle medicine;  and documenting the encounter.    Please refer to Patient Instructions for Blood Glucose Monitoring and Insulin/Medications Dosing Guide"  in media tab for additional  information. Please  also refer to " Patient Self Inventory" in the Media  tab for reviewed elements of pertinent patient history.  Calton Dach participated in the discussions, expressed understanding, and voiced agreement with the above plans.  All questions were answered to her satisfaction. she is encouraged to contact clinic should she have any questions or concerns prior to her return visit.     Follow up plan: - Return in about 3 months (around 07/29/2021) for Diabetes F/U with A1c in office, Previsit labs, Bring meter and logs.    Rayetta Pigg, [facility] [address] [place], [place] [address] Phone: [phone] Fax: [phone]  05/01/2021, 3:09 PM

## 2021-05-01 NOTE — Patient Instructions (Signed)

## 2021-05-06 ENCOUNTER — Encounter: Payer: 59 | Attending: Nurse Practitioner | Admitting: Nutrition

## 2021-05-06 ENCOUNTER — Other Ambulatory Visit: Payer: Self-pay

## 2021-05-06 DIAGNOSIS — Z713 Dietary counseling and surveillance: Secondary | ICD-10-CM | POA: Diagnosis not present

## 2021-05-06 DIAGNOSIS — I5021 Acute systolic (congestive) heart failure: Secondary | ICD-10-CM

## 2021-05-06 DIAGNOSIS — E118 Type 2 diabetes mellitus with unspecified complications: Secondary | ICD-10-CM

## 2021-05-06 DIAGNOSIS — E119 Type 2 diabetes mellitus without complications: Secondary | ICD-10-CM | POA: Diagnosis not present

## 2021-05-06 NOTE — Progress Notes (Signed)
Medical Nutrition Therapy  Appointment Start time:  1330  Appointment End time:  74  Primary concerns today: Dm Type 2 Referral diagnosis: E11.8 Preferred learning style: no preference Learning readiness: Ready    NUTRITION ASSESSMENT  Since getting diagnosed with diabetes, she has worked hard to lose weight. She has gone from   189 lbs down to 129 lbs. Has been following a keto diet lifestyle. FBS:137-197 mg/dl.  Evenings: 189-200's.  Current diet is improving. Would benefit from more whole food plant based lifestyle and redue keto food choices. Needs more fruits, low carb vegetables, whole grains and legumes.  Anthropometrics  Wt Readings from Last 3 Encounters:  05/01/21 127 lb 6.4 oz (57.8 kg)  04/17/21 122 lb 3.2 oz (55.4 kg)  11/23/20 129 lb (58.5 kg)   Ht Readings from Last 3 Encounters:  05/01/21 5' 4.5" (1.638 m)  04/17/21 5' 4.5" (1.638 m)  11/23/20 5\' 5"  (1.651 m)   There is no height or weight on file to calculate BMI. @BMIFA @ Facility age limit for growth percentiles is 20 years. Facility age limit for growth percentiles is 20 years.    Clinical Medical Hx: See chart Medications: Jardiance,Metformin 1000 mg a day, Labs:  Lab Results  Component Value Date   HGBA1C 8.1 03/13/2021   CMP Latest Ref Rng & Units 03/13/2021 03/12/2021 08/29/2020  Glucose 70 - 99 mg/dL - - -  BUN 4 - 21 - 19 -  Creatinine 0.5 - 1.1 - 0.6 -  Sodium 135 - 145 mmol/L - - -  Potassium 3.5 - 5.1 mmol/L - - -  Chloride 98 - 111 mmol/L - - -  CO2 22 - 32 mmol/L - - -  Calcium 8.7 - 10.7 10.4 - 10.1  Total Protein 6.0 - 8.5 g/dL - - -  Total Bilirubin 0.0 - 1.2 mg/dL - - -  Alkaline Phos 39 - 117 IU/L - - -  AST 0 - 40 IU/L - - -  ALT 0 - 32 IU/L - - -   Lipid Panel     Component Value Date/Time   CHOL 79 (L) 01/17/2019 0736   TRIG 86 08/29/2020 0000   HDL 33 (L) 01/17/2019 0736   CHOLHDL 2.4 01/17/2019 0736   CHOLHDL 3.4 11/15/2018 0808   VLDL 18 11/15/2018 0808    LDLCALC 35 08/29/2020 0000   LDLCALC 33 01/17/2019 0736   LABVLDL 13 01/17/2019 0736    Notable Signs/Symptoms: None  Lifestyle & Dietary Hx LIves with her husband. Eats out often. Making better choices. Doesn't cook at home. Still working FT at  [employer]. Has a stressful job.  Estimated daily fluid intake: 130 oz Supplements: [place], Vit B 12 Sleep: 7-8 Stress / self-care: no Current average weekly physical activity: walks some  24-Hr Dietary Recall FBS: 160 mg/dl.     First Meal: Keto savery life crunch 1/3 c, 2 boiled eggs, [place] sausage link, avacado and 1 slice bread, 12 grapes,  Second Meal: shredded cabbage, carrots, bell peppers, spring mix, cheese, dressing, water 10 black berries Snack:  Third Meal: 730 thin crust pizza of 4 slices, water   767 pm   BS was 290's 2 hours after supper. Beverages: water  Estimated Energy Needs Calories: 1500 Carbohydrate: 170g Protein: 112g Fat: 42g   NUTRITION DIAGNOSIS  NB-1.1 Food and nutrition-related knowledge deficit As related to Diabetes Type 2.  As evidenced by A1C 8.1%.   NUTRITION INTERVENTION  Nutrition education (E-1) on the following topics:  Handouts Provided Include  Lifestyle medicine plant based meal planning Meal Plan Card   Learning Style & Readiness for Change Teaching method utilized: Visual & Auditory  Demonstrated degree of understanding via: Teach Back  Barriers to learning/adherence to lifestyle change: none  Goals Established by Pt Goals  Eat three meals per day as discussed Eat 30-45 g CHO at each meal Drink only water Increase whole food plant based options for meals Avoid following a keto diet. Increase fresh fruits, vegetables and whole grains. Get A1C down to 7%. Don't lose anymore weight.   MONITORING & EVALUATION Dietary intake, weekly physical activity, and blood sugars and meal plans in 1-2 months.  Next Steps  Patient is to work on increase whole foods of  fruits, vegetables and grains.Marland Kitchen

## 2021-05-15 ENCOUNTER — Other Ambulatory Visit: Payer: Self-pay | Admitting: Interventional Cardiology

## 2021-05-23 ENCOUNTER — Telehealth: Payer: Self-pay | Admitting: Nurse Practitioner

## 2021-05-23 NOTE — Telephone Encounter (Signed)
Tell her not to worry too much while she is on vacation.  There are lots of things that could be impacting her sugar readings like eating on a different time schedule than before, alcoholic drinks (regardless of whether or not they are sweet), diet, etc.  I want her to enjoy herself but not too much!  BALANCE is key.  We can touch base when she gets home and gets back to her regular routine.

## 2021-05-23 NOTE — Telephone Encounter (Signed)
Pt is calling about her readings in the morning and feels they are getting higher since taking away metformin. States she is at the pool right now but wanted to give me the morning readings and at night she knows it has been in the 400s every evening.  2/21 208am  2/22 248am  2/23 228am  [phone]

## 2021-05-23 NOTE — Telephone Encounter (Signed)
Patient verbalized understanding  

## 2021-05-28 ENCOUNTER — Encounter: Payer: Self-pay | Admitting: Nutrition

## 2021-05-28 ENCOUNTER — Encounter: Payer: Self-pay | Admitting: Nurse Practitioner

## 2021-05-28 NOTE — Patient Instructions (Signed)
Goals  Eat three meals per day as discussed Eat 30-45 g CHO at each meal Drink only water Increase whole food plant based options for meals Avoid following a keto diet. Increase fresh fruits, vegetables and whole grains. Get A1C down to 7%. Don't lose anymore weight.

## 2021-06-03 ENCOUNTER — Encounter: Payer: Self-pay | Admitting: Nurse Practitioner

## 2021-06-03 ENCOUNTER — Encounter: Payer: Self-pay | Admitting: Interventional Cardiology

## 2021-06-05 ENCOUNTER — Ambulatory Visit: Payer: 59 | Admitting: Nutrition

## 2021-07-23 LAB — COMPREHENSIVE METABOLIC PANEL
ALT: 21 IU/L (ref 0–32)
AST: 22 IU/L (ref 0–40)
Albumin/Globulin Ratio: 2.3 — ABNORMAL HIGH (ref 1.2–2.2)
Albumin: 4.4 g/dL (ref 3.8–4.9)
Alkaline Phosphatase: 103 IU/L (ref 44–121)
BUN/Creatinine Ratio: 24 — ABNORMAL HIGH (ref 9–23)
BUN: 17 mg/dL (ref 6–24)
Bilirubin Total: 0.3 mg/dL (ref 0.0–1.2)
CO2: 23 mmol/L (ref 20–29)
Calcium: 9.8 mg/dL (ref 8.7–10.2)
Chloride: 108 mmol/L — ABNORMAL HIGH (ref 96–106)
Creatinine, Ser: 0.72 mg/dL (ref 0.57–1.00)
Globulin, Total: 1.9 g/dL (ref 1.5–4.5)
Glucose: 175 mg/dL — ABNORMAL HIGH (ref 70–99)
Potassium: 4.5 mmol/L (ref 3.5–5.2)
Sodium: 143 mmol/L (ref 134–144)
Total Protein: 6.3 g/dL (ref 6.0–8.5)
eGFR: 98 mL/min/{1.73_m2} (ref >59)

## 2021-07-23 LAB — PTH, INTACT AND CALCIUM: PTH: 64 pg/mL (ref 15–65)

## 2021-07-23 LAB — MAGNESIUM: Magnesium: 2.1 mg/dL (ref 1.6–2.3)

## 2021-07-23 LAB — PHOSPHORUS: Phosphorus: 3.2 mg/dL (ref 3.0–4.3)

## 2021-07-23 LAB — VITAMIN D 25 HYDROXY (VIT D DEFICIENCY, FRACTURES): Vit D, 25-Hydroxy: 57.6 ng/mL (ref 30.0–100.0)

## 2021-07-25 ENCOUNTER — Encounter: Payer: Self-pay | Admitting: Nurse Practitioner

## 2021-07-25 NOTE — Progress Notes (Signed)
?Cardiology Office Note:   ? ?Date:  07/26/2021  ? ?ID:  Casey Mason, DOB [birth year], MRN [id number] ? ?PCP:  [name], MD  ?Cardiologist:  [name], MD  ? ?Referring MD: [name], MD  ? ?Chief Complaint  ?Patient presents with  ? Coronary Artery Disease  ? Hypertension  ? Follow-up  ?  Tobacco use  ? ? ?History of Present Illness:   ? ?Casey Mason is a 56 y.o. female with a hx of DM II, tobacco use, hypertension,  hyperlipidemia, and CAD with anterior STEMI and LAD stent 11/14/2018. ? ? ?Doing well.  She has occasional sensation of throbbing in her chest.  This occurs randomly.  A monitor did not reveal any significant arrhythmia.  She has no exertional related discomfort.  She denies shortness of breath.  She has cyclical peripheral edema.  This is not a major concern or issue. ? ?Having trouble controlling her diabetes. ? ?Past Medical History:  ?Diagnosis Date  ? Diabetes mellitus without complication ([place])   ? Diverticulosis of colon   ? Family history of adverse reaction to anesthesia   ? mother-- ponv  ? History of colon polyps   ? 2016- hyperplastic  ? History of diverticulitis of colon   ? Nephrolithiasis   ? right  ? PONV (postoperative nausea and vomiting)   ? Prolapse of female bladder, acquired   ? Right ureteral stone   ? ? ?Past Surgical History:  ?Procedure Laterality Date  ? BLADDER SUSPENSION    ? [place]  ? COLONOSCOPY  11-10-2014  in [place]  ? CORONARY STENT INTERVENTION N/A 11/14/2018  ? Procedure: CORONARY STENT INTERVENTION;  Surgeon: Belva Crome, MD;  Location: [facility];  Service: Cardiovascular;  Laterality: N/A;  ? CORONARY/GRAFT ACUTE MI REVASCULARIZATION N/A 11/14/2018  ? Procedure: Coronary/Graft Acute MI Revascularization;  Surgeon: Belva Crome, MD;  Location: [facility];  Service: Cardiovascular;  Laterality: N/A;  ? CYSTOSCOPY WITH RETROGRADE PYELOGRAM, URETEROSCOPY AND STENT PLACEMENT Right 02/07/2016  ? Procedure: CYSTOSCOPY WITH  RIGHT PYLEOGRAM  RIGHT URETEROSCOPY, , DIALATION OF RIGHT URETER STRICUTURE FLEXIBLE RIGHT URETEROSCOPY, RIGHT PYELOSCOPY,  AND RIGHT DOUBLE STENT PLACEMENT WITH TETHER;  Surgeon: Carolan Clines, MD;  Location: [facility];  Service: Urology;  Laterality: Right;  ? EXTRACORPOREAL SHOCK WAVE LITHOTRIPSY Right 09/20/2015  ? LEFT HEART CATH AND CORONARY ANGIOGRAPHY N/A 11/14/2018  ? Procedure: LEFT HEART CATH AND CORONARY ANGIOGRAPHY;  Surgeon: Belva Crome, MD;  Location: [facility];  Service: Cardiovascular;  Laterality: N/A;  ? ROTATOR CUFF REPAIR Right 2013 or 2014??  ? TONSILLECTOMY  2003  ? TUBAL LIGATION  1992  ? VAGINAL HYSTERECTOMY  2005  ? ? ?Current Medications: ?Current Meds  ?Medication Sig  ? atorvastatin (LIPITOR) 40 MG tablet Take 1 tablet (40 mg total) by mouth daily.  ? Cholecalciferol (VITAMIN D3) 2000 units TABS Take 2 tablets by mouth daily.   ? Cinnamon 500 MG capsule Take 1,000 mg by mouth daily.   ? Coenzyme Q10 100 MG capsule Take 100 mg by mouth daily.  ? empagliflozin (JARDIANCE) 25 MG TABS tablet Take 25 mg by mouth daily.  ? ibandronate (BONIVA) 150 MG tablet Take 150 mg by mouth every 30 (thirty) days.  ? Lancets (ONETOUCH DELICA PLUS [id number]) MISC 2 (two) times daily Use as instructed.  ? metFORMIN (GLUCOPHAGE) 500 MG tablet Take 1,000 mg by mouth daily after breakfast.  ?  Multiple Vitamin (MULTIVITAMIN) tablet Take 1 tablet by mouth daily.  ? nitroGLYCERIN (NITROSTAT) 0.4 MG SL tablet PLACE 1 TABLET (0.4 MG TOTAL) UNDER THE TONGUE EVERY 5 (FIVE) MINUTES X 3 DOSES AS NEEDED FOR CHEST PAIN.  ? ONETOUCH ULTRA test strip TEST TWICE DAILY E11.65  ? OVER THE COUNTER MEDICATION daily. Berberine Gluco Gold Supplement  ? OVER THE COUNTER MEDICATION Take by mouth daily. Ceylom Cinnamon Supplement  ? TRULICITY 1.5 WU/9.8JX SOPN once a week.  ? vitamin B-12 (CYANOCOBALAMIN) 100 MCG tablet Take 100 mcg by mouth daily.  ? [DISCONTINUED] clopidogrel (PLAVIX) 75 MG tablet  TAKE 1 TABLET BY MOUTH EVERY DAY  ?  ? ?Allergies:   Diphenhydramine-acetaminophen, Oxycodone, Sulfa antibiotics, and Sulfur  ? ?Social History  ? ?Socioeconomic History  ? Marital status: Married  ?  Spouse name: Denyse Amass  ? Number of children: 2  ? Years of education: 26  ? Highest education level: Associate degree: occupational, Hotel manager, or vocational program  ?Occupational History  ? Not on file  ?Tobacco Use  ? Smoking status: Former  ?  Packs/day: 0.50  ?  Years: 15.00  ?  Pack years: 7.50  ?  Types: Cigarettes  ?  Quit date: 11/14/2018  ?  Years since quitting: 2.6  ? Smokeless tobacco: Never  ?Vaping Use  ? Vaping Use: Never used  ?Substance and Sexual Activity  ? Alcohol use: Yes  ?  Alcohol/week: 0.0 standard drinks  ?  Comment: occasional  ? Drug use: No  ? Sexual activity: Not on file  ?Other Topics Concern  ? Not on file  ?Social History Narrative  ? Not on file  ? ?Social Determinants of Health  ? ?Financial Resource Strain: Not on file  ?Food Insecurity: Not on file  ?Transportation Needs: Not on file  ?Physical Activity: Not on file  ?Stress: Not on file  ?Social Connections: Not on file  ?  ? ?Family History: ?The patient's family history includes Anemia in her mother; Emphysema in her mother; Osteoporosis in her mother. There is no history of Stomach cancer or Esophageal cancer. ? ?ROS:   ?Please see the history of present illness.    ?Has anxiety concerning her heart health.  Is concerned about the fluctuating blood sugars.  Most recent hemoglobin A1c was 8.1 despite weight loss and care with her diet.  All other systems reviewed and are negative. ? ?EKGs/Labs/Other Studies Reviewed:   ? ?The following studies were reviewed today: ?2D Doppler echocardiography 12/24/2020: ?IMPRESSIONS  ? ? ? 1. Left ventricular ejection fraction, by estimation, is 60 to 65%. The  ?left ventricle has normal function. The left ventricle has no regional  ?wall motion abnormalities. Left ventricular diastolic parameters  were  ?normal.  ? 2. Right ventricular systolic function is normal. The right ventricular  ?size is normal. There is normal pulmonary artery systolic pressure. The  ?estimated right ventricular systolic pressure is 91.4 mmHg.  ? 3. The mitral valve is normal in structure. Mild mitral valve  ?regurgitation. No evidence of mitral stenosis.  ? 4. The aortic valve is normal in structure. Aortic valve regurgitation is  ?not visualized. No aortic stenosis is present.  ? 5. The inferior vena cava is normal in size with greater than 50%  ?respiratory variability, suggesting right atrial pressure of 3 mmHg.  ? ?EKG:  EKG normal sinus rhythm, nonspecific ST abnormality, nonspecific T wave flattening, otherwise normal. ? ?Recent Labs: ?07/22/2021: ALT 21; BUN 17; Creatinine, Ser 0.72; Magnesium 2.1; Potassium  4.5; Sodium 143  ?Recent Lipid Panel ?   ?Component Value Date/Time  ? CHOL 79 (L) 01/17/2019 0736  ? TRIG 86 08/29/2020 0000  ? HDL 33 (L) 01/17/2019 0736  ? CHOLHDL 2.4 01/17/2019 0736  ? CHOLHDL 3.4 11/15/2018 0808  ? VLDL 18 11/15/2018 0808  ? [place] 35 08/29/2020 0000  ? [place] 33 01/17/2019 0736  ? ? ?Physical Exam:   ? ?VS:  BP 98/68   Pulse 88   Ht 5' 4.5" (1.638 m)   Wt 125 lb 6.4 oz (56.9 kg)   SpO2 97%   BMI 21.19 kg/m?    ? ?Wt Readings from Last 3 Encounters:  ?07/26/21 125 lb 6.4 oz (56.9 kg)  ?05/01/21 127 lb 6.4 oz (57.8 kg)  ?04/17/21 122 lb 3.2 oz (55.4 kg)  ?  ? ?GEN: Healthy appearing. No acute distress ?HEENT: Normal ?NECK: No JVD. ?LYMPHATICS: No lymphadenopathy ?CARDIAC: No murmur. RRR S4 but no S3 gallop, or edema. ?VASCULAR:  Normal Pulses. No bruits. ?RESPIRATORY:  Clear to auscultation without rales, wheezing or rhonchi  ?ABDOMEN: Soft, non-tender, non-distended, No pulsatile mass, ?MUSCULOSKELETAL: No deformity  ?SKIN: Warm and dry ?NEUROLOGIC:  Alert and oriented x 3 ?PSYCHIATRIC:  Normal affect  ? ?ASSESSMENT:   ? ?1. Coronary artery disease of native artery of native heart with stable  angina pectoris ([place])   ?2. Essential hypertension   ?3. Hyperlipidemia with target LDL less than 70   ?4. Type 2 diabetes mellitus with complication, with long-term current use of insulin ([facility])   ?5. Acute

## 2021-07-26 ENCOUNTER — Encounter: Payer: Self-pay | Admitting: Interventional Cardiology

## 2021-07-26 ENCOUNTER — Ambulatory Visit: Payer: 59 | Admitting: Interventional Cardiology

## 2021-07-26 VITALS — BP 98/68 | HR 88 | Ht 64.5 in | Wt 125.4 lb

## 2021-07-26 DIAGNOSIS — Z720 Tobacco use: Secondary | ICD-10-CM

## 2021-07-26 DIAGNOSIS — I5021 Acute systolic (congestive) heart failure: Secondary | ICD-10-CM | POA: Diagnosis not present

## 2021-07-26 DIAGNOSIS — I25118 Atherosclerotic heart disease of native coronary artery with other forms of angina pectoris: Secondary | ICD-10-CM

## 2021-07-26 DIAGNOSIS — E118 Type 2 diabetes mellitus with unspecified complications: Secondary | ICD-10-CM | POA: Diagnosis not present

## 2021-07-26 DIAGNOSIS — Z794 Long term (current) use of insulin: Secondary | ICD-10-CM

## 2021-07-26 DIAGNOSIS — I10 Essential (primary) hypertension: Secondary | ICD-10-CM

## 2021-07-26 DIAGNOSIS — E785 Hyperlipidemia, unspecified: Secondary | ICD-10-CM | POA: Diagnosis not present

## 2021-07-26 MED ORDER — CLOPIDOGREL BISULFATE 75 MG PO TABS: 75.0000 mg | ORAL_TABLET | Freq: Every day | ORAL | 3 refills | Status: DC

## 2021-07-26 NOTE — Patient Instructions (Signed)

## 2021-08-06 ENCOUNTER — Ambulatory Visit: Payer: 59 | Admitting: Nurse Practitioner

## 2021-08-06 ENCOUNTER — Encounter: Payer: Self-pay | Admitting: Nurse Practitioner

## 2021-08-06 VITALS — BP 111/73 | HR 86 | Ht 64.5 in | Wt 128.0 lb

## 2021-08-06 DIAGNOSIS — E119 Type 2 diabetes mellitus without complications: Secondary | ICD-10-CM

## 2021-08-06 DIAGNOSIS — E782 Mixed hyperlipidemia: Secondary | ICD-10-CM

## 2021-08-06 LAB — POCT GLYCOSYLATED HEMOGLOBIN (HGB A1C): HbA1c POC (<> result, manual entry): 10.4 % (ref 4.0–5.6)

## 2021-08-06 NOTE — Patient Instructions (Signed)
Diabetes Mellitus and Foot Care Foot care is an important part of your health, especially when you have diabetes. Diabetes may cause you to have problems because of poor blood flow (circulation) to your feet and legs, which can cause your skin to: Become thinner and drier. Break more easily. Heal more slowly. Peel and crack. You may also have nerve damage (neuropathy) in your legs and feet, causing decreased feeling in them. This means that you may not notice minor injuries to your feet that could lead to more serious problems. Noticing and addressing any potential problems early is the best way to prevent future foot problems. How to care for your feet Foot hygiene  Wash your feet daily with warm water and mild soap. Do not use hot water. Then, pat your feet and the areas between your toes until they are completely dry. Do not soak your feet as this can dry your skin. Trim your toenails straight across. Do not dig under them or around the cuticle. File the edges of your nails with an emery board or nail file. Apply a moisturizing lotion or petroleum jelly to the skin on your feet and to dry, brittle toenails. Use lotion that does not contain alcohol and is unscented. Do not apply lotion between your toes. Shoes and socks Wear clean socks or stockings every day. Make sure they are not too tight. Do not wear knee-high stockings since they may decrease blood flow to your legs. Wear shoes that fit properly and have enough cushioning. Always look in your shoes before you put them on to be sure there are no objects inside. To break in new shoes, wear them for just a few hours a day. This prevents injuries on your feet. Wounds, scrapes, corns, and calluses  Check your feet daily for blisters, cuts, bruises, sores, and redness. If you cannot see the bottom of your feet, use a mirror or ask someone for help. Do not cut corns or calluses or try to remove them with medicine. If you find a minor scrape,  cut, or break in the skin on your feet, keep it and the skin around it clean and dry. You may clean these areas with mild soap and water. Do not clean the area with peroxide, alcohol, or iodine. If you have a wound, scrape, corn, or callus on your foot, look at it several times a day to make sure it is healing and not infected. Check for: Redness, swelling, or pain. Fluid or blood. Warmth. Pus or a bad smell. General tips Do not cross your legs. This may decrease blood flow to your feet. Do not use heating pads or hot water bottles on your feet. They may burn your skin. If you have lost feeling in your feet or legs, you may not know this is happening until it is too late. Protect your feet from hot and cold by wearing shoes, such as at the beach or on hot pavement. Schedule a complete foot exam at least once a year (annually) or more often if you have foot problems. Report any cuts, sores, or bruises to your health care provider immediately. Where to find more information American Diabetes Association: www.diabetes.org Association of Diabetes Care & Education Specialists: www.diabeteseducator.org Contact a health care provider if: You have a medical condition that increases your risk of infection and you have any cuts, sores, or bruises on your feet. You have an injury that is not healing. You have redness on your legs or feet. You   feel burning or tingling in your legs or feet. You have pain or cramps in your legs and feet. Your legs or feet are numb. Your feet always feel cold. You have pain around any toenails. Get help right away if: You have a wound, scrape, corn, or callus on your foot and: You have pain, swelling, or redness that gets worse. You have fluid or blood coming from the wound, scrape, corn, or callus. Your wound, scrape, corn, or callus feels warm to the touch. You have pus or a bad smell coming from the wound, scrape, corn, or callus. You have a fever. You have a red  line going up your leg. Summary Check your feet every day for blisters, cuts, bruises, sores, and redness. Apply a moisturizing lotion or petroleum jelly to the skin on your feet and to dry, brittle toenails. Wear shoes that fit properly and have enough cushioning. If you have foot problems, report any cuts, sores, or bruises to your health care provider immediately. Schedule a complete foot exam at least once a year (annually) or more often if you have foot problems. This information is not intended to replace advice given to you by your health care provider. Make sure you discuss any questions you have with your health care provider. Document Revised: 10/06/2019 Document Reviewed: 10/06/2019 Elsevier Patient Education  2023 Elsevier Inc.  

## 2021-08-06 NOTE — Progress Notes (Signed)
? ?                                                    Endocrinology Follow Up Note  ?     08/06/2021, 4:09 PM ? ? ?Subjective:  ? ? Patient ID: Casey Mason, female    DOB: [birth year].  ?VERLON CARCIONE is being seen in follow up after being seen in consultation for management of currently uncontrolled symptomatic diabetes requested by  [name], MD. ? ? ?Past Medical History:  ?Diagnosis Date  ? Diabetes mellitus without complication ([place])   ? Diverticulosis of colon   ? Family history of adverse reaction to anesthesia   ? mother-- ponv  ? History of colon polyps   ? 2016- hyperplastic  ? History of diverticulitis of colon   ? Nephrolithiasis   ? right  ? PONV (postoperative nausea and vomiting)   ? Prolapse of female bladder, acquired   ? Right ureteral stone   ? ? ?Past Surgical History:  ?Procedure Laterality Date  ? BLADDER SUSPENSION    ? [place]  ? COLONOSCOPY  11-10-2014  in [place]  ? CORONARY STENT INTERVENTION N/A 11/14/2018  ? Procedure: CORONARY STENT INTERVENTION;  Surgeon: Belva Crome, MD;  Location: [facility];  Service: Cardiovascular;  Laterality: N/A;  ? CORONARY/GRAFT ACUTE MI REVASCULARIZATION N/A 11/14/2018  ? Procedure: Coronary/Graft Acute MI Revascularization;  Surgeon: Belva Crome, MD;  Location: [facility];  Service: Cardiovascular;  Laterality: N/A;  ? CYSTOSCOPY WITH RETROGRADE PYELOGRAM, URETEROSCOPY AND STENT PLACEMENT Right 02/07/2016  ? Procedure: CYSTOSCOPY WITH RIGHT PYLEOGRAM  RIGHT URETEROSCOPY, , DIALATION OF RIGHT URETER STRICUTURE FLEXIBLE RIGHT URETEROSCOPY, RIGHT PYELOSCOPY,  AND RIGHT DOUBLE STENT PLACEMENT WITH TETHER;  Surgeon: Carolan Clines, MD;  Location: [facility];  Service: Urology;  Laterality: Right;  ? EXTRACORPOREAL SHOCK WAVE LITHOTRIPSY Right 09/20/2015  ? LEFT HEART CATH AND CORONARY ANGIOGRAPHY N/A 11/14/2018  ? Procedure: LEFT HEART CATH AND CORONARY ANGIOGRAPHY;  Surgeon: Belva Crome,  MD;  Location: [facility];  Service: Cardiovascular;  Laterality: N/A;  ? ROTATOR CUFF REPAIR Right 2013 or 2014??  ? TONSILLECTOMY  2003  ? TUBAL LIGATION  1992  ? VAGINAL HYSTERECTOMY  2005  ? ? ?Social History  ? ?Socioeconomic History  ? Marital status: Married  ?  Spouse name: Denyse Amass  ? Number of children: 2  ? Years of education: 58  ? Highest education level: Associate degree: occupational, Hotel manager, or vocational program  ?Occupational History  ? Not on file  ?Tobacco Use  ? Smoking status: Former  ?  Packs/day: 0.50  ?  Years: 15.00  ?  Pack years: 7.50  ?  Types: Cigarettes  ?  Quit date: 11/14/2018  ?  Years since quitting: 2.7  ? Smokeless tobacco: Never  ?Vaping Use  ? Vaping Use: Never used  ?Substance and Sexual Activity  ? Alcohol use: Yes  ?  Alcohol/week: 0.0 standard drinks  ?  Comment: occasional  ? Drug use: No  ? Sexual activity: Not on file  ?Other Topics Concern  ? Not on file  ?Social History Narrative  ? Not on file  ? ?Social Determinants of Health  ? ?Financial Resource Strain: Not on file  ?Food Insecurity: Not on file  ?  Transportation Needs: Not on file  ?Physical Activity: Not on file  ?Stress: Not on file  ?Social Connections: Not on file  ? ? ?Family History  ?Problem Relation Age of Onset  ? Anemia Mother   ? Emphysema Mother   ? Osteoporosis Mother   ? Stomach cancer Neg Hx   ? Esophageal cancer Neg Hx   ? ? ?Outpatient Encounter Medications as of 08/06/2021  ?Medication Sig  ? atorvastatin (LIPITOR) 40 MG tablet Take 1 tablet (40 mg total) by mouth daily.  ? Cholecalciferol (VITAMIN D3) 2000 units TABS Take 2 tablets by mouth daily.   ? Cinnamon 500 MG capsule Take 1,000 mg by mouth daily.   ? clopidogrel (PLAVIX) 75 MG tablet Take 1 tablet (75 mg total) by mouth daily.  ? Coenzyme Q10 100 MG capsule Take 100 mg by mouth daily.  ? empagliflozin (JARDIANCE) 25 MG TABS tablet Take 25 mg by mouth daily.  ? ibandronate (BONIVA) 150 MG tablet Take 150 mg by mouth every 30 (thirty)  days.  ? Lancets (ONETOUCH DELICA PLUS [id number]) MISC 2 (two) times daily Use as instructed.  ? metFORMIN (GLUCOPHAGE) 500 MG tablet Take 1,000 mg by mouth daily after breakfast.  ? Multiple Vitamin (MULTIVITAMIN) tablet Take 1 tablet by mouth daily.  ? nitroGLYCERIN (NITROSTAT) 0.4 MG SL tablet PLACE 1 TABLET (0.4 MG TOTAL) UNDER THE TONGUE EVERY 5 (FIVE) MINUTES X 3 DOSES AS NEEDED FOR CHEST PAIN.  ? ONETOUCH ULTRA test strip TEST TWICE DAILY E11.65  ? OVER THE COUNTER MEDICATION daily. Berberine Gluco Gold Supplement  ? OVER THE COUNTER MEDICATION Take by mouth daily. Ceylom Cinnamon Supplement  ? TRULICITY 1.5 FU/9.3AT SOPN once a week.  ? vitamin B-12 (CYANOCOBALAMIN) 100 MCG tablet Take 100 mcg by mouth daily.  ? ?No facility-administered encounter medications on file as of 08/06/2021.  ? ? ?ALLERGIES: ?Allergies  ?Allergen Reactions  ? Diphenhydramine-Acetaminophen Hives  ? Oxycodone Itching  ? Sulfa Antibiotics Itching  ? Sulfur   ?  Other reaction(s): Unknown  ? ? ?VACCINATION STATUS: ?Immunization History  ?Administered Date(s) Administered  ? PFIZER(Purple Top)SARS-COV-2 Vaccination 12/29/2019  ? ? ?Diabetes ?She presents for her follow-up diabetic visit. She has type 2 diabetes mellitus. Onset time: diagnosed at approx age of 63. Her disease course has been worsening. There are no hypoglycemic associated symptoms. (Ankle swelling) There are no hypoglycemic complications. Symptoms are worsening. Diabetic complications include heart disease. Risk factors for coronary artery disease include diabetes mellitus, dyslipidemia, family history, hypertension and tobacco exposure. Current diabetic treatment includes oral agent (triple therapy) (Has restarted taking her Trulicity 1.5 injections due to high readings). She is compliant with treatment most of the time. Her weight is stable. She is following a generally healthy diet. Meal planning includes avoidance of concentrated sweets. She has had a previous visit  with a dietitian (has seen dietician in the past). She participates in exercise intermittently. Her home blood glucose trend is increasing steadily. Her breakfast blood glucose range is generally >200 mg/dl. (She presents today with her meter, no logs, showing worsening glycemic profile with gross hyperglycemia overall.  Her POCT A1c today is 10.4% increasing from last visit of 8.1%.  She has been to [place] several times since last visit and did drink some alcohol but not in excess and ate essentially clean.  She eats proper portion sizes of fruits and veggies, avoids meats.  She has experienced some ankle swelling since last visit which cannot be explained from an endocrinology or  cardiology standpoint.  Her cardiologist recommended she stay on the Jardiance and restart the Trulicity which she did and has not seen any improvement in her readings.  She is frustrated that her readings are getting worse despite her efforts to eat healthy and exercise.  Analysis of her meter shows 7-day average of 214, 14-day average of 196, 30-day average of 187.) An ACE inhibitor/angiotensin II receptor blocker is not being taken. She does not see a podiatrist.Eye exam is current.  ?Hyperlipidemia ?This is a chronic problem. The current episode started more than 1 year ago. The problem is controlled. Recent lipid tests were reviewed and are normal. Exacerbating diseases include diabetes. There are no known factors aggravating her hyperlipidemia. Current antihyperlipidemic treatment includes statins. The current treatment provides moderate improvement of lipids. There are no compliance problems.  Risk factors for coronary artery disease include diabetes mellitus, dyslipidemia and family history.  ? ? ?Review of systems ? ?Constitutional: + Minimally fluctuating body weight,  current Body mass index is 21.63 kg/m?. , no fatigue, no subjective hyperthermia, no subjective hypothermia ?Eyes: no blurry vision, no xerophthalmia ?ENT: no  sore throat, no nodules palpated in throat, no dysphagia/odynophagia, no hoarseness ?Cardiovascular: no chest pain, no shortness of breath, no palpitations, + ankle swelling ?Respiratory: no cough, no sh

## 2021-08-14 ENCOUNTER — Telehealth: Payer: Self-pay

## 2021-08-14 NOTE — Telephone Encounter (Signed)
Left msg for pt to call back

## 2021-08-14 NOTE — Telephone Encounter (Signed)
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  We need to schedule another in-person visit to discuss what this means moving forward and go over insulin injection technique and such.  She does not need to wait until the 30th or whenever the follow up was scheduled.  Her glucose is just going to be the same or even worse.  We need to jump on this as soon as we can. ?

## 2021-08-16 NOTE — Telephone Encounter (Signed)
Spoke with patient this morning regarding her new diagnosis.  She will need follow up in office soon to discuss insulin administration.

## 2021-08-16 NOTE — Telephone Encounter (Signed)
Patient left a VM requesting to speak back with you please

## 2021-08-17 ENCOUNTER — Encounter: Payer: Self-pay | Admitting: Interventional Cardiology

## 2021-08-17 LAB — DIABETES AUTOIMMUNE PROFILE
Anti GAD 65 Antibodies: >120 U/mL — ABNORMAL HIGH
IA-2 Autoantibodies: <7.5 U/mL
Insulin AutoAb: <5 uU/mL
ZNT8 Antibodies: <15 U/mL

## 2021-08-17 LAB — INSULIN AND C-PEPTIDE, SERUM
C-Peptide: 1.9 ng/mL (ref 1.1–4.4)
INSULIN: 9.4 u[IU]/mL (ref 2.6–24.9)

## 2021-08-21 ENCOUNTER — Ambulatory Visit: Payer: 59 | Admitting: Nurse Practitioner

## 2021-08-21 ENCOUNTER — Encounter: Payer: Self-pay | Admitting: Nurse Practitioner

## 2021-08-21 VITALS — BP 110/76 | HR 80 | Ht 64.5 in | Wt 126.2 lb

## 2021-08-21 DIAGNOSIS — E139 Other specified diabetes mellitus without complications: Secondary | ICD-10-CM

## 2021-08-21 MED ORDER — PEN NEEDLES 32G X 5 MM MISC: 11 refills | Status: DC

## 2021-08-21 MED ORDER — INSULIN LISPRO (1 UNIT DIAL) 100 UNIT/ML (KWIKPEN): 1.0000 [IU] | PEN_INJECTOR | Freq: Three times a day (TID) | SUBCUTANEOUS | 3 refills | Status: DC

## 2021-08-21 MED ORDER — LANTUS SOLOSTAR 100 UNIT/ML ~~LOC~~ SOPN: 14.0000 [IU] | PEN_INJECTOR | Freq: Every day | SUBCUTANEOUS | 3 refills | Status: DC

## 2021-08-21 NOTE — Progress Notes (Signed)
Endocrinology Follow Up Note       08/21/2021, 3:48 PM   Subjective:    Patient ID: Casey Mason, female    DOB: Aug 16, 1965.  Casey Mason is being seen in follow up after being seen in consultation for management of currently uncontrolled symptomatic diabetes requested by  Casey Percy, MD.   Past Medical History:  Diagnosis Date   Diabetes mellitus without complication [facility])    Diverticulosis of colon    Family history of adverse reaction to anesthesia    mother-- ponv   History of colon polyps    2016- hyperplastic   History of diverticulitis of colon    Nephrolithiasis    right   PONV (postoperative nausea and vomiting)    Prolapse of female bladder, acquired    Right ureteral stone     Past Surgical History:  Procedure Laterality Date   [place]   COLONOSCOPY  11-10-2014  in [place] 11/14/2018   Procedure: CORONARY STENT INTERVENTION;  Surgeon: Casey Crome, MD;  Location: [place] [facility];  Service: Cardiovascular;  Laterality: N/A;   CORONARY/GRAFT ACUTE MI REVASCULARIZATION N/A 11/14/2018   Procedure: Coronary/Graft Acute MI Revascularization;  Surgeon: Casey Crome, MD;  Location: [place] [facility];  Service: Cardiovascular;  Laterality: N/A;   CYSTOSCOPY WITH RETROGRADE PYELOGRAM, URETEROSCOPY AND STENT PLACEMENT Right 02/07/2016   Procedure: CYSTOSCOPY WITH RIGHT PYLEOGRAM  RIGHT URETEROSCOPY, , DIALATION OF RIGHT URETER STRICUTURE FLEXIBLE RIGHT URETEROSCOPY, RIGHT PYELOSCOPY,  AND RIGHT DOUBLE STENT PLACEMENT WITH TETHER;  Surgeon: Casey Clines, MD;  Location: [place];  Service: Urology;  Laterality: Right;   EXTRACORPOREAL SHOCK WAVE LITHOTRIPSY Right 09/20/2015   LEFT HEART CATH AND CORONARY ANGIOGRAPHY N/A 11/14/2018   Procedure: LEFT HEART CATH AND CORONARY ANGIOGRAPHY;  Surgeon: Casey Crome, MD;  Location: [place] [facility];  Service: Cardiovascular;  Laterality: N/A;   ROTATOR CUFF REPAIR Right 2013 or 2014??   TONSILLECTOMY  2003   TUBAL LIGATION  1992   VAGINAL HYSTERECTOMY  2005    Social History   Socioeconomic History   Marital status: Married    Spouse name: Denyse Mason   Number of children: 2   Years of education: 14   Highest education level: Associate degree: occupational, Hotel manager, or vocational program  Occupational History   Not on file  Tobacco Use   Smoking status: Former    Packs/day: 0.50    Years: 15.00    Pack years: 7.50    Types: Cigarettes    Quit date: 11/14/2018    Years since quitting: 2.7   Smokeless tobacco: Never  Vaping Use   Vaping Use: Never used  Substance and Sexual Activity   Alcohol use: Yes    Alcohol/week: 0.0 standard drinks    Comment: occasional   Drug use: No   Sexual activity: Not on file  Other Topics Concern   Not on file  Social History Narrative   Not on file   Social Determinants of Health   Financial Resource Strain: Not on file  Food Insecurity: Not on file  Transportation Needs: Not on file  Physical Activity: Not on file  Stress: Not on file  Social Connections: Not on file    Family History  Problem Relation Age of Onset   Anemia Mother    Emphysema Mother    Osteoporosis Mother    Stomach cancer Neg Hx    Esophageal cancer Neg Hx     Outpatient Encounter Medications as of 08/21/2021  Medication Sig   insulin glargine (LANTUS SOLOSTAR) 100 UNIT/ML Solostar Pen Inject 14 Units into the skin at bedtime.   insulin lispro (HUMALOG KWIKPEN) 100 UNIT/ML KwikPen Inject 1-3 Units into the skin 3 (three) times daily.   Insulin Pen Needle (PEN NEEDLES) 32G X 5 MM MISC Use to inject insulin 4 times daily   atorvastatin (LIPITOR) 40 MG tablet Take 1 tablet (40 mg total) by mouth daily.   Cholecalciferol (VITAMIN D3) 2000 units TABS Take 2 tablets by mouth daily.    Cinnamon 500 MG capsule Take 1,000 mg  by mouth daily.    clopidogrel (PLAVIX) 75 MG tablet Take 1 tablet (75 mg total) by mouth daily.   Coenzyme Q10 100 MG capsule Take 100 mg by mouth daily.   empagliflozin (JARDIANCE) 25 MG TABS tablet Take 25 mg by mouth daily.   ibandronate (BONIVA) 150 MG tablet Take 150 mg by mouth every 30 (thirty) days.   Lancets (ONETOUCH DELICA PLUS [id number]) MISC 2 (two) times daily Use as instructed.   metFORMIN (GLUCOPHAGE) 500 MG tablet Take 1,000 mg by mouth daily after breakfast.   Multiple Vitamin (MULTIVITAMIN) tablet Take 1 tablet by mouth daily.   nitroGLYCERIN (NITROSTAT) 0.4 MG SL tablet PLACE 1 TABLET (0.4 MG TOTAL) UNDER THE TONGUE EVERY 5 (FIVE) MINUTES X 3 DOSES AS NEEDED FOR CHEST PAIN.   ONETOUCH ULTRA test strip TEST TWICE DAILY E11.65   OVER THE COUNTER MEDICATION daily. Berberine Gluco Gold Supplement   OVER THE COUNTER MEDICATION Take by mouth daily. Ceylom Cinnamon Supplement   TRULICITY 1.5 ST/4.1DQ SOPN once a week.   vitamin B-12 (CYANOCOBALAMIN) 100 MCG tablet Take 100 mcg by mouth daily.   No facility-administered encounter medications on file as of 08/21/2021.    ALLERGIES: Allergies  Allergen Reactions   Diphenhydramine-Acetaminophen Hives   Oxycodone Itching   Sulfa Antibiotics Itching   Sulfur     Other reaction(s): Unknown    VACCINATION STATUS: Immunization History  Administered Date(s) Administered   PFIZER(Purple Top)SARS-COV-2 Vaccination 12/29/2019    Diabetes She presents for her follow-up diabetic visit. She has type 1 (recently reclassified as LADA) diabetes mellitus. Onset time: diagnosed at approx age of 4. Her disease course has been stable. There are no hypoglycemic associated symptoms. (Ankle swelling) There are no hypoglycemic complications. Symptoms are stable. Diabetic complications include heart disease. Risk factors for coronary artery disease include diabetes mellitus, dyslipidemia, family history, hypertension and tobacco exposure.  Current diabetic treatment includes oral agent (dual therapy) (Has restarted taking her Trulicity 1.5 injections due to high readings). She is compliant with treatment most of the time. Her weight is stable. She is following a generally healthy diet. Meal planning includes avoidance of concentrated sweets. She has had a previous visit with a dietitian (has seen dietician in the past). She participates in exercise intermittently. Her home blood glucose trend is fluctuating minimally. (She presents today to discuss her recent diagnosis with LADA.  She continues to have fluctuations in glucose but no overall improvement.  This has been a difficult transition for her as  it will change her daily routine somewhat. ) An ACE inhibitor/angiotensin II receptor blocker is not being taken. She does not see a podiatrist.Eye exam is current.  Hyperlipidemia This is a chronic problem. The current episode started more than 1 year ago. The problem is controlled. Recent lipid tests were reviewed and are normal. Exacerbating diseases include diabetes. There are no known factors aggravating her hyperlipidemia. Current antihyperlipidemic treatment includes statins. The current treatment provides moderate improvement of lipids. There are no compliance problems.  Risk factors for coronary artery disease include diabetes mellitus, dyslipidemia and family history.    Review of systems  Constitutional: + Minimally fluctuating body weight,  current Body mass index is 21.33 kg/m. , no fatigue, no subjective hyperthermia, no subjective hypothermia Eyes: no blurry vision, no xerophthalmia ENT: no sore throat, no nodules palpated in throat, no dysphagia/odynophagia, no hoarseness Cardiovascular: no chest pain, no shortness of breath, no palpitations, + intermittent ankle swelling Respiratory: no cough, no shortness of breath Gastrointestinal: no nausea/vomiting/diarrhea Musculoskeletal: no muscle/joint aches Skin: no rashes, no  hyperemia Neurological: no tremors, no numbness, no tingling, no dizziness Psychiatric: no depression, some anxiety r/t new LADA diagnosis  Objective:     BP 110/76   Pulse 80   Ht 5' 4.5" (1.638 m)   Wt 126 lb 3.2 oz (57.2 kg)   BMI 21.33 kg/m   Wt Readings from Last 3 Encounters:  08/21/21 126 lb 3.2 oz (57.2 kg)  08/06/21 128 lb (58.1 kg)  07/26/21 125 lb 6.4 oz (56.9 kg)     BP Readings from Last 3 Encounters:  08/21/21 110/76  08/06/21 111/73  07/26/21 98/68     Physical Exam- Limited  Constitutional:  Body mass index is 21.33 kg/m. , not in acute distress, flat affect Eyes:  EOMI, no exophthalmos Neck: Supple Cardiovascular: RRR, no murmurs, rubs, or gallops Respiratory: Adequate breathing efforts, no crackles, rales, rhonchi, or wheezing Musculoskeletal: no gross deformities, strength intact in all four extremities, no gross restriction of joint movements Skin:  no rashes, no hyperemia Neurological: no tremor with outstretched hands    CMP ( most recent) CMP     Component Value Date/Time   NA 143 07/22/2021 0754   K 4.5 07/22/2021 0754   CL 108 (H) 07/22/2021 0754   CO2 23 07/22/2021 0754   GLUCOSE 175 (H) 07/22/2021 0754   GLUCOSE 185 (H) 11/16/2018 0035   BUN 17 07/22/2021 0754   CREATININE 0.72 07/22/2021 0754   CALCIUM 9.8 07/22/2021 0754   PROT 6.3 07/22/2021 0754   ALBUMIN 4.4 07/22/2021 0754   AST 22 07/22/2021 0754   ALT 21 07/22/2021 0754   ALKPHOS 103 07/22/2021 0754   BILITOT 0.3 07/22/2021 0754   GFRNONAA 105 03/13/2021 0000   GFRAA >60 11/16/2018 0035     Diabetic Labs (most recent): Lab Results  Component Value Date   HGBA1C 10.4 08/06/2021   HGBA1C 8.1 03/13/2021   HGBA1C 6.0 (H) 11/15/2018     Lipid Panel ( most recent) Lipid Panel     Component Value Date/Time   CHOL 79 (L) 01/17/2019 0736   TRIG 86 08/29/2020 0000   HDL 33 (L) 01/17/2019 0736   CHOLHDL 2.4 01/17/2019 0736   CHOLHDL 3.4 11/15/2018 0808   VLDL 18  11/15/2018 0808   LDLCALC 35 08/29/2020 0000   LDLCALC 33 01/17/2019 0736   LABVLDL 13 01/17/2019 0736      No results found for: TSH, FREET4       Latest  Reference Range & Units 08/06/21 14:51 08/06/21 15:42  Hemoglobin A1C 4.0 - 5.6 % 10.4   ZNT8 Antibodies U/mL  <15  Anti GAD 65 Antibodies U/mL  >120 (H)  IA-2 Autoantibodies U/mL  <7.5  Type 1 Diabetes Interpretation   Comment  (H): Data is abnormally high    Assessment & Plan:   1) Latent Autoimmune Diabetes in Adults  She presents today to discuss her recent diagnosis with LADA.  She continues to have fluctuations in glucose but no overall improvement.  This has been a difficult transition for her as it will change her daily routine somewhat.   Casey Mason has currently uncontrolled symptomatic LADA (newly discovered) since 56 years of age.  -Recent labs reviewed.  - I had a long discussion with her about the progressive nature of diabetes and the pathology behind its complications. -her diabetes is complicated by CAD with MI and she remains at a high risk for more acute and chronic complications which include CAD, CVA, CKD, retinopathy, and neuropathy. These are all discussed in detail with her.  The following Lifestyle Medicine recommendations according to [facility]) were discussed and offered to patient and she agrees to start the journey:  A. Whole Foods, Plant-based plate comprising of fruits and vegetables, plant-based proteins, whole-grain carbohydrates was discussed in detail with the patient.   A list for source of those nutrients were also provided to the patient.  Patient will use only water or unsweetened tea for hydration. B.  The need to stay away from risky substances including alcohol, smoking; obtaining 7 to 9 hours of restorative sleep, at least 150 minutes of moderate intensity exercise weekly, the importance of healthy social connections,  and stress reduction  techniques were discussed. C.  A full color page of  Calorie density of various food groups per pound showing examples of each food groups was provided to the patient.  - Nutritional counseling repeated at each appointment due to patients tendency to fall back in to old habits.  - The patient admits there is a room for improvement in their diet and drink choices. -  Suggestion is made for the patient to avoid simple carbohydrates from their diet including Cakes, Sweet Desserts / Pastries, Ice Cream, Soda (diet and regular), Sweet Tea, Candies, Chips, Cookies, Sweet Pastries, Store Bought Juices, Alcohol in Excess of 1-2 drinks a day, Artificial Sweeteners, Coffee Creamer, and "Sugar-free" Products. This will help patient to have stable blood glucose profile and potentially avoid unintended weight gain.   - I encouraged the patient to switch to unprocessed or minimally processed complex starch and increased protein intake (animal or plant source), fruits, and vegetables.   - Patient is advised to stick to a routine mealtimes to eat 3 meals a day and avoid unnecessary snacks (to snack only to correct hypoglycemia).  - she will be scheduled with Jearld Fenton, RDN, CDE for diabetes education.  - I have approached her with the following individualized plan to manage her diabetes and patient agrees:   -Given her recent reclassification with LADA, insulin is the best form of treatment.  She is advised to start Lantus 14 units SQ nightly and start Humalog 1-3 units TID with meals if glucose is above 90 and she is eating (Specific instructions on how to titrate insulin dosage based on glucose readings given to patient in writing).  She demonstrated her ability to properly use the SSI to dose insulin with me today.  We also went over proper injection technique and rotation sites.  She can continue her Jardiance 25 mg po daily for now, given her cardiology recommendation (may help with her ankle swelling but  not likely to help much with glucose management).    -She is encouraged to continue monitoring blood glucose 4 times daily, before meals and before bed, and to call the clinic if she has readings less than 70 or above 200 for 3 tests in a row.  She had CGM in the past but did not like it due to the alarms, however now that she is on insulin treatment, we discussed the pros of having such a device.  She still has some equipment at home.  - she is not an ideal candidate for incretin therapy due to body habitus.  - Specific targets for  A1c; LDL, HDL, and Triglycerides were discussed with the patient.  2) Blood Pressure /Hypertension:  her blood pressure is controlled to target without the use of antihypertensive medications.    3) Lipids/Hyperlipidemia:    Review of her recent lipid panel from 08/29/20 showed controlled LDL at 35 .  she is advised to continue Lipitor 40 mg daily at bedtime.  Side effects and precautions discussed with her.  4)  Weight/Diet:  her Body mass index is 21.33 kg/m.  -    she is NOT a candidate for weight loss. Exercise, and detailed carbohydrates information provided  -  detailed on discharge instructions.  5) Hypercalcemia: She is known to have several instances of elevated blood calcium levels.  She also has history of osteoporosis.  She is on calcium supplement and eats calcium rich foods routinely.  She may benefit from further work up to rule out parathyroid involvement.  We discussed this today.  I added some additional blood work to assess this further.  Her blood work shows normal parathyroid function and calcium levels.  No further work up needed.  6) Chronic Care/Health Maintenance: -she is not on ACEI/ARB and is on Statin medications and is encouraged to initiate and continue to follow up with Ophthalmology, Dentist, Podiatrist at least yearly or according to recommendations, and advised to stay away from smoking (quit a very long time ago). I have recommended  yearly flu vaccine and pneumonia vaccine at least every 5 years; moderate intensity exercise for up to 150 minutes weekly; and sleep for at least 7 hours a day.  - she is advised to maintain close follow up with Casey Percy, MD for primary care needs, as well as her other providers for optimal and coordinated care.      I spent 40 minutes in the care of the patient today including review of labs from [place], Lipids, Thyroid Function, Hematology (current and previous including abstractions from other facilities); face-to-face time discussing  her blood glucose readings/logs, discussing hypoglycemia and hyperglycemia episodes and symptoms, medications doses, her options of short and long term treatment based on the latest standards of care / guidelines;  discussion about incorporating lifestyle medicine;  and documenting the encounter.    Please refer to Patient Instructions for Blood Glucose Monitoring and Insulin/Medications Dosing Guide"  in media tab for additional information. Please  also refer to " Patient Self Inventory" in the Media  tab for reviewed elements of pertinent patient history.  Casey Mason participated in the discussions, expressed understanding, and voiced agreement with the above plans.  All questions were answered to her satisfaction. she is encouraged to contact clinic should she  have any questions or concerns prior to her return visit.     Follow up plan: - Return in about 2 weeks (around 09/04/2021) for Diabetes F/U, Bring meter and logs, Virtual visit ok.    Rayetta Pigg, [facility] [address] [place], [place] [address] Phone: [phone] Fax: [phone]  08/21/2021, 3:48 PM

## 2021-08-23 ENCOUNTER — Telehealth: Payer: Self-pay | Admitting: Nurse Practitioner

## 2021-08-23 NOTE — Telephone Encounter (Signed)
If patient is below 90 does she skip her insulin and above 90 take her insulin? She is just clarifying this. Her sugar this morning was 114 as soon as she woke up.

## 2021-08-23 NOTE — Telephone Encounter (Signed)
Patient made aware.

## 2021-08-23 NOTE — Telephone Encounter (Signed)
She will skip her short-acting insulin if her glucose is below 90.  She will only take 1 unit with her breakfast since her glucose was 114 this morning.

## 2021-08-27 ENCOUNTER — Encounter: Payer: Self-pay | Admitting: Interventional Cardiology

## 2021-08-27 ENCOUNTER — Telehealth: Payer: 59 | Admitting: Nurse Practitioner

## 2021-08-27 DIAGNOSIS — R600 Localized edema: Secondary | ICD-10-CM

## 2021-08-29 ENCOUNTER — Telehealth: Payer: Self-pay | Admitting: *Deleted

## 2021-08-29 NOTE — Telephone Encounter (Signed)
   Pre-operative Risk Assessment    Patient Name: Casey Mason  DOB: 03/23/1966 MRN: [id number]      Request for Surgical Clearance    Procedure:   COLONOSCOPY  Date of Surgery:  Clearance 09/27/21                                 Surgeon:  SURGEON NOT LISTED Surgeon's Group or Practice Name:  [facility] Phone number:  [phone] Fax number:  [phone]   Type of Clearance Requested:   - Medical  - Pharmacy:  Hold Clopidogrel (Plavix) x 5 DAYS PRIOR   Type of Anesthesia:   PROPOFOL   Additional requests/questions:    Jiles Prows   08/29/2021, 11:08 AM

## 2021-08-30 ENCOUNTER — Other Ambulatory Visit: Payer: 59 | Admitting: *Deleted

## 2021-08-30 DIAGNOSIS — R600 Localized edema: Secondary | ICD-10-CM

## 2021-08-30 NOTE — Telephone Encounter (Signed)
Dr.Smith we have been asked to hold Plavix for 5 days prior to colonoscopy. This is a 56 year old with PMH of CAD with anterior STEMI and LAD stent 11/14/2018, DM II, HTN, HLD.She was last seen on 07/26/2021 and was doing well with no new cardiac complaints.  May he hold Plavix prior to procedure?  Please route reply to preop and thanks for your time.   Ambrose Pancoast, NP

## 2021-08-31 LAB — PRO B NATRIURETIC PEPTIDE: NT-Pro BNP: 61 pg/mL (ref 0–287)

## 2021-09-02 NOTE — Telephone Encounter (Signed)
   Name: Casey Mason  DOB: September 15, 1965  MRN: [id number]   Primary Cardiologist: [name], MD  Chart reviewed as part of pre-operative protocol coverage. Patient was contacted 09/02/2021 in reference to pre-operative risk assessment for pending surgery as outlined below.  RAMSHA LONIGRO was last seen on 07/26/2021 by Dr. Daneen Schick.  Since that day, AMORY ZBIKOWSKI has done well without exertional chest pain or worsening dyspnea.  Therefore, based on ACC/AHA guidelines, the patient would be at acceptable risk for the planned procedure without further cardiovascular testing.   Patient may hold Plavix for 5 days prior to the procedure and restart as soon as possible afterward at the GI doctor's discretion.  The patient was advised that if she develops new symptoms prior to surgery to contact our office to arrange for a follow-up visit, and she verbalized understanding.  I will route this recommendation to the requesting party via Epic fax function and remove from pre-op pool. Please call with questions.  [place], [place] 09/02/2021, 6:04 PM

## 2021-09-03 ENCOUNTER — Ambulatory Visit (HOSPITAL_COMMUNITY)
Admission: RE | Admit: 2021-09-03 | Discharge: 2021-09-03 | Disposition: A | Discharge status: Home / Self Care | Payer: 59 | Source: Ambulatory Visit | Attending: Interventional Cardiology | Admitting: Interventional Cardiology

## 2021-09-03 DIAGNOSIS — R600 Localized edema: Secondary | ICD-10-CM | POA: Diagnosis present

## 2021-09-04 ENCOUNTER — Encounter: Payer: Self-pay | Admitting: Nurse Practitioner

## 2021-09-04 ENCOUNTER — Telehealth: Payer: 59 | Admitting: Nurse Practitioner

## 2021-09-04 DIAGNOSIS — E139 Other specified diabetes mellitus without complications: Secondary | ICD-10-CM | POA: Diagnosis not present

## 2021-09-04 DIAGNOSIS — E782 Mixed hyperlipidemia: Secondary | ICD-10-CM | POA: Diagnosis not present

## 2021-09-04 MED ORDER — LANTUS SOLOSTAR 100 UNIT/ML ~~LOC~~ SOPN: 12.0000 [IU] | PEN_INJECTOR | Freq: Every day | SUBCUTANEOUS | 3 refills | Status: DC

## 2021-09-04 MED ORDER — ONETOUCH DELICA PLUS LANCET30G MISC
6 refills | Status: DC
Start: 2021-09-04 — End: 2022-05-27

## 2021-09-04 MED ORDER — INSULIN LISPRO (1 UNIT DIAL) 100 UNIT/ML (KWIKPEN): 0.0000 [IU] | PEN_INJECTOR | Freq: Three times a day (TID) | SUBCUTANEOUS | 3 refills | Status: DC

## 2021-09-04 MED ORDER — ONETOUCH ULTRA VI STRP: ORAL_STRIP | 6 refills | Status: DC

## 2021-09-04 NOTE — Progress Notes (Signed)
Endocrinology Follow Up Note       09/04/2021, 3:12 PM   TELEHEALTH VISIT: The patient is being engaged in telehealth visit.  This type of visit limits physical examination significantly, and thus is not preferable over face-to-face encounters.  I connected with  Casey Mason on 09/04/21 by a video enabled telemedicine application and verified that I am speaking with the correct person using two identifiers.   I discussed the limitations of evaluation and management by telemedicine. The patient expressed understanding and agreed to proceed.   The participants involved in this visit include: Brita Romp, NP located at [facility] and Casey Mason  located at their personal residence listed.  Subjective:    Patient ID: Casey Mason, female    DOB: February 19, 1966.  Casey Mason is being seen in follow up after being seen in consultation for management of currently uncontrolled symptomatic diabetes requested by  [name], MD.   Past Medical History:  Diagnosis Date   Diabetes mellitus without complication [facility])    Diverticulosis of colon    Family history of adverse reaction to anesthesia    mother-- ponv   History of colon polyps    2016- hyperplastic   History of diverticulitis of colon    Nephrolithiasis    right   PONV (postoperative nausea and vomiting)    Prolapse of female bladder, acquired    Right ureteral stone     Past Surgical History:  Procedure Laterality Date   [place]   COLONOSCOPY  11-10-2014  in [place] 11/14/2018   Procedure: CORONARY STENT INTERVENTION;  Surgeon: Belva Crome, MD;  Location: [place] [facility];  Service: Cardiovascular;  Laterality: N/A;   CORONARY/GRAFT ACUTE MI REVASCULARIZATION N/A 11/14/2018   Procedure: Coronary/Graft Acute MI Revascularization;  Surgeon: Belva Crome, MD;  Location: [place] [facility];  Service: Cardiovascular;  Laterality: N/A;   CYSTOSCOPY WITH RETROGRADE PYELOGRAM, URETEROSCOPY AND STENT PLACEMENT Right 02/07/2016   Procedure: CYSTOSCOPY WITH RIGHT PYLEOGRAM  RIGHT URETEROSCOPY, , DIALATION OF RIGHT URETER STRICUTURE FLEXIBLE RIGHT URETEROSCOPY, RIGHT PYELOSCOPY,  AND RIGHT DOUBLE STENT PLACEMENT WITH TETHER;  Surgeon: Carolan Clines, MD;  Location: [place];  Service: Urology;  Laterality: Right;   EXTRACORPOREAL SHOCK WAVE LITHOTRIPSY Right 09/20/2015   LEFT HEART CATH AND CORONARY ANGIOGRAPHY N/A 11/14/2018   Procedure: LEFT HEART CATH AND CORONARY ANGIOGRAPHY;  Surgeon: Belva Crome, MD;  Location: [place] [facility];  Service: Cardiovascular;  Laterality: N/A;   ROTATOR CUFF REPAIR Right 2013 or 2014??   TONSILLECTOMY  2003   TUBAL LIGATION  1992   VAGINAL HYSTERECTOMY  2005    Social History   Socioeconomic History   Marital status: Married    Spouse name: Denyse Amass   Number of children: 2   Years of education: 14   Highest education level: Associate degree: occupational, Hotel manager, or vocational program  Occupational History   Not on file  Tobacco Use   Smoking status: Former    Packs/day: 0.50    Years: 15.00    Pack years:  7.50    Types: Cigarettes    Quit date: 11/14/2018    Years since quitting: 2.8   Smokeless tobacco: Never  Vaping Use   Vaping Use: Never used  Substance and Sexual Activity   Alcohol use: Yes    Alcohol/week: 0.0 standard drinks    Comment: occasional   Drug use: No   Sexual activity: Not on file  Other Topics Concern   Not on file  Social History Narrative   Not on file   Social Determinants of Health   Financial Resource Strain: Not on file  Food Insecurity: Not on file  Transportation Needs: Not on file  Physical Activity: Not on file  Stress: Not on file  Social Connections: Not on file    Family History  Problem Relation Age of Onset   Anemia  Mother    Emphysema Mother    Osteoporosis Mother    Stomach cancer Neg Hx    Esophageal cancer Neg Hx     Outpatient Encounter Medications as of 09/04/2021  Medication Sig   atorvastatin (LIPITOR) 40 MG tablet Take 1 tablet (40 mg total) by mouth daily.   Cholecalciferol (VITAMIN D3) 2000 units TABS Take 2 tablets by mouth daily.    Cinnamon 500 MG capsule Take 1,000 mg by mouth daily.    clopidogrel (PLAVIX) 75 MG tablet Take 1 tablet (75 mg total) by mouth daily.   Coenzyme Q10 100 MG capsule Take 100 mg by mouth daily.   empagliflozin (JARDIANCE) 25 MG TABS tablet Take 25 mg by mouth daily.   ibandronate (BONIVA) 150 MG tablet Take 150 mg by mouth every 30 (thirty) days.   insulin glargine (LANTUS SOLOSTAR) 100 UNIT/ML Solostar Pen Inject 12 Units into the skin at bedtime.   insulin lispro (HUMALOG KWIKPEN) 100 UNIT/ML KwikPen Inject 0-6 Units into the skin 3 (three) times daily.   Insulin Pen Needle (PEN NEEDLES) 32G X 5 MM MISC Use to inject insulin 4 times daily   Lancets (ONETOUCH DELICA PLUS [id number]) MISC Use to monitor glucose 4 times daily.   metFORMIN (GLUCOPHAGE) 500 MG tablet Take 1,000 mg by mouth daily after breakfast.   Multiple Vitamin (MULTIVITAMIN) tablet Take 1 tablet by mouth daily.   nitroGLYCERIN (NITROSTAT) 0.4 MG SL tablet PLACE 1 TABLET (0.4 MG TOTAL) UNDER THE TONGUE EVERY 5 (FIVE) MINUTES X 3 DOSES AS NEEDED FOR CHEST PAIN.   ONETOUCH ULTRA test strip Use as instructed to monitor glucose 4 times daily.   OVER THE COUNTER MEDICATION daily. Berberine Gluco Gold Supplement   OVER THE COUNTER MEDICATION Take by mouth daily. Ceylom Cinnamon Supplement   vitamin B-12 (CYANOCOBALAMIN) 100 MCG tablet Take 100 mcg by mouth daily.   [DISCONTINUED] insulin glargine (LANTUS SOLOSTAR) 100 UNIT/ML Solostar Pen Inject 14 Units into the skin at bedtime.   [DISCONTINUED] insulin lispro (HUMALOG KWIKPEN) 100 UNIT/ML KwikPen Inject 1-3 Units into the skin 3 (three) times daily.    [DISCONTINUED] Lancets (ONETOUCH DELICA PLUS [id number]) MISC 2 (two) times daily Use as instructed.   [DISCONTINUED] ONETOUCH ULTRA test strip TEST TWICE DAILY E11.65   [DISCONTINUED] TRULICITY 1.5 JA/2.5KN SOPN once a week.   No facility-administered encounter medications on file as of 09/04/2021.    ALLERGIES: Allergies  Allergen Reactions   Diphenhydramine-Acetaminophen Hives   Oxycodone Itching   Sulfa Antibiotics Itching   Sulfur     Other reaction(s): Unknown    VACCINATION STATUS: Immunization History  Administered Date(s) Administered   PFIZER(Purple Top)SARS-COV-2 Vaccination 12/29/2019  Diabetes She presents for her follow-up diabetic visit. She has type 1 (recently reclassified as LADA) diabetes mellitus. Onset time: diagnosed at approx age of 73. Her disease course has been improving. There are no hypoglycemic associated symptoms. (Ankle swelling) There are no hypoglycemic complications. Symptoms are stable. Diabetic complications include heart disease. Risk factors for coronary artery disease include diabetes mellitus, dyslipidemia, family history, hypertension and tobacco exposure. Current diabetic treatment includes intensive insulin program and oral agent (monotherapy). She is compliant with treatment most of the time. Her weight is stable. She is following a generally healthy diet. Meal planning includes avoidance of concentrated sweets. She has had a previous visit with a dietitian (has seen dietician in the past). She participates in exercise intermittently. Her home blood glucose trend is fluctuating minimally. Her breakfast blood glucose range is generally 140-180 mg/dl. Her lunch blood glucose range is generally 140-180 mg/dl. Her dinner blood glucose range is generally 140-180 mg/dl. Her bedtime blood glucose range is generally >200 mg/dl. (She presents today for her virtual visit with her logs showing above target glycemic profile overall.  She has adjusted well to  the change to basal/bolus insulin given reclassification as LADA.  She denies any significant hypoglycemia.) An ACE inhibitor/angiotensin II receptor blocker is not being taken. She does not see a podiatrist.Eye exam is current.  Hyperlipidemia This is a chronic problem. The current episode started more than 1 year ago. The problem is controlled. Recent lipid tests were reviewed and are normal. Exacerbating diseases include diabetes. There are no known factors aggravating her hyperlipidemia. Current antihyperlipidemic treatment includes statins. The current treatment provides moderate improvement of lipids. There are no compliance problems.  Risk factors for coronary artery disease include diabetes mellitus, dyslipidemia and family history.    Review of systems  Constitutional: + Minimally fluctuating body weight,  current There is no height or weight on file to calculate BMI. , no fatigue, no subjective hyperthermia, no subjective hypothermia Eyes: no blurry vision, no xerophthalmia ENT: no sore throat, no nodules palpated in throat, no dysphagia/odynophagia, no hoarseness Cardiovascular: no chest pain, no shortness of breath, no palpitations, + intermittent ankle swelling- had doppler yesterday which did not show any clots Respiratory: no cough, no shortness of breath Gastrointestinal: no nausea/vomiting/diarrhea Musculoskeletal: no muscle/joint aches Skin: no rashes, no hyperemia Neurological: no tremors, no numbness, no tingling, no dizziness Psychiatric: no depression, some anxiety r/t new LADA diagnosis  Objective:     There were no vitals taken for this visit.  Wt Readings from Last 3 Encounters:  08/21/21 126 lb 3.2 oz (57.2 kg)  08/06/21 128 lb (58.1 kg)  07/26/21 125 lb 6.4 oz (56.9 kg)     BP Readings from Last 3 Encounters:  08/21/21 110/76  08/06/21 111/73  07/26/21 98/68     Physical Exam- Telehealth- significantly limited due to nature of visit  Constitutional:  There is no height or weight on file to calculate BMI. , not in acute distress, normal state of mind Respiratory: Adequate breathing efforts    CMP ( most recent) CMP     Component Value Date/Time   NA 143 07/22/2021 0754   K 4.5 07/22/2021 0754   CL 108 (H) 07/22/2021 0754   CO2 23 07/22/2021 0754   GLUCOSE 175 (H) 07/22/2021 0754   GLUCOSE 185 (H) 11/16/2018 0035   BUN 17 07/22/2021 0754   CREATININE 0.72 07/22/2021 0754   CALCIUM 9.8 07/22/2021 0754   PROT 6.3 07/22/2021 0754   ALBUMIN 4.4 07/22/2021  0754   AST 22 07/22/2021 0754   ALT 21 07/22/2021 0754   ALKPHOS 103 07/22/2021 0754   BILITOT 0.3 07/22/2021 0754   GFRNONAA 105 03/13/2021 0000   GFRAA >60 11/16/2018 0035     Diabetic Labs (most recent): Lab Results  Component Value Date   HGBA1C 10.4 08/06/2021   HGBA1C 8.1 03/13/2021   HGBA1C 6.0 (H) 11/15/2018     Lipid Panel ( most recent) Lipid Panel     Component Value Date/Time   CHOL 79 (L) 01/17/2019 0736   TRIG 86 08/29/2020 0000   HDL 33 (L) 01/17/2019 0736   CHOLHDL 2.4 01/17/2019 0736   CHOLHDL 3.4 11/15/2018 0808   VLDL 18 11/15/2018 0808   LDLCALC 35 08/29/2020 0000   LDLCALC 33 01/17/2019 0736   LABVLDL 13 01/17/2019 0736      No results found for: TSH, FREET4       Latest Reference Range & Units 08/06/21 14:51 08/06/21 15:42  Hemoglobin A1C 4.0 - 5.6 % 10.4   ZNT8 Antibodies U/mL  <15  Anti GAD 65 Antibodies U/mL  >120 (H)  IA-2 Autoantibodies U/mL  <7.5  Type 1 Diabetes Interpretation   Comment  (H): Data is abnormally high    Assessment & Plan:   1) Latent Autoimmune Diabetes in Adults  She presents today for her virtual visit with her logs showing above target glycemic profile overall.  She has adjusted well to the change to basal/bolus insulin given reclassification as LADA.  She denies any significant hypoglycemia.  Casey Mason has currently uncontrolled symptomatic LADA (newly discovered) since 56 years of  age.  -Recent labs reviewed.  - I had a long discussion with her about the progressive nature of diabetes and the pathology behind its complications. -her diabetes is complicated by CAD with MI and she remains at a high risk for more acute and chronic complications which include CAD, CVA, CKD, retinopathy, and neuropathy. These are all discussed in detail with her.  The following Lifestyle Medicine recommendations according to [facility]) were discussed and offered to patient and she agrees to start the journey:  A. Whole Foods, Plant-based plate comprising of fruits and vegetables, plant-based proteins, whole-grain carbohydrates was discussed in detail with the patient.   A list for source of those nutrients were also provided to the patient.  Patient will use only water or unsweetened tea for hydration. B.  The need to stay away from risky substances including alcohol, smoking; obtaining 7 to 9 hours of restorative sleep, at least 150 minutes of moderate intensity exercise weekly, the importance of healthy social connections,  and stress reduction techniques were discussed. C.  A full color page of  Calorie density of various food groups per pound showing examples of each food groups was provided to the patient.  - Nutritional counseling repeated at each appointment due to patients tendency to fall back in to old habits.  - The patient admits there is a room for improvement in their diet and drink choices. -  Suggestion is made for the patient to avoid simple carbohydrates from their diet including Cakes, Sweet Desserts / Pastries, Ice Cream, Soda (diet and regular), Sweet Tea, Candies, Chips, Cookies, Sweet Pastries, Store Bought Juices, Alcohol in Excess of 1-2 drinks a day, Artificial Sweeteners, Coffee Creamer, and "Sugar-free" Products. This will help patient to have stable blood glucose profile and potentially avoid unintended weight gain.   - I encouraged  the patient  to switch to unprocessed or minimally processed complex starch and increased protein intake (animal or plant source), fruits, and vegetables.   - Patient is advised to stick to a routine mealtimes to eat 3 meals a day and avoid unnecessary snacks (to snack only to correct hypoglycemia).  - she will be scheduled with Jearld Fenton, RDN, CDE for diabetes education.  - I have approached her with the following individualized plan to manage her diabetes and patient agrees:   -She is advised to lower her Lantus slightly to 12 units SQ nightly (given she wakes with mild hypoglycemia at times) and adjust her Humalog SSI to 0-6 units TID with meals is glucose is above 90 and she is eating (Specific instructions on how to titrate insulin dosage based on glucose readings given to patient in writing- uploaded via [place]).   She can continue her Jardiance 25 mg po daily for now, given her cardiology recommendation (may help with her ankle swelling but not likely to help much with glucose management).    -She is encouraged to continue monitoring blood glucose 4 times daily, before meals and before bed, and to call the clinic if she has readings less than 70 or above 200 for 3 tests in a row.  She had CGM in the past but did not like it due to the alarms, however now that she is on insulin treatment, we discussed the pros of having such a device.  She still has some equipment at home.  - she is not an ideal candidate for incretin therapy due to body habitus.  - Specific targets for  A1c; LDL, HDL, and Triglycerides were discussed with the patient.  2) Blood Pressure /Hypertension:  her blood pressure is controlled to target without the use of antihypertensive medications.    3) Lipids/Hyperlipidemia:    Review of her recent lipid panel from 08/29/20 showed controlled LDL at 35 .  she is advised to continue Lipitor 40 mg daily at bedtime.  Side effects and precautions discussed with her.  4)   Weight/Diet:  her There is no height or weight on file to calculate BMI.  -    she is NOT a candidate for weight loss. Exercise, and detailed carbohydrates information provided  -  detailed on discharge instructions.  5) Hypercalcemia: She is known to have several instances of elevated blood calcium levels.  She also has history of osteoporosis.  She is on calcium supplement and eats calcium rich foods routinely.  She may benefit from further work up to rule out parathyroid involvement.  We discussed this today.  I added some additional blood work to assess this further.  Her blood work shows normal parathyroid function and calcium levels.  No further work up needed.  6) Chronic Care/Health Maintenance: -she is not on ACEI/ARB and is on Statin medications and is encouraged to initiate and continue to follow up with Ophthalmology, Dentist, Podiatrist at least yearly or according to recommendations, and advised to stay away from smoking (quit a very long time ago). I have recommended yearly flu vaccine and pneumonia vaccine at least every 5 years; moderate intensity exercise for up to 150 minutes weekly; and sleep for at least 7 hours a day.  - she is advised to maintain close follow up with [name], MD for primary care needs, as well as her other providers for optimal and coordinated care.        I spent 30 dedicated to the care of this patient on  the date of this encounter to include pre-visit review of records, face-to-face time with the patient, and post visit ordering of  testing.    Please refer to Patient Instructions for Blood Glucose Monitoring and Insulin/Medications Dosing Guide"  in media tab for additional information. Please  also refer to " Patient Self Inventory" in the Media  tab for reviewed elements of pertinent patient history.  Casey Mason participated in the discussions, expressed understanding, and voiced agreement with the above plans.  All questions were answered  to her satisfaction. she is encouraged to contact clinic should she have any questions or concerns prior to her return visit.     Follow up plan: - Return in about 2 months (around 11/04/2021) for Diabetes F/U with A1c in office, No previsit labs, Bring meter and logs.   Rayetta Pigg, [facility] [address] [place], [place] [address] Phone: [phone] Fax: [phone]  09/04/2021, 3:12 PM

## 2021-09-06 ENCOUNTER — Other Ambulatory Visit: Payer: Self-pay | Admitting: Interventional Cardiology

## 2021-10-10 ENCOUNTER — Encounter: Payer: Self-pay | Admitting: Interventional Cardiology

## 2021-10-10 DIAGNOSIS — R600 Localized edema: Secondary | ICD-10-CM

## 2021-10-21 ENCOUNTER — Encounter: Payer: Self-pay | Admitting: Nurse Practitioner

## 2021-10-21 NOTE — Addendum Note (Signed)
Addended by: Molli Barrows on: 10/21/2021 01:07 PM   Modules accepted: Orders

## 2021-11-05 ENCOUNTER — Ambulatory Visit: Payer: 59 | Admitting: Nurse Practitioner

## 2021-11-25 ENCOUNTER — Encounter: Payer: Self-pay | Admitting: Nurse Practitioner

## 2021-11-25 ENCOUNTER — Ambulatory Visit: Payer: 59 | Admitting: Nurse Practitioner

## 2021-11-25 VITALS — BP 109/63 | HR 68 | Ht 64.5 in | Wt 143.2 lb

## 2021-11-25 DIAGNOSIS — E139 Other specified diabetes mellitus without complications: Secondary | ICD-10-CM

## 2021-11-25 DIAGNOSIS — E782 Mixed hyperlipidemia: Secondary | ICD-10-CM | POA: Diagnosis not present

## 2021-11-25 LAB — POCT GLYCOSYLATED HEMOGLOBIN (HGB A1C): Hemoglobin A1C: 8 % — AB (ref 4.0–5.6)

## 2021-11-25 MED ORDER — INSULIN LISPRO (1 UNIT DIAL) 100 UNIT/ML (KWIKPEN): 2.0000 [IU] | PEN_INJECTOR | Freq: Three times a day (TID) | SUBCUTANEOUS | 3 refills | Status: DC

## 2021-11-25 NOTE — Addendum Note (Signed)
Addended by: Grayland Ormond on: 11/25/2021 11:23 AM   Modules accepted: Orders

## 2021-11-25 NOTE — Progress Notes (Signed)
Endocrinology Follow Up Note       11/25/2021, 9:16 AM    Subjective:    Patient ID: Casey Mason, female    DOB: [birth year].  Casey Mason is being seen in follow up after being seen in consultation for management of currently uncontrolled symptomatic diabetes requested by  [name], MD.   Past Medical History:  Diagnosis Date   Diabetes mellitus without complication [facility])    Diverticulosis of colon    Family history of adverse reaction to anesthesia    mother-- ponv   History of colon polyps    2016- hyperplastic   History of diverticulitis of colon    Nephrolithiasis    right   PONV (postoperative nausea and vomiting)    Prolapse of female bladder, acquired    Right ureteral stone     Past Surgical History:  Procedure Laterality Date   [place]   COLONOSCOPY  11-10-2014  in [place] 11/14/2018   Procedure: CORONARY STENT INTERVENTION;  Surgeon: Belva Crome, MD;  Location: [place] [facility];  Service: Cardiovascular;  Laterality: N/A;   CORONARY/GRAFT ACUTE MI REVASCULARIZATION N/A 11/14/2018   Procedure: Coronary/Graft Acute MI Revascularization;  Surgeon: Belva Crome, MD;  Location: [place] [facility];  Service: Cardiovascular;  Laterality: N/A;   CYSTOSCOPY WITH RETROGRADE PYELOGRAM, URETEROSCOPY AND STENT PLACEMENT Right 02/07/2016   Procedure: CYSTOSCOPY WITH RIGHT PYLEOGRAM  RIGHT URETEROSCOPY, , DIALATION OF RIGHT URETER STRICUTURE FLEXIBLE RIGHT URETEROSCOPY, RIGHT PYELOSCOPY,  AND RIGHT DOUBLE STENT PLACEMENT WITH TETHER;  Surgeon: Carolan Clines, MD;  Location: [place];  Service: Urology;  Laterality: Right;   EXTRACORPOREAL SHOCK WAVE LITHOTRIPSY Right 09/20/2015   LEFT HEART CATH AND CORONARY ANGIOGRAPHY N/A 11/14/2018   Procedure: LEFT HEART CATH AND CORONARY ANGIOGRAPHY;  Surgeon: Belva Crome, MD;  Location: [place] [facility];  Service: Cardiovascular;  Laterality: N/A;   ROTATOR CUFF REPAIR Right 2013 or 2014??   TONSILLECTOMY  2003   TUBAL LIGATION  1992   VAGINAL HYSTERECTOMY  2005    Social History   Socioeconomic History   Marital status: Married    Spouse name: Denyse Amass   Number of children: 2   Years of education: 14   Highest education level: Associate degree: occupational, Hotel manager, or vocational program  Occupational History   Not on file  Tobacco Use   Smoking status: Former    Packs/day: 0.50    Years: 15.00    Total pack years: 7.50    Types: Cigarettes    Quit date: 11/14/2018    Years since quitting: 3.0   Smokeless tobacco: Never  Vaping Use   Vaping Use: Never used  Substance and Sexual Activity   Alcohol use: Yes    Alcohol/week: 0.0 standard drinks of alcohol    Comment: occasional   Drug use: No   Sexual activity: Not on file  Other Topics Concern   Not on file  Social History Narrative   Not on file   Social Determinants of Health   Financial Resource Strain: Low Risk  (12/21/2018)  Overall Financial Resource Strain (CARDIA)    Difficulty of Paying Living Expenses: Not hard at all  Food Insecurity: No Food Insecurity (12/21/2018)   Hunger Vital Sign    Worried About Running Out of Food in the Last Year: Never true    Ran Out of Food in the Last Year: Never true  Transportation Needs: No Transportation Needs (12/21/2018)   PRAPARE - Hydrologist (Medical): No    Lack of Transportation (Non-Medical): No  Physical Activity: Sufficiently Active (12/21/2018)   Exercise Vital Sign    Days of Exercise per Week: 6 days    Minutes of Exercise per Session: 30 min  Stress: Stress Concern Present (12/21/2018)   [place]    Feeling of Stress : To some extent  Social Connections: Moderately Integrated (12/21/2018)   Social Connection and Isolation  Panel [NHANES]    Frequency of Communication with Friends and Family: More than three times a week    Frequency of Social Gatherings with Friends and Family: Never    Attends Religious Services: 1 to 4 times per year    Active Member of [employer] or Organizations: No    Attends Music therapist: Never    Marital Status: Married    Family History  Problem Relation Age of Onset   Anemia Mother    Emphysema Mother    Osteoporosis Mother    Stomach cancer Neg Hx    Esophageal cancer Neg Hx     Outpatient Encounter Medications as of 11/25/2021  Medication Sig   atorvastatin (LIPITOR) 40 MG tablet TAKE 1 TABLET BY MOUTH EVERY DAY   Cholecalciferol (VITAMIN D3) 2000 units TABS Take 2 tablets by mouth daily.    clopidogrel (PLAVIX) 75 MG tablet Take 1 tablet (75 mg total) by mouth daily.   Coenzyme Q10 100 MG capsule Take 100 mg by mouth daily.   empagliflozin (JARDIANCE) 25 MG TABS tablet Take 25 mg by mouth daily.   ibandronate (BONIVA) 150 MG tablet Take 150 mg by mouth every 30 (thirty) days.   insulin glargine (LANTUS SOLOSTAR) 100 UNIT/ML Solostar Pen Inject 12 Units into the skin at bedtime.   Insulin Pen Needle (PEN NEEDLES) 32G X 5 MM MISC Use to inject insulin 4 times daily   metFORMIN (GLUCOPHAGE) 500 MG tablet Take 1,000 mg by mouth daily after breakfast.   Multiple Vitamin (MULTIVITAMIN) tablet Take 1 tablet by mouth daily.   nitroGLYCERIN (NITROSTAT) 0.4 MG SL tablet PLACE 1 TABLET (0.4 MG TOTAL) UNDER THE TONGUE EVERY 5 (FIVE) MINUTES X 3 DOSES AS NEEDED FOR CHEST PAIN.   ONETOUCH ULTRA test strip Use as instructed to monitor glucose 4 times daily.   vitamin B-12 (CYANOCOBALAMIN) 100 MCG tablet Take 100 mcg by mouth daily.   [DISCONTINUED] insulin lispro (HUMALOG KWIKPEN) 100 UNIT/ML KwikPen Inject 0-6 Units into the skin 3 (three) times daily.   insulin lispro (HUMALOG KWIKPEN) 100 UNIT/ML KwikPen Inject 2-5 Units into the skin 3 (three) times daily.   Lancets  (ONETOUCH DELICA PLUS [id number]) MISC Use to monitor glucose 4 times daily.   [DISCONTINUED] Cinnamon 500 MG capsule Take 1,000 mg by mouth daily.    [DISCONTINUED] OVER THE COUNTER MEDICATION daily. Berberine Gluco Gold Supplement   [DISCONTINUED] OVER THE COUNTER MEDICATION Take by mouth daily. Ceylom Cinnamon Supplement   No facility-administered encounter medications on file as of 11/25/2021.    ALLERGIES: Allergies  Allergen Reactions  Diphenhydramine-Acetaminophen Hives   Oxycodone Itching   Sulfa Antibiotics Itching   Sulfur     Other reaction(s): Unknown    VACCINATION STATUS: Immunization History  Administered Date(s) Administered   PFIZER(Purple Top)SARS-COV-2 Vaccination 12/29/2019    Diabetes She presents for her follow-up diabetic visit. She has type 1 (recently reclassified as LADA) diabetes mellitus. Onset time: diagnosed at approx age of 15. Her disease course has been improving. There are no hypoglycemic associated symptoms. (Ankle swelling) There are no hypoglycemic complications. Symptoms are stable. Diabetic complications include heart disease. Risk factors for coronary artery disease include diabetes mellitus, dyslipidemia, family history, hypertension and tobacco exposure. Current diabetic treatment includes intensive insulin program and oral agent (monotherapy). She is compliant with treatment most of the time. Her weight is increasing steadily. She is following a generally healthy diet. Meal planning includes avoidance of concentrated sweets. She has had a previous visit with a dietitian (has seen dietician in the past). She participates in exercise intermittently. Her home blood glucose trend is decreasing steadily. Her breakfast blood glucose range is generally 90-110 mg/dl. Her lunch blood glucose range is generally 180-200 mg/dl. Her dinner blood glucose range is generally 130-140 mg/dl. Her bedtime blood glucose range is generally 180-200 mg/dl. (She presents  today with her meter and logs showing improving glycemic profile with at goal fasting and slightly above target postprandial readings.  Her POCT A1c today is 8%, improving from last visit of 10.4%.  She denies any hypoglycemia.  Analysis of her meter shows 7-day average of 165, 14-day average of 170, 30-day average of 173.  She has regained some weight previously lost unintentionally.  ) An ACE inhibitor/angiotensin II receptor blocker is not being taken. She does not see a podiatrist.Eye exam is current.  Hyperlipidemia This is a chronic problem. The current episode started more than 1 year ago. The problem is controlled. Recent lipid tests were reviewed and are normal. Exacerbating diseases include diabetes. There are no known factors aggravating her hyperlipidemia. Current antihyperlipidemic treatment includes statins. The current treatment provides moderate improvement of lipids. There are no compliance problems.  Risk factors for coronary artery disease include diabetes mellitus, dyslipidemia and family history.    Review of systems  Constitutional: + increasing body weight (regaining some weight previously lost unintentionally),  current Body mass index is 24.2 kg/m. , no fatigue, no subjective hyperthermia, no subjective hypothermia Eyes: no blurry vision, no xerophthalmia ENT: no sore throat, no nodules palpated in throat, no dysphagia/odynophagia, no hoarseness Cardiovascular: no chest pain, no shortness of breath, no palpitations, no leg swelling Respiratory: no cough, no shortness of breath Gastrointestinal: no nausea/vomiting/diarrhea Musculoskeletal: no muscle/joint aches Skin: no rashes, no hyperemia Neurological: no tremors, no numbness, no tingling, no dizziness Psychiatric: no depression, no anxiety  Objective:     BP 109/63 (BP Location: Right Arm, Patient Position: Sitting, Cuff Size: Normal)   Pulse 68   Ht 5' 4.5" (1.638 m)   Wt 143 lb 3.2 oz (65 kg)   BMI 24.20 kg/m    Wt Readings from Last 3 Encounters:  11/25/21 143 lb 3.2 oz (65 kg)  08/21/21 126 lb 3.2 oz (57.2 kg)  08/06/21 128 lb (58.1 kg)     BP Readings from Last 3 Encounters:  11/25/21 109/63  08/21/21 110/76  08/06/21 111/73      Physical Exam- Limited  Constitutional:  Body mass index is 24.2 kg/m. , not in acute distress, normal state of mind Eyes:  EOMI, no exophthalmos Neck: Supple  Cardiovascular: RRR, no murmurs, rubs, or gallops, no edema Respiratory: Adequate breathing efforts, no crackles, rales, rhonchi, or wheezing Musculoskeletal: no gross deformities, strength intact in all four extremities, no gross restriction of joint movements Skin:  no rashes, no hyperemia Neurological: no tremor with outstretched hands   Diabetic Foot Exam - Simple   Simple Foot Form Visual Inspection No deformities, no ulcerations, no other skin breakdown bilaterally: Yes Sensation Testing Intact to touch and monofilament testing bilaterally: Yes Pulse Check Posterior Tibialis and Dorsalis pulse intact bilaterally: Yes Comments     CMP ( most recent) CMP     Component Value Date/Time   NA 143 07/22/2021 0754   K 4.5 07/22/2021 0754   CL 108 (H) 07/22/2021 0754   CO2 23 07/22/2021 0754   GLUCOSE 175 (H) 07/22/2021 0754   GLUCOSE 185 (H) 11/16/2018 0035   BUN 17 07/22/2021 0754   CREATININE 0.72 07/22/2021 0754   CALCIUM 9.8 07/22/2021 0754   PROT 6.3 07/22/2021 0754   ALBUMIN 4.4 07/22/2021 0754   AST 22 07/22/2021 0754   ALT 21 07/22/2021 0754   ALKPHOS 103 07/22/2021 0754   BILITOT 0.3 07/22/2021 0754   GFRNONAA 105 03/13/2021 0000   GFRAA >60 11/16/2018 0035     Diabetic Labs (most recent): Lab Results  Component Value Date   HGBA1C 10.4 08/06/2021   HGBA1C 8.1 03/13/2021   HGBA1C 6.0 (H) 11/15/2018   MICROALBUR 10 05/01/2021     Lipid Panel ( most recent) Lipid Panel     Component Value Date/Time   CHOL 79 (L) 01/17/2019 0736   TRIG 86 08/29/2020 0000    HDL 33 (L) 01/17/2019 0736   CHOLHDL 2.4 01/17/2019 0736   CHOLHDL 3.4 11/15/2018 0808   VLDL 18 11/15/2018 0808   LDLCALC 35 08/29/2020 0000   LDLCALC 33 01/17/2019 0736   LABVLDL 13 01/17/2019 0736      No results found for: "TSH", "FREET4"       Latest Reference Range & Units 08/06/21 14:51 08/06/21 15:42  Hemoglobin A1C 4.0 - 5.6 % 10.4   ZNT8 Antibodies U/mL  <15  Anti GAD 65 Antibodies U/mL  >120 (H)  IA-2 Autoantibodies U/mL  <7.5  Type 1 Diabetes Interpretation   Comment  (H): Data is abnormally high    Assessment & Plan:   1) Latent Autoimmune Diabetes in Adults  She presents today with her meter and logs showing improving glycemic profile with at goal fasting and slightly above target postprandial readings.  Her POCT A1c today is 8%, improving from last visit of 10.4%.  She denies any hypoglycemia.  Analysis of her meter shows 7-day average of 165, 14-day average of 170, 30-day average of 173.  She has regained some weight previously lost unintentionally.    Casey Mason has currently uncontrolled symptomatic LADA (newly discovered) since 56 years of age.  -Recent labs reviewed.  - I had a long discussion with her about the progressive nature of diabetes and the pathology behind its complications. -her diabetes is complicated by CAD with MI and she remains at a high risk for more acute and chronic complications which include CAD, CVA, CKD, retinopathy, and neuropathy. These are all discussed in detail with her.  The following Lifestyle Medicine recommendations according to [facility]) were discussed and offered to patient and she agrees to start the journey:  A. Whole Foods, Plant-based plate comprising of fruits and vegetables, plant-based proteins, whole-grain carbohydrates was discussed  in detail with the patient.   A list for source of those nutrients were also provided to the patient.  Patient will use only water or unsweetened  tea for hydration. B.  The need to stay away from risky substances including alcohol, smoking; obtaining 7 to 9 hours of restorative sleep, at least 150 minutes of moderate intensity exercise weekly, the importance of healthy social connections,  and stress reduction techniques were discussed. C.  A full color page of  Calorie density of various food groups per pound showing examples of each food groups was provided to the patient.  - Nutritional counseling repeated at each appointment due to patients tendency to fall back in to old habits.  - The patient admits there is a room for improvement in their diet and drink choices. -  Suggestion is made for the patient to avoid simple carbohydrates from their diet including Cakes, Sweet Desserts / Pastries, Ice Cream, Soda (diet and regular), Sweet Tea, Candies, Chips, Cookies, Sweet Pastries, Store Bought Juices, Alcohol in Excess of 1-2 drinks a day, Artificial Sweeteners, Coffee Creamer, and "Sugar-free" Products. This will help patient to have stable blood glucose profile and potentially avoid unintended weight gain.   - I encouraged the patient to switch to unprocessed or minimally processed complex starch and increased protein intake (animal or plant source), fruits, and vegetables.   - Patient is advised to stick to a routine mealtimes to eat 3 meals a day and avoid unnecessary snacks (to snack only to correct hypoglycemia).  - she will be scheduled with Jearld Fenton, RDN, CDE for diabetes education.  - I have approached her with the following individualized plan to manage her diabetes and patient agrees:   -She can continue her Lantus 12 units SQ nightly and will adjust her Humalog to 2-5 units TID with meals if glucose is above 70 and she is eating (Specific instructions on how to titrate insulin dosage based on glucose readings given to patient in writing).    She can continue her Jardiance 25 mg po daily and Metformin 500 mg po twice daily  for now, given her cardiology recommendation (may help with her ankle swelling but not likely to help much with glucose management).    -She is encouraged to continue monitoring blood glucose 4 times daily, before meals and before bed, and to call the clinic if she has readings less than 70 or above 200 for 3 tests in a row.  She had CGM in the past but did not like it due to the alarms, however I gave her sample G7 today.  - she is not an ideal candidate for incretin therapy due to body habitus.  - Specific targets for  A1c; LDL, HDL, and Triglycerides were discussed with the patient.  2) Blood Pressure /Hypertension:  her blood pressure is controlled to target without the use of antihypertensive medications.    3) Lipids/Hyperlipidemia:    Review of her recent lipid panel from 08/29/20 showed controlled LDL at 35 .  she is advised to continue Lipitor 40 mg daily at bedtime.  Side effects and precautions discussed with her.  4)  Weight/Diet:  her Body mass index is 24.2 kg/m.  -    she is NOT a candidate for weight loss. Exercise, and detailed carbohydrates information provided  -  detailed on discharge instructions.  5) Hypercalcemia: She is known to have several instances of elevated blood calcium levels.  She also has history of osteoporosis.  She is  on calcium supplement and eats calcium rich foods routinely.  She may benefit from further work up to rule out parathyroid involvement.  We discussed this today.  I added some additional blood work to assess this further.  Her blood work shows normal parathyroid function and calcium levels.  No further work up needed.  6) Chronic Care/Health Maintenance: -she is not on ACEI/ARB and is on Statin medications and is encouraged to initiate and continue to follow up with Ophthalmology, Dentist, Podiatrist at least yearly or according to recommendations, and advised to stay away from smoking (quit a very long time ago). I have recommended yearly flu  vaccine and pneumonia vaccine at least every 5 years; moderate intensity exercise for up to 150 minutes weekly; and sleep for at least 7 hours a day.  - she is advised to maintain close follow up with [name], MD for primary care needs, as well as her other providers for optimal and coordinated care.       I spent 41 minutes in the care of the patient today including review of labs from [facility], Lipids, Thyroid Function, Hematology (current and previous including abstractions from other facilities); face-to-face time discussing  her blood glucose readings/logs, discussing hypoglycemia and hyperglycemia episodes and symptoms, medications doses, her options of short and long term treatment based on the latest standards of care / guidelines;  discussion about incorporating lifestyle medicine;  and documenting the encounter. Risk reduction counseling performed per USPSTF guidelines to reduce obesity and cardiovascular risk factors.     Please refer to Patient Instructions for Blood Glucose Monitoring and Insulin/Medications Dosing Guide"  in media tab for additional information. Please  also refer to " Patient Self Inventory" in the Media  tab for reviewed elements of pertinent patient history.  Casey Mason participated in the discussions, expressed understanding, and voiced agreement with the above plans.  All questions were answered to her satisfaction. she is encouraged to contact clinic should she have any questions or concerns prior to her return visit.     Follow up plan: - Return in about 3 months (around 02/25/2022) for Diabetes F/U with A1c in office, No previsit labs, Bring meter and logs.   Rayetta Pigg, [facility] [address] [place], [place] [address] Phone: [phone] Fax: [phone]  11/25/2021, 9:16 AM

## 2021-11-27 ENCOUNTER — Other Ambulatory Visit: Payer: Self-pay | Admitting: *Deleted

## 2021-11-27 DIAGNOSIS — R600 Localized edema: Secondary | ICD-10-CM

## 2021-12-05 ENCOUNTER — Encounter (HOSPITAL_COMMUNITY): Payer: 59

## 2021-12-09 ENCOUNTER — Ambulatory Visit (HOSPITAL_COMMUNITY)
Admission: RE | Admit: 2021-12-09 | Discharge: 2021-12-09 | Disposition: A | Discharge status: Home / Self Care | Payer: 59 | Source: Ambulatory Visit | Attending: Vascular Surgery | Admitting: Vascular Surgery

## 2021-12-09 DIAGNOSIS — R600 Localized edema: Secondary | ICD-10-CM | POA: Insufficient documentation

## 2021-12-09 NOTE — Progress Notes (Unsigned)
[facility] OF [place]  ASSESSMENT / PLAN: Casey Mason is a 56 y.o. female with episodic lower extremity edema.  No clear cause is found on my exam today.  She has no evidence of venous reflux on duplex ultrasound.  She has no history of deep venous thrombosis, and no chronic DVT is seen on duplex.  She has no evidence of lymphedema on clinical exam.  Recommended she continue conservative measures including compression and elevation.  She can follow-up with me as needed.  CHIEF COMPLAINT: Episodic lower extremity swelling  HISTORY OF PRESENT ILLNESS: Casey Mason is a 56 y.o. female referred to clinic for evaluation of episodic lower extremity swelling.  The patient reports the swelling will occur without rhyme or reason.  The swelling typically worsens over the course of the day and is worst at night.  By morning, the swelling is typically resolved.  She is trying conservative measures like compression.  She notices the swelling will occur when she is traveling and seated for long period of time.  VASCULAR SURGICAL HISTORY: none  VASCULAR RISK FACTORS: Negative history of stroke / transient ischemic attack. Positive history of coronary artery disease. + history of PCI.  Positive history of diabetes mellitus. Last A1c 8.4. Positive history of smoking. not actively smoking. Negative history of hypertension.  Negative history of chronic kidney disease.  Negative history of chronic obstructive pulmonary disease.  FUNCTIONAL STATUS: ECOG performance status: (0) Fully active, able to carry on all predisease performance without restriction Ambulatory status: Ambulatory within the community without limits  Past Medical History:  Diagnosis Date   Diabetes mellitus without complication ([place])    Diverticulosis of colon    Family history of adverse reaction to anesthesia    mother-- ponv   History of colon polyps    2016- hyperplastic   History of diverticulitis of colon     Nephrolithiasis    right   PONV (postoperative nausea and vomiting)    Prolapse of female bladder, acquired    Right ureteral stone     Past Surgical History:  Procedure Laterality Date   [place]   COLONOSCOPY  11-10-2014  in [place] 11/14/2018   Procedure: CORONARY STENT INTERVENTION;  Surgeon: Belva Crome, MD;  Location: [facility];  Service: Cardiovascular;  Laterality: N/A;   CORONARY/GRAFT ACUTE MI REVASCULARIZATION N/A 11/14/2018   Procedure: Coronary/Graft Acute MI Revascularization;  Surgeon: Belva Crome, MD;  Location: [facility];  Service: Cardiovascular;  Laterality: N/A;   CYSTOSCOPY WITH RETROGRADE PYELOGRAM, URETEROSCOPY AND STENT PLACEMENT Right 02/07/2016   Procedure: CYSTOSCOPY WITH RIGHT PYLEOGRAM  RIGHT URETEROSCOPY, , DIALATION OF RIGHT URETER STRICUTURE FLEXIBLE RIGHT URETEROSCOPY, RIGHT PYELOSCOPY,  AND RIGHT DOUBLE STENT PLACEMENT WITH TETHER;  Surgeon: Carolan Clines, MD;  Location: [facility];  Service: Urology;  Laterality: Right;   EXTRACORPOREAL SHOCK WAVE LITHOTRIPSY Right 09/20/2015   LEFT HEART CATH AND CORONARY ANGIOGRAPHY N/A 11/14/2018   Procedure: LEFT HEART CATH AND CORONARY ANGIOGRAPHY;  Surgeon: Belva Crome, MD;  Location: [facility];  Service: Cardiovascular;  Laterality: N/A;   ROTATOR CUFF REPAIR Right 2013 or 2014??   TONSILLECTOMY  2003   TUBAL LIGATION  1992   VAGINAL HYSTERECTOMY  2005    Family History  Problem Relation Age of Onset   Anemia Mother    Emphysema Mother    Osteoporosis  Mother    Stomach cancer Neg Hx    Esophageal cancer Neg Hx     Social History   Socioeconomic History   Marital status: Married    Spouse name: Denyse Amass   Number of children: 2   Years of education: 14   Highest education level: Associate degree: occupational, Hotel manager, or vocational program  Occupational History   Not on file   Tobacco Use   Smoking status: Former    Packs/day: 0.50    Years: 15.00    Total pack years: 7.50    Types: Cigarettes    Quit date: 11/14/2018    Years since quitting: 3.0   Smokeless tobacco: Never  Vaping Use   Vaping Use: Never used  Substance and Sexual Activity   Alcohol use: Yes    Alcohol/week: 0.0 standard drinks of alcohol    Comment: occasional   Drug use: No   Sexual activity: Not on file  Other Topics Concern   Not on file  Social History Narrative   Not on file   Social Determinants of Health   Financial Resource Strain: Low Risk  (12/21/2018)   Overall Financial Resource Strain (CARDIA)    Difficulty of Paying Living Expenses: Not hard at all  Food Insecurity: No Food Insecurity (12/21/2018)   Hunger Vital Sign    Worried About Running Out of Food in the Last Year: Never true    Ran Out of Food in the Last Year: Never true  Transportation Needs: No Transportation Needs (12/21/2018)   PRAPARE - Hydrologist (Medical): No    Lack of Transportation (Non-Medical): No  Physical Activity: Sufficiently Active (12/21/2018)   Exercise Vital Sign    Days of Exercise per Week: 6 days    Minutes of Exercise per Session: 30 min  Stress: Stress Concern Present (12/21/2018)   [place]    Feeling of Stress : To some extent  Social Connections: Moderately Integrated (12/21/2018)   Social Connection and Isolation Panel [NHANES]    Frequency of Communication with Friends and Family: More than three times a week    Frequency of Social Gatherings with Friends and Family: Never    Attends Religious Services: 1 to 4 times per year    Active Member of [employer] or Organizations: No    Attends Archivist Meetings: Never    Marital Status: Married  Human resources officer Violence: Unknown (12/21/2018)   Humiliation, Afraid, Rape, and Kick questionnaire    Fear of Current or Ex-Partner:  Patient refused    Emotionally Abused: Patient refused    Physically Abused: Patient refused    Sexually Abused: Patient refused    Allergies  Allergen Reactions   Oxycodone Itching   Sulfa Antibiotics Itching   Sulfur     Other reaction(s): Unknown    Current Outpatient Medications  Medication Sig Dispense Refill   atorvastatin (LIPITOR) 40 MG tablet TAKE 1 TABLET BY MOUTH EVERY DAY 90 tablet 3   Cholecalciferol (VITAMIN D3) 2000 units TABS Take 2 tablets by mouth daily.      clopidogrel (PLAVIX) 75 MG tablet Take 1 tablet (75 mg total) by mouth daily. 90 tablet 3   Coenzyme Q10 100 MG capsule Take 100 mg by mouth daily.     empagliflozin (JARDIANCE) 25 MG TABS tablet Take 25 mg by mouth daily.     ibandronate (BONIVA) 150 MG tablet Take 150 mg by mouth every 30 (  thirty) days.     insulin glargine (LANTUS SOLOSTAR) 100 UNIT/ML Solostar Pen Inject 12 Units into the skin at bedtime. 15 mL 3   insulin lispro (HUMALOG KWIKPEN) 100 UNIT/ML KwikPen Inject 2-5 Units into the skin 3 (three) times daily. 6 mL 3   Insulin Pen Needle (PEN NEEDLES) 32G X 5 MM MISC Use to inject insulin 4 times daily 100 each 11   Lancets (ONETOUCH DELICA PLUS [id number]) MISC Use to monitor glucose 4 times daily. 100 each 6   metFORMIN (GLUCOPHAGE) 500 MG tablet Take 1,000 mg by mouth daily after breakfast.     Multiple Vitamin (MULTIVITAMIN) tablet Take 1 tablet by mouth daily.     nitroGLYCERIN (NITROSTAT) 0.4 MG SL tablet PLACE 1 TABLET (0.4 MG TOTAL) UNDER THE TONGUE EVERY 5 (FIVE) MINUTES X 3 DOSES AS NEEDED FOR CHEST PAIN. 25 tablet 2   ONETOUCH ULTRA test strip Use as instructed to monitor glucose 4 times daily. 100 each 6   vitamin B-12 (CYANOCOBALAMIN) 100 MCG tablet Take 100 mcg by mouth daily.     No current facility-administered medications for this visit.    PHYSICAL EXAM Vitals:   12/10/21 0817  BP: 115/76  Pulse: 71  Resp: 20  Temp: 97.9 F (36.6 C)  SpO2: 97%  Weight: 144 lb (65.3 kg)   Height: 5' 4.5" (1.638 m)    Constitutional: Well appearing woman in no acute distress Cardiac: Regular rate and rhythm.  Respiratory: unlabored. Peripheral vascular: 2+ dorsalis pedis pulses.  No reticular veins.  No varicosities.  No edema about the ankles.  No Stemmer sign.  PERTINENT LABORATORY AND RADIOLOGIC DATA  Most recent CBC    Latest Ref Rng & Units 11/16/2018   12:35 AM 11/15/2018    8:08 AM 11/14/2018   10:44 PM  CBC  WBC 4.0 - 10.5 K/uL 7.9  8.6  12.7   Hemoglobin 12.0 - 15.0 g/dL 13.6  13.0  13.4   Hematocrit 36.0 - 46.0 % 41.2  39.7  40.5   Platelets 150 - 400 K/uL 276  266  276      Most recent CMP    Latest Ref Rng & Units 07/22/2021    7:54 AM 03/13/2021   12:00 AM 03/12/2021   12:00 AM  CMP  Glucose 70 - 99 mg/dL 175     BUN 6 - 24 mg/dL 17   19      Creatinine 0.57 - 1.00 mg/dL 0.72   0.6      Sodium 134 - 144 mmol/L 143     Potassium 3.5 - 5.2 mmol/L 4.5     Chloride 96 - 106 mmol/L 108     CO2 20 - 29 mmol/L 23     Calcium 8.7 - 10.2 mg/dL 9.8  10.4       Total Protein 6.0 - 8.5 g/dL 6.3     Total Bilirubin 0.0 - 1.2 mg/dL 0.3     Alkaline Phos 44 - 121 IU/L 103     AST 0 - 40 IU/L 22     ALT 0 - 32 IU/L 21        This result is from an external source.    Renal function CrCl cannot be calculated (Patient's most recent lab result is older than the maximum 21 days allowed.).  Hemoglobin A1C  Date Value  11/25/2021 8.0 % (A)  03/13/2021 8.1   HbA1c POC (<> result, manual entry) (%)  Date Value  08/06/2021 10.4  LDL Chol Calc (NIH)  Date Value Ref Range Status  01/17/2019 33 0 - 99 mg/dL Final   LDL Cholesterol  Date Value Ref Range Status  08/29/2020 35  Final    Left venous reflux study:  - No evidence of deep vein thrombosis seen in the left lower extremity,  from the common femoral through the popliteal veins.  - No evidence of superficial venous thrombosis in the left lower  extremity.   Yevonne Aline. Stanford Breed, MD [facility] Phone Number: [phone] 12/10/2021 8:44 AM  Total time spent on preparing this encounter including chart review, data review, collecting history, examining the patient, coordinating care for this new patient, 45 minutes.  Portions of this report may have been transcribed using voice recognition software.  Every effort has been made to ensure accuracy; however, inadvertent computerized transcription errors may still be present.

## 2021-12-10 ENCOUNTER — Encounter: Payer: Self-pay | Admitting: Vascular Surgery

## 2021-12-10 ENCOUNTER — Ambulatory Visit: Payer: 59 | Admitting: Vascular Surgery

## 2021-12-10 VITALS — BP 115/76 | HR 71 | Temp 97.9°F | Resp 20 | Ht 64.5 in | Wt 144.0 lb

## 2021-12-10 DIAGNOSIS — R600 Localized edema: Secondary | ICD-10-CM | POA: Diagnosis not present

## 2021-12-16 ENCOUNTER — Telehealth: Payer: Self-pay | Admitting: Nurse Practitioner

## 2021-12-16 ENCOUNTER — Encounter: Payer: Self-pay | Admitting: Nurse Practitioner

## 2021-12-16 MED ORDER — EMPAGLIFLOZIN 25 MG PO TABS
25.0000 mg | ORAL_TABLET | Freq: Every day | ORAL | 1 refills | Status: DC
Start: 2021-12-16 — End: 2022-06-04

## 2021-12-16 NOTE — Telephone Encounter (Signed)
New message      1. Which medications need to be refilled? (please list name of each medication and dose if known) empagliflozin (JARDIANCE) 25 MG TABS table  2. Which pharmacy/location (including street and city if local pharmacy) is medication to be sent to? [facility] [place]   3. Do they need a 30 day or 90 day supply? 90 day supply

## 2021-12-17 ENCOUNTER — Other Ambulatory Visit: Payer: Self-pay | Admitting: *Deleted

## 2021-12-17 NOTE — Telephone Encounter (Signed)
Patient had left a telephone message stating that she would need a 90 supply of Jardiance.This was sent to her phamracy.

## 2021-12-17 NOTE — Telephone Encounter (Signed)
A refill has been sent for a 90 supply to the patient's pharmacy.

## 2022-01-27 ENCOUNTER — Other Ambulatory Visit: Payer: Self-pay | Admitting: Nurse Practitioner

## 2022-02-14 ENCOUNTER — Other Ambulatory Visit: Payer: Self-pay

## 2022-02-14 DIAGNOSIS — M810 Age-related osteoporosis without current pathological fracture: Secondary | ICD-10-CM | POA: Insufficient documentation

## 2022-02-17 ENCOUNTER — Other Ambulatory Visit: Payer: Self-pay | Admitting: Nurse Practitioner

## 2022-02-18 ENCOUNTER — Telehealth: Payer: Self-pay | Admitting: Pharmacy Technician

## 2022-02-18 ENCOUNTER — Ambulatory Visit: Payer: 59 | Admitting: Podiatry

## 2022-02-18 ENCOUNTER — Ambulatory Visit (INDEPENDENT_AMBULATORY_CARE_PROVIDER_SITE_OTHER): Payer: 59

## 2022-02-18 ENCOUNTER — Encounter: Payer: Self-pay | Admitting: Podiatry

## 2022-02-18 DIAGNOSIS — E7800 Pure hypercholesterolemia, unspecified: Secondary | ICD-10-CM | POA: Insufficient documentation

## 2022-02-18 DIAGNOSIS — S9031XA Contusion of right foot, initial encounter: Secondary | ICD-10-CM | POA: Diagnosis not present

## 2022-02-18 DIAGNOSIS — L720 Epidermal cyst: Secondary | ICD-10-CM | POA: Insufficient documentation

## 2022-02-18 DIAGNOSIS — M7751 Other enthesopathy of right foot: Secondary | ICD-10-CM

## 2022-02-18 MED ORDER — DEXAMETHASONE SODIUM PHOSPHATE 120 MG/30ML IJ SOLN
2.0000 mg | Freq: Once | INTRAMUSCULAR | Status: AC
  Administered 2022-02-18: 2 mg via INTRA_ARTICULAR

## 2022-02-18 NOTE — Telephone Encounter (Addendum)
Auth Submission: APPROVED Payer: [employer] Medication & CPT/J Code(s) submitted: Prolia (Denosumab) [id number] Route of submission (phone, fax, portal): PORTAL Phone # Fax # Auth type: Buy/Bill Units/visits requested: X2 Reference number: [id number] Approval from: 02/14/22 to 02/15/23   Prolia co-pay card: APPROVED ID: [id number] BIN: [id number] PCN: [id number] GR: [id number] EXP: 01/28/26

## 2022-02-23 NOTE — Progress Notes (Signed)
Subjective:  Patient ID: Casey Mason, female    DOB: 03-21-1966,  MRN: [id number] HPI Chief Complaint  Patient presents with   Diabetes    Foot Exam - last A1c was 8.0  Noticed lump back of heel right while seeing her PCP, wanted to get checked, not sore   New Patient (Initial Visit)    56 y.o. female presents with the above complaint.   ROS: Denies fever chills nausea vomit muscle aches pains calf pain back pain chest pain shortness of breath.  Past Medical History:  Diagnosis Date   Diabetes mellitus without complication ([place])    Diverticulosis of colon    Family history of adverse reaction to anesthesia    mother-- ponv   History of colon polyps    2016- hyperplastic   History of diverticulitis of colon    Nephrolithiasis    right   PONV (postoperative nausea and vomiting)    Prolapse of female bladder, acquired    Right ureteral stone    Past Surgical History:  Procedure Laterality Date   BLADDER SUSPENSION     CESAREAN SECTION  1988   COLONOSCOPY  11-10-2014  in [place] 11/14/2018   Procedure: CORONARY STENT INTERVENTION;  Surgeon: Belva Crome, MD;  Location: [facility];  Service: Cardiovascular;  Laterality: N/A;   CORONARY/GRAFT ACUTE MI REVASCULARIZATION N/A 11/14/2018   Procedure: Coronary/Graft Acute MI Revascularization;  Surgeon: Belva Crome, MD;  Location: [facility];  Service: Cardiovascular;  Laterality: N/A;   CYSTOSCOPY WITH RETROGRADE PYELOGRAM, URETEROSCOPY AND STENT PLACEMENT Right 02/07/2016   Procedure: CYSTOSCOPY WITH RIGHT PYLEOGRAM  RIGHT URETEROSCOPY, , DIALATION OF RIGHT URETER STRICUTURE FLEXIBLE RIGHT URETEROSCOPY, RIGHT PYELOSCOPY,  AND RIGHT DOUBLE STENT PLACEMENT WITH TETHER;  Surgeon: Carolan Clines, MD;  Location: [facility];  Service: Urology;  Laterality: Right;   EXTRACORPOREAL SHOCK WAVE LITHOTRIPSY Right 09/20/2015   LEFT HEART CATH AND CORONARY ANGIOGRAPHY N/A  11/14/2018   Procedure: LEFT HEART CATH AND CORONARY ANGIOGRAPHY;  Surgeon: Belva Crome, MD;  Location: [facility];  Service: Cardiovascular;  Laterality: N/A;   ROTATOR CUFF REPAIR Right 2013 or 2014??   TONSILLECTOMY  2003   TUBAL LIGATION  1992   VAGINAL HYSTERECTOMY  2005    Current Outpatient Medications:    mupirocin ointment (BACTROBAN) 2 %, SMARTSIG:1 Application Topical 2-3 Times Daily, Disp: , Rfl:    atorvastatin (LIPITOR) 40 MG tablet, TAKE 1 TABLET BY MOUTH EVERY DAY, Disp: 90 tablet, Rfl: 3   Cholecalciferol (VITAMIN D3) 2000 units TABS, Take 2 tablets by mouth daily. , Disp: , Rfl:    clopidogrel (PLAVIX) 75 MG tablet, Take 1 tablet (75 mg total) by mouth daily., Disp: 90 tablet, Rfl: 3   empagliflozin (JARDIANCE) 25 MG TABS tablet, Take 1 tablet (25 mg total) by mouth daily., Disp: 90 tablet, Rfl: 1   ibandronate (BONIVA) 150 MG tablet, Take 150 mg by mouth every 30 (thirty) days., Disp: , Rfl:    insulin glargine (LANTUS SOLOSTAR) 100 UNIT/ML Solostar Pen, Inject 12 Units into the skin at bedtime., Disp: 15 mL, Rfl: 3   insulin lispro (HUMALOG KWIKPEN) 100 UNIT/ML KwikPen, Inject 2-5 Units into the skin 3 (three) times daily., Disp: 6 mL, Rfl: 3   Insulin Pen Needle (PEN NEEDLES) 32G X 5 MM MISC, Use to inject insulin 4 times daily, Disp: 100 each, Rfl: 11   Lancets (ONETOUCH DELICA PLUS [id number]) MISC,  Use to monitor glucose 4 times daily., Disp: 100 each, Rfl: 6   metFORMIN (GLUCOPHAGE-XR) 500 MG 24 hr tablet, TAKE 2 TABLETS BY MOUTH TWICE A DAY WITH MEALS, Disp: 180 tablet, Rfl: 0   Multiple Vitamin (MULTIVITAMIN) tablet, Take 1 tablet by mouth daily., Disp: , Rfl:    nitroGLYCERIN (NITROSTAT) 0.4 MG SL tablet, PLACE 1 TABLET (0.4 MG TOTAL) UNDER THE TONGUE EVERY 5 (FIVE) MINUTES X 3 DOSES AS NEEDED FOR CHEST PAIN., Disp: 25 tablet, Rfl: 2   ONETOUCH ULTRA test strip, USE AS INSTRUCTED TO MONITOR GLUCOSE 4 TIMES DAILY., Disp: 100 strip, Rfl: 6   vitamin B-12  (CYANOCOBALAMIN) 100 MCG tablet, Take 100 mcg by mouth daily., Disp: , Rfl:   Allergies  Allergen Reactions   Oxycodone Itching   Sulfa Antibiotics Itching   Sulfur     Other reaction(s): Unknown   Review of Systems Objective:  There were no vitals filed for this visit.  General: Well developed, nourished, in no acute distress, alert and oriented x3   Dermatological: Skin is warm, dry and supple bilateral. Nails x 10 are well maintained; remaining integument appears unremarkable at this time. There are no open sores, no preulcerative lesions, no rash or signs of infection present.  Vascular: Dorsalis Pedis artery and Posterior Tibial artery pedal pulses are 2/4 bilateral with immedate capillary fill time. Pedal hair growth present. No varicosities and no lower extremity edema present bilateral.   Neruologic: Grossly intact via light touch bilateral. Vibratory intact via tuning fork bilateral. Protective threshold with Semmes Wienstein monofilament intact to all pedal sites bilateral. Patellar and Achilles deep tendon reflexes 2+ bilateral. No Babinski or clonus noted bilateral.   Musculoskeletal: No gross boney pedal deformities bilateral. No pain, crepitus, or limitation noted with foot and ankle range of motion bilateral. Muscular strength 5/5 in all groups tested bilateral.  Palpable soft tissue nodule measuring just about a centimeter in diameter appears to be freely movable over the posterior Achilles tendon just superior to its insertion site on the calcaneus.  It is nontender on palpation but is firm.  Gait: Unassisted, Nonantalgic.    Radiographs:  Radiographs taken today demonstrate osseously mature individual only thing of significance in the area of question is a small soft tissue protuberance just superior to the superior margin of the calcaneus.  It appears to be on the superficial side of the Achilles tendon.  There is no calcification to the area.  There is no Haglund's  deformity.  No spurring noted.  Assessment & Plan:   Assessment: Most likely a retrocalcaneal bursitis/retro-Achilles bursitis  Plan: Discussed appropriate shoe gear today.  I injected dexamethasone directly into the lesion today we will follow-up with this in a couple of weeks to see if it has decreased in size if not we will schedule for surgical intervention that she will have to get her A1c below and 8.     Zyasia Halbleib T. [place], [place]

## 2022-02-25 ENCOUNTER — Ambulatory Visit: Payer: 59 | Admitting: Nurse Practitioner

## 2022-03-05 ENCOUNTER — Other Ambulatory Visit: Payer: Self-pay | Admitting: Nurse Practitioner

## 2022-03-06 ENCOUNTER — Ambulatory Visit (INDEPENDENT_AMBULATORY_CARE_PROVIDER_SITE_OTHER): Payer: 59

## 2022-03-06 ENCOUNTER — Encounter: Payer: Self-pay | Admitting: Nurse Practitioner

## 2022-03-06 ENCOUNTER — Ambulatory Visit: Payer: 59 | Admitting: Nurse Practitioner

## 2022-03-06 VITALS — BP 113/72 | HR 71 | Temp 97.8°F | Resp 16 | Ht 65.0 in | Wt 145.0 lb

## 2022-03-06 VITALS — BP 103/67 | HR 80 | Ht 64.5 in | Wt 146.4 lb

## 2022-03-06 DIAGNOSIS — E782 Mixed hyperlipidemia: Secondary | ICD-10-CM

## 2022-03-06 DIAGNOSIS — E139 Other specified diabetes mellitus without complications: Secondary | ICD-10-CM | POA: Diagnosis not present

## 2022-03-06 DIAGNOSIS — M810 Age-related osteoporosis without current pathological fracture: Secondary | ICD-10-CM | POA: Diagnosis not present

## 2022-03-06 MED ORDER — LANTUS SOLOSTAR 100 UNIT/ML ~~LOC~~ SOPN: 10.0000 [IU] | PEN_INJECTOR | Freq: Every day | SUBCUTANEOUS | 3 refills | Status: DC

## 2022-03-06 MED ORDER — DENOSUMAB 60 MG/ML ~~LOC~~ SOSY
60.0000 mg | PREFILLED_SYRINGE | Freq: Once | SUBCUTANEOUS | Status: AC
  Administered 2022-03-06: 60 mg via SUBCUTANEOUS
  Filled 2022-03-06: qty 1

## 2022-03-06 NOTE — Progress Notes (Signed)
Endocrinology Follow Up Note       03/06/2022, 4:40 PM    Subjective:    Patient ID: Casey Mason, female    DOB: [birth year].  Casey Mason is being seen in follow up after being seen in consultation for management of currently uncontrolled symptomatic diabetes requested by  [name], MD.   Past Medical History:  Diagnosis Date   Diabetes mellitus without complication [facility])    Diverticulosis of colon    Family history of adverse reaction to anesthesia    mother-- ponv   History of colon polyps    2016- hyperplastic   History of diverticulitis of colon    Nephrolithiasis    right   PONV (postoperative nausea and vomiting)    Prolapse of female bladder, acquired    Right ureteral stone     Past Surgical History:  Procedure Laterality Date   [place]   COLONOSCOPY  11-10-2014  in [place] 11/14/2018   Procedure: CORONARY STENT INTERVENTION;  Surgeon: Belva Crome, MD;  Location: [place] [facility];  Service: Cardiovascular;  Laterality: N/A;   CORONARY/GRAFT ACUTE MI REVASCULARIZATION N/A 11/14/2018   Procedure: Coronary/Graft Acute MI Revascularization;  Surgeon: Belva Crome, MD;  Location: [place] [facility];  Service: Cardiovascular;  Laterality: N/A;   CYSTOSCOPY WITH RETROGRADE PYELOGRAM, URETEROSCOPY AND STENT PLACEMENT Right 02/07/2016   Procedure: CYSTOSCOPY WITH RIGHT PYLEOGRAM  RIGHT URETEROSCOPY, , DIALATION OF RIGHT URETER STRICUTURE FLEXIBLE RIGHT URETEROSCOPY, RIGHT PYELOSCOPY,  AND RIGHT DOUBLE STENT PLACEMENT WITH TETHER;  Surgeon: Carolan Clines, MD;  Location: [place];  Service: Urology;  Laterality: Right;   EXTRACORPOREAL SHOCK WAVE LITHOTRIPSY Right 09/20/2015   LEFT HEART CATH AND CORONARY ANGIOGRAPHY N/A 11/14/2018   Procedure: LEFT HEART CATH AND CORONARY ANGIOGRAPHY;  Surgeon: Belva Crome, MD;  Location: [place] [facility];  Service: Cardiovascular;  Laterality: N/A;   ROTATOR CUFF REPAIR Right 2013 or 2014??   TONSILLECTOMY  2003   TUBAL LIGATION  1992   VAGINAL HYSTERECTOMY  2005    Social History   Socioeconomic History   Marital status: Married    Spouse name: Denyse Amass   Number of children: 2   Years of education: 14   Highest education level: Associate degree: occupational, Hotel manager, or vocational program  Occupational History   Not on file  Tobacco Use   Smoking status: Former    Packs/day: 0.50    Years: 15.00    Total pack years: 7.50    Types: Cigarettes    Quit date: 11/14/2018    Years since quitting: 3.3   Smokeless tobacco: Never  Vaping Use   Vaping Use: Never used  Substance and Sexual Activity   Alcohol use: Yes    Alcohol/week: 0.0 standard drinks of alcohol    Comment: occasional   Drug use: No   Sexual activity: Not on file  Other Topics Concern   Not on file  Social History Narrative   Not on file   Social Determinants of Health   Financial Resource Strain: Low Risk  (12/21/2018)  Overall Financial Resource Strain (CARDIA)    Difficulty of Paying Living Expenses: Not hard at all  Food Insecurity: No Food Insecurity (12/21/2018)   Hunger Vital Sign    Worried About Running Out of Food in the Last Year: Never true    Ran Out of Food in the Last Year: Never true  Transportation Needs: No Transportation Needs (12/21/2018)   PRAPARE - Hydrologist (Medical): No    Lack of Transportation (Non-Medical): No  Physical Activity: Sufficiently Active (12/21/2018)   Exercise Vital Sign    Days of Exercise per Week: 6 days    Minutes of Exercise per Session: 30 min  Stress: Stress Concern Present (12/21/2018)   [place]    Feeling of Stress : To some extent  Social Connections: Moderately Integrated (12/21/2018)   Social Connection and Isolation  Panel [NHANES]    Frequency of Communication with Friends and Family: More than three times a week    Frequency of Social Gatherings with Friends and Family: Never    Attends Religious Services: 1 to 4 times per year    Active Member of [employer] or Organizations: No    Attends Music therapist: Never    Marital Status: Married    Family History  Problem Relation Age of Onset   Anemia Mother    Emphysema Mother    Osteoporosis Mother    Stomach cancer Neg Hx    Esophageal cancer Neg Hx     Outpatient Encounter Medications as of 03/06/2022  Medication Sig   atorvastatin (LIPITOR) 40 MG tablet TAKE 1 TABLET BY MOUTH EVERY DAY   Cholecalciferol (VITAMIN D3) 2000 units TABS Take 2 tablets by mouth daily.    clopidogrel (PLAVIX) 75 MG tablet Take 1 tablet (75 mg total) by mouth daily.   empagliflozin (JARDIANCE) 25 MG TABS tablet Take 1 tablet (25 mg total) by mouth daily.   ibandronate (BONIVA) 150 MG tablet Take 150 mg by mouth every 30 (thirty) days.   insulin lispro (HUMALOG KWIKPEN) 100 UNIT/ML KwikPen Inject 2-5 Units into the skin 3 (three) times daily.   Insulin Pen Needle (PEN NEEDLES) 32G X 5 MM MISC Use to inject insulin 4 times daily   Lancets (ONETOUCH DELICA PLUS [id number]) MISC Use to monitor glucose 4 times daily.   metFORMIN (GLUCOPHAGE-XR) 500 MG 24 hr tablet Take 1 tablet (500 mg total) by mouth 2 (two) times daily with a meal.   Multiple Vitamin (MULTIVITAMIN) tablet Take 1 tablet by mouth daily.   mupirocin ointment (BACTROBAN) 2 % SMARTSIG:1 Application Topical 2-3 Times Daily   nitroGLYCERIN (NITROSTAT) 0.4 MG SL tablet PLACE 1 TABLET (0.4 MG TOTAL) UNDER THE TONGUE EVERY 5 (FIVE) MINUTES X 3 DOSES AS NEEDED FOR CHEST PAIN.   ONETOUCH ULTRA test strip USE AS INSTRUCTED TO MONITOR GLUCOSE 4 TIMES DAILY.   vitamin B-12 (CYANOCOBALAMIN) 100 MCG tablet Take 100 mcg by mouth daily.   [DISCONTINUED] insulin glargine (LANTUS SOLOSTAR) 100 UNIT/ML Solostar Pen  Inject 12 Units into the skin at bedtime.   insulin glargine (LANTUS SOLOSTAR) 100 UNIT/ML Solostar Pen Inject 10 Units into the skin at bedtime.   [DISCONTINUED] metFORMIN (GLUCOPHAGE-XR) 500 MG 24 hr tablet TAKE 2 TABLETS BY MOUTH TWICE A DAY WITH MEALS   No facility-administered encounter medications on file as of 03/06/2022.    ALLERGIES: Allergies  Allergen Reactions   Oxycodone Itching   Sulfa Antibiotics Itching  Sulfur     Other reaction(s): Unknown    VACCINATION STATUS: Immunization History  Administered Date(s) Administered   Hepatitis A, Adult 03/17/2018   PFIZER(Purple Top)SARS-COV-2 Vaccination 12/29/2019   Typhoid Live 03/17/2018    Diabetes She presents for her follow-up diabetic visit. She has type 1 (recently reclassified as LADA) diabetes mellitus. Onset time: diagnosed at approx age of 60. Her disease course has been stable. There are no hypoglycemic associated symptoms. (Ankle swelling) There are no hypoglycemic complications. Symptoms are stable. Diabetic complications include heart disease. Risk factors for coronary artery disease include diabetes mellitus, dyslipidemia, family history, hypertension and tobacco exposure. Current diabetic treatment includes intensive insulin program and oral agent (dual therapy). She is compliant with treatment most of the time. Her weight is increasing steadily. She is following a generally healthy diet. Meal planning includes avoidance of concentrated sweets. She has had a previous visit with a dietitian (has seen dietician in the past). She participates in exercise intermittently. Her home blood glucose trend is fluctuating minimally. Her overall blood glucose range is 140-180 mg/dl. (She presents today with her meter, no logs, showing slightly above target glycemic profile overall.  Her most recent A1c was 8.1% on 10/4 essentially unchanged from previous visit.  Analysis of her meter shows 7-day average of 158, 14-day average of  165, 30-day average of 158.  She notes she sometimes wakes in the middle of the night in a sweat and notices her glucose that morning is higher than when she went to bed even though she didn't eat anything.) An ACE inhibitor/angiotensin II receptor blocker is not being taken. She does not see a podiatrist.Eye exam is current.  Hyperlipidemia This is a chronic problem. The current episode started more than 1 year ago. The problem is controlled. Recent lipid tests were reviewed and are normal. Exacerbating diseases include diabetes. There are no known factors aggravating her hyperlipidemia. Current antihyperlipidemic treatment includes statins. The current treatment provides moderate improvement of lipids. There are no compliance problems.  Risk factors for coronary artery disease include diabetes mellitus, dyslipidemia and family history.    Review of systems  Constitutional: + increasing body weight (regaining some weight previously lost unintentionally),  current Body mass index is 24.74 kg/m. , no fatigue, no subjective hyperthermia, no subjective hypothermia Eyes: no blurry vision, no xerophthalmia ENT: no sore throat, no nodules palpated in throat, no dysphagia/odynophagia, no hoarseness Cardiovascular: no chest pain, no shortness of breath, no palpitations, no leg swelling Respiratory: no cough, no shortness of breath Gastrointestinal: no nausea/vomiting/diarrhea Musculoskeletal: no muscle/joint aches Skin: no rashes, no hyperemia Neurological: no tremors, no numbness, no tingling, no dizziness Psychiatric: no depression, no anxiety  Objective:     BP 103/67 (BP Location: Right Arm, Patient Position: Sitting, Cuff Size: Large)   Pulse 80   Ht 5' 4.5" (1.638 m)   Wt 146 lb 6.4 oz (66.4 kg)   BMI 24.74 kg/m   Wt Readings from Last 3 Encounters:  03/06/22 146 lb 6.4 oz (66.4 kg)  03/06/22 145 lb (65.8 kg)  12/10/21 144 lb (65.3 kg)     BP Readings from Last 3 Encounters:   03/06/22 103/67  03/06/22 113/72  12/10/21 115/76      Physical Exam- Limited  Constitutional:  Body mass index is 24.74 kg/m. , not in acute distress, normal state of mind Eyes:  EOMI, no exophthalmos Musculoskeletal: no gross deformities, strength intact in all four extremities, no gross restriction of joint movements Skin:  no rashes,  no hyperemia Neurological: no tremor with outstretched hands   Diabetic Foot Exam - Simple   No data filed     CMP ( most recent) CMP     Component Value Date/Time   NA 143 07/22/2021 0754   K 4.5 07/22/2021 0754   CL 108 (H) 07/22/2021 0754   CO2 23 07/22/2021 0754   GLUCOSE 175 (H) 07/22/2021 0754   GLUCOSE 185 (H) 11/16/2018 0035   BUN 17 07/22/2021 0754   CREATININE 0.72 07/22/2021 0754   CALCIUM 9.8 07/22/2021 0754   PROT 6.3 07/22/2021 0754   ALBUMIN 4.4 07/22/2021 0754   AST 22 07/22/2021 0754   ALT 21 07/22/2021 0754   ALKPHOS 103 07/22/2021 0754   BILITOT 0.3 07/22/2021 0754   GFRNONAA 105 03/13/2021 0000   GFRAA >60 11/16/2018 0035     Diabetic Labs (most recent): Lab Results  Component Value Date   HGBA1C 8.0 (A) 11/25/2021   HGBA1C 10.4 08/06/2021   HGBA1C 8.1 03/13/2021   MICROALBUR 10 05/01/2021     Lipid Panel ( most recent) Lipid Panel     Component Value Date/Time   CHOL 79 (L) 01/17/2019 0736   TRIG 86 08/29/2020 0000   HDL 33 (L) 01/17/2019 0736   CHOLHDL 2.4 01/17/2019 0736   CHOLHDL 3.4 11/15/2018 0808   VLDL 18 11/15/2018 0808   LDLCALC 35 08/29/2020 0000   LDLCALC 33 01/17/2019 0736   LABVLDL 13 01/17/2019 0736      No results found for: "TSH", "FREET4"       Latest Reference Range & Units 08/06/21 14:51 08/06/21 15:42  Hemoglobin A1C 4.0 - 5.6 % 10.4   ZNT8 Antibodies U/mL  <15  Anti GAD 65 Antibodies U/mL  >120 (H)  IA-2 Autoantibodies U/mL  <7.5  Type 1 Diabetes Interpretation   Comment  (H): Data is abnormally high    Assessment & Plan:   1) Latent Autoimmune Diabetes in  Adults  She presents today with her meter, no logs, showing slightly above target glycemic profile overall.  Her most recent A1c was 8.1% on 10/4 essentially unchanged from previous visit.  Analysis of her meter shows 7-day average of 158, 14-day average of 165, 30-day average of 158.  She notes she sometimes wakes in the middle of the night in a sweat and notices her glucose that morning is higher than when she went to bed even though she didn't eat anything.   - Casey Mason has currently uncontrolled symptomatic LADA (newly discovered) since 56 years of age.  -Recent labs reviewed.  - I had a long discussion with her about the progressive nature of diabetes and the pathology behind its complications. -her diabetes is complicated by CAD with MI and she remains at a high risk for more acute and chronic complications which include CAD, CVA, CKD, retinopathy, and neuropathy. These are all discussed in detail with her.  The following Lifestyle Medicine recommendations according to [facility]) were discussed and offered to patient and she agrees to start the journey:  A. Whole Foods, Plant-based plate comprising of fruits and vegetables, plant-based proteins, whole-grain carbohydrates was discussed in detail with the patient.   A list for source of those nutrients were also provided to the patient.  Patient will use only water or unsweetened tea for hydration. B.  The need to stay away from risky substances including alcohol, smoking; obtaining 7 to 9 hours of restorative sleep, at least 150 minutes of moderate  intensity exercise weekly, the importance of healthy social connections,  and stress reduction techniques were discussed. C.  A full color page of  Calorie density of various food groups per pound showing examples of each food groups was provided to the patient.  - Nutritional counseling repeated at each appointment due to patients tendency to fall back in to  old habits.  - The patient admits there is a room for improvement in their diet and drink choices. -  Suggestion is made for the patient to avoid simple carbohydrates from their diet including Cakes, Sweet Desserts / Pastries, Ice Cream, Soda (diet and regular), Sweet Tea, Candies, Chips, Cookies, Sweet Pastries, Store Bought Juices, Alcohol in Excess of 1-2 drinks a day, Artificial Sweeteners, Coffee Creamer, and "Sugar-free" Products. This will help patient to have stable blood glucose profile and potentially avoid unintended weight gain.   - I encouraged the patient to switch to unprocessed or minimally processed complex starch and increased protein intake (animal or plant source), fruits, and vegetables.   - Patient is advised to stick to a routine mealtimes to eat 3 meals a day and avoid unnecessary snacks (to snack only to correct hypoglycemia).  - she will be scheduled with Jearld Fenton, RDN, CDE for diabetes education.  - I have approached her with the following individualized plan to manage her diabetes and patient agrees:   -She is advised to lower her Lantus slightly to 10 units SQ nightly (to avoid dumping overnight) and continue her Humalog 2-5 units TID with meals if glucose is above 90 and she is eating (Specific instructions on how to titrate insulin dosage based on glucose readings given to patient in writing).  She can continue her Jardiance 25 mg po daily and Metformin 500 mg po twice daily for now, given her cardiology recommendation (may help with her ankle swelling but not likely to help much with glucose management).    -She is encouraged to continue monitoring blood glucose 4 times daily, before meals and before bed, and to call the clinic if she has readings less than 70 or above 200 for 3 tests in a row.  She had CGM in the past but did not like it due to the alarms and is hesitant to give it another try.  I did give her some information on different insulin pumps to look  into if she is interested.  - she is not an ideal candidate for incretin therapy due to body habitus or LADA diagnosis.  - Specific targets for  A1c; LDL, HDL, and Triglycerides were discussed with the patient.  2) Blood Pressure /Hypertension:  her blood pressure is controlled to target without the use of antihypertensive medications.    3) Lipids/Hyperlipidemia:    Review of her recent lipid panel from 12/31/21 showed controlled LDL at 33 .  she is advised to continue Lipitor 40 mg daily at bedtime.  Side effects and precautions discussed with her.  4)  Weight/Diet:  her Body mass index is 24.74 kg/m.  -    she is NOT a candidate for weight loss. Exercise, and detailed carbohydrates information provided  -  detailed on discharge instructions.  5) Hypercalcemia: She is known to have several instances of elevated blood calcium levels.  She also has history of osteoporosis.  She is on calcium supplement and eats calcium rich foods routinely.  She may benefit from further work up to rule out parathyroid involvement.  We discussed this today.  I added some additional  blood work to assess this further.  Her blood work shows normal parathyroid function and calcium levels.  No further work up needed.  6) Chronic Care/Health Maintenance: -she is not on ACEI/ARB and is on Statin medications and is encouraged to initiate and continue to follow up with Ophthalmology, Dentist, Podiatrist at least yearly or according to recommendations, and advised to stay away from smoking (quit a very long time ago). I have recommended yearly flu vaccine and pneumonia vaccine at least every 5 years; moderate intensity exercise for up to 150 minutes weekly; and sleep for at least 7 hours a day.  - she is advised to maintain close follow up with [name], MD for primary care needs, as well as her other providers for optimal and coordinated care.      I spent 40 minutes in the care of the patient today including  review of labs from [facility], Lipids, Thyroid Function, Hematology (current and previous including abstractions from other facilities); face-to-face time discussing  her blood glucose readings/logs, discussing hypoglycemia and hyperglycemia episodes and symptoms, medications doses, her options of short and long term treatment based on the latest standards of care / guidelines;  discussion about incorporating lifestyle medicine;  and documenting the encounter. Risk reduction counseling performed per USPSTF guidelines to reduce obesity and cardiovascular risk factors.     Please refer to Patient Instructions for Blood Glucose Monitoring and Insulin/Medications Dosing Guide"  in media tab for additional information. Please  also refer to " Patient Self Inventory" in the Media  tab for reviewed elements of pertinent patient history.  Calton Dach participated in the discussions, expressed understanding, and voiced agreement with the above plans.  All questions were answered to her satisfaction. she is encouraged to contact clinic should she have any questions or concerns prior to her return visit.     Follow up plan: - Return in about 3 months (around 06/05/2022) for Diabetes F/U with A1c in office, No previsit labs, Bring meter and logs.   Rayetta Pigg, [facility] [address] [place], [place] [address] Phone: [phone] Fax: [phone]  03/06/2022, 4:40 PM

## 2022-03-06 NOTE — Progress Notes (Signed)
Diagnosis: Osteoporosis  Provider:  Marshell Garfinkel MD  Procedure: Injection  Prolia (Denosumab), Dose: 60 mg, Site: subcutaneous, Number of injections: 1  Post Care: Observation period completed  Discharge: Condition: Good, Destination: Home . AVS provided to patient.   Performed by:  Koren Shiver, RN

## 2022-04-21 ENCOUNTER — Other Ambulatory Visit: Payer: Self-pay | Admitting: Interventional Cardiology

## 2022-04-21 ENCOUNTER — Other Ambulatory Visit: Payer: Self-pay | Admitting: Nurse Practitioner

## 2022-04-23 ENCOUNTER — Other Ambulatory Visit: Payer: Self-pay

## 2022-04-23 ENCOUNTER — Other Ambulatory Visit: Payer: Self-pay | Admitting: *Deleted

## 2022-04-23 MED ORDER — NITROGLYCERIN 0.4 MG SL SUBL: 0.4000 mg | SUBLINGUAL_TABLET | SUBLINGUAL | 3 refills | Status: DC | PRN

## 2022-05-25 ENCOUNTER — Other Ambulatory Visit: Payer: Self-pay | Admitting: Nurse Practitioner

## 2022-05-25 ENCOUNTER — Encounter: Payer: Self-pay | Admitting: Nurse Practitioner

## 2022-05-26 MED ORDER — METFORMIN HCL ER 500 MG PO TB24: 500.0000 mg | ORAL_TABLET | Freq: Two times a day (BID) | ORAL | 0 refills | Status: DC

## 2022-05-27 ENCOUNTER — Other Ambulatory Visit: Payer: Self-pay | Admitting: Nurse Practitioner

## 2022-06-04 ENCOUNTER — Other Ambulatory Visit: Payer: Self-pay | Admitting: Nurse Practitioner

## 2022-06-05 ENCOUNTER — Encounter: Payer: Self-pay | Admitting: Nurse Practitioner

## 2022-06-05 ENCOUNTER — Ambulatory Visit: Payer: 59 | Admitting: Nurse Practitioner

## 2022-06-05 VITALS — BP 125/77 | HR 90 | Ht 64.5 in | Wt 144.0 lb

## 2022-06-05 DIAGNOSIS — E782 Mixed hyperlipidemia: Secondary | ICD-10-CM

## 2022-06-05 DIAGNOSIS — E139 Other specified diabetes mellitus without complications: Secondary | ICD-10-CM | POA: Diagnosis not present

## 2022-06-05 LAB — POCT UA - MICROALBUMIN
Creatinine, POC: 50 mg/dL
Microalbumin Ur, POC: 10 mg/L

## 2022-06-05 LAB — POCT GLYCOSYLATED HEMOGLOBIN (HGB A1C): Hemoglobin A1C: 7.6 % — AB (ref 4.0–5.6)

## 2022-06-05 MED ORDER — LANTUS SOLOSTAR 100 UNIT/ML ~~LOC~~ SOPN: 12.0000 [IU] | PEN_INJECTOR | Freq: Every day | SUBCUTANEOUS | 3 refills | Status: DC

## 2022-06-05 MED ORDER — INSULIN LISPRO (1 UNIT DIAL) 100 UNIT/ML (KWIKPEN): 2.0000 [IU] | PEN_INJECTOR | Freq: Three times a day (TID) | SUBCUTANEOUS | 3 refills | Status: DC

## 2022-06-05 NOTE — Progress Notes (Addendum)
Endocrinology Follow Up Note       06/05/2022, 4:05 PM    Subjective:    Patient ID: Casey Mason, female    DOB: 18-Jun-1965.  Casey Mason is being seen in follow up after being seen in consultation for management of currently uncontrolled symptomatic diabetes requested by  [name], MD.   Past Medical History:  Diagnosis Date   Diabetes mellitus without complication [facility])    Diverticulosis of colon    Family history of adverse reaction to anesthesia    mother-- ponv   History of colon polyps    2016- hyperplastic   History of diverticulitis of colon    Nephrolithiasis    right   PONV (postoperative nausea and vomiting)    Prolapse of female bladder, acquired    Right ureteral stone     Past Surgical History:  Procedure Laterality Date   [place]   COLONOSCOPY  11-10-2014  in [place] 11/14/2018   Procedure: CORONARY STENT INTERVENTION;  Surgeon: Belva Crome, MD;  Location: [place] [facility];  Service: Cardiovascular;  Laterality: N/A;   CORONARY/GRAFT ACUTE MI REVASCULARIZATION N/A 11/14/2018   Procedure: Coronary/Graft Acute MI Revascularization;  Surgeon: Belva Crome, MD;  Location: [place] [facility];  Service: Cardiovascular;  Laterality: N/A;   CYSTOSCOPY WITH RETROGRADE PYELOGRAM, URETEROSCOPY AND STENT PLACEMENT Right 02/07/2016   Procedure: CYSTOSCOPY WITH RIGHT PYLEOGRAM  RIGHT URETEROSCOPY, , DIALATION OF RIGHT URETER STRICUTURE FLEXIBLE RIGHT URETEROSCOPY, RIGHT PYELOSCOPY,  AND RIGHT DOUBLE STENT PLACEMENT WITH TETHER;  Surgeon: Carolan Clines, MD;  Location: [place];  Service: Urology;  Laterality: Right;   EXTRACORPOREAL SHOCK WAVE LITHOTRIPSY Right 09/20/2015   LEFT HEART CATH AND CORONARY ANGIOGRAPHY N/A 11/14/2018   Procedure: LEFT HEART CATH AND CORONARY ANGIOGRAPHY;  Surgeon: Belva Crome, MD;  Location: [place];  Service: Cardiovascular;  Laterality: N/A;   ROTATOR CUFF REPAIR Right 2013 or 2014??   TONSILLECTOMY  2003   TUBAL LIGATION  1992   VAGINAL HYSTERECTOMY  2005    Social History   Socioeconomic History   Marital status: Married    Spouse name: Denyse Amass   Number of children: 2   Years of education: 14   Highest education level: Associate degree: occupational, Hotel manager, or vocational program  Occupational History   Not on file  Tobacco Use   Smoking status: Former    Packs/day: 0.50    Years: 15.00    Total pack years: 7.50    Types: Cigarettes    Quit date: 11/14/2018    Years since quitting: 3.5   Smokeless tobacco: Never  Vaping Use   Vaping Use: Never used  Substance and Sexual Activity   Alcohol use: Yes    Alcohol/week: 0.0 standard drinks of alcohol    Comment: occasional   Drug use: No   Sexual activity: Not on file  Other Topics Concern   Not on file  Social History Narrative   Not on file   Social Determinants of Health   Financial Resource Strain: Low Risk  (12/21/2018)  Overall Financial Resource Strain (CARDIA)    Difficulty of Paying Living Expenses: Not hard at all  Food Insecurity: No Food Insecurity (12/21/2018)   Hunger Vital Sign    Worried About Running Out of Food in the Last Year: Never true    Ran Out of Food in the Last Year: Never true  Transportation Needs: No Transportation Needs (12/21/2018)   PRAPARE - Hydrologist (Medical): No    Lack of Transportation (Non-Medical): No  Physical Activity: Sufficiently Active (12/21/2018)   Exercise Vital Sign    Days of Exercise per Week: 6 days    Minutes of Exercise per Session: 30 min  Stress: Stress Concern Present (12/21/2018)   [place]    Feeling of Stress : To some extent  Social Connections: Moderately Integrated (12/21/2018)   Social Connection and Isolation  Panel [NHANES]    Frequency of Communication with Friends and Family: More than three times a week    Frequency of Social Gatherings with Friends and Family: Never    Attends Religious Services: 1 to 4 times per year    Active Member of [employer] or Organizations: No    Attends Music therapist: Never    Marital Status: Married    Family History  Problem Relation Age of Onset   Anemia Mother    Emphysema Mother    Osteoporosis Mother    Stomach cancer Neg Hx    Esophageal cancer Neg Hx     Outpatient Encounter Medications as of 06/05/2022  Medication Sig   atorvastatin (LIPITOR) 40 MG tablet TAKE 1 TABLET BY MOUTH EVERY DAY   Cholecalciferol (VITAMIN D3) 2000 units TABS Take 2 tablets by mouth daily.    clopidogrel (PLAVIX) 75 MG tablet Take 1 tablet (75 mg total) by mouth daily.   empagliflozin (JARDIANCE) 25 MG TABS tablet TAKE 1 TABLET (25 MG TOTAL) BY MOUTH DAILY.   ibandronate (BONIVA) 150 MG tablet Take 150 mg by mouth every 30 (thirty) days.   insulin glargine (LANTUS SOLOSTAR) 100 UNIT/ML Solostar Pen Inject 12 Units into the skin at bedtime.   insulin lispro (HUMALOG KWIKPEN) 100 UNIT/ML KwikPen Inject 2-5 Units into the skin 3 (three) times daily.   Insulin Pen Needle (PEN NEEDLES) 32G X 5 MM MISC Use to inject insulin 4 times daily   Lancets (ONETOUCH DELICA PLUS Q000111Q) MISC USE TO MONITOR GLUCOSE 4 TIMES DAILY.   metFORMIN (GLUCOPHAGE-XR) 500 MG 24 hr tablet Take 1 tablet (500 mg total) by mouth 2 (two) times daily with a meal.   Multiple Vitamin (MULTIVITAMIN) tablet Take 1 tablet by mouth daily.   mupirocin ointment (BACTROBAN) 2 % SMARTSIG:1 Application Topical 2-3 Times Daily   nitroGLYCERIN (NITROSTAT) 0.4 MG SL tablet Place 1 tablet (0.4 mg total) under the tongue every 5 (five) minutes x 3 doses as needed for chest pain.   ONETOUCH ULTRA test strip USE AS INSTRUCTED TO MONITOR GLUCOSE 4 TIMES DAILY.   vitamin B-12 (CYANOCOBALAMIN) 100 MCG tablet Take  100 mcg by mouth daily.   [DISCONTINUED] insulin glargine (LANTUS SOLOSTAR) 100 UNIT/ML Solostar Pen Inject 10 Units into the skin at bedtime.   [DISCONTINUED] insulin lispro (HUMALOG KWIKPEN) 100 UNIT/ML KwikPen Inject 2-5 Units into the skin 3 (three) times daily.   No facility-administered encounter medications on file as of 06/05/2022.    ALLERGIES: Allergies  Allergen Reactions   Oxycodone Itching   Sulfa Antibiotics Itching  Sulfur     Other reaction(s): Unknown    VACCINATION STATUS: Immunization History  Administered Date(s) Administered   Hepatitis A, Adult 03/17/2018   PFIZER(Purple Top)SARS-COV-2 Vaccination 12/29/2019   Typhoid Live 03/17/2018    Diabetes She presents for her follow-up diabetic visit. She has type 1 (recently reclassified as LADA) diabetes mellitus. Onset time: diagnosed at approx age of 32. Her disease course has been improving. There are no hypoglycemic associated symptoms. (Ankle swelling) There are no hypoglycemic complications. Symptoms are stable. Diabetic complications include heart disease. Risk factors for coronary artery disease include diabetes mellitus, dyslipidemia, family history, hypertension and tobacco exposure. Current diabetic treatment includes intensive insulin program and oral agent (dual therapy). She is compliant with treatment most of the time. Her weight is fluctuating minimally. She is following a generally healthy diet. Meal planning includes avoidance of concentrated sweets. She has had a previous visit with a dietitian (has seen dietician in the past). She participates in exercise intermittently. Her home blood glucose trend is decreasing steadily. Her overall blood glucose range is 140-180 mg/dl. (She presents today with her meter showing improving glycemic profile overall.  Her POCT A1c today is 7.6%, improving from last visit of 8.1%.  She reports she has her ups and downs with this diagnosis but has started to learn more about  what triggers highs.  Analysis of her meter shows 7-day average of 151, 14-day average of 155, 30-day average of 157.  She denies any significant hypoglycemia.) An ACE inhibitor/angiotensin II receptor blocker is not being taken. She does not see a podiatrist.Eye exam is current.  Hyperlipidemia This is a chronic problem. The current episode started more than 1 year ago. The problem is controlled. Recent lipid tests were reviewed and are normal. Exacerbating diseases include diabetes. There are no known factors aggravating her hyperlipidemia. Current antihyperlipidemic treatment includes statins. The current treatment provides moderate improvement of lipids. There are no compliance problems.  Risk factors for coronary artery disease include diabetes mellitus, dyslipidemia and family history.    Review of systems  Constitutional: + minimally fluctuating body weight,  current Body mass index is 24.34 kg/m. , no fatigue, no subjective hyperthermia, no subjective hypothermia Eyes: no blurry vision, no xerophthalmia ENT: no sore throat, no nodules palpated in throat, no dysphagia/odynophagia, no hoarseness Cardiovascular: no chest pain, no shortness of breath, no palpitations, no leg swelling Respiratory: no cough, no shortness of breath Gastrointestinal: no nausea/vomiting/diarrhea Musculoskeletal: no muscle/joint aches Skin: no rashes, no hyperemia Neurological: no tremors, no numbness, no tingling, no dizziness Psychiatric: no depression, no anxiety  Objective:     BP 125/77   Pulse 90   Ht 5' 4.5" (1.638 m)   Wt 144 lb (65.3 kg)   BMI 24.34 kg/m   Wt Readings from Last 3 Encounters:  06/05/22 144 lb (65.3 kg)  03/06/22 146 lb 6.4 oz (66.4 kg)  03/06/22 145 lb (65.8 kg)     BP Readings from Last 3 Encounters:  06/05/22 125/77  03/06/22 103/67  03/06/22 113/72     Physical Exam- Limited  Constitutional:  Body mass index is 24.34 kg/m. , not in acute distress, normal state of  mind Eyes:  EOMI, no exophthalmos Musculoskeletal: no gross deformities, strength intact in all four extremities, no gross restriction of joint movements Skin:  no rashes, no hyperemia Neurological: no tremor with outstretched hands   Diabetic Foot Exam - Simple   Simple Foot Form Diabetic Foot exam was performed with the following findings: Yes  06/05/2022  4:05 PM  Visual Inspection No deformities, no ulcerations, no other skin breakdown bilaterally: Yes Sensation Testing Intact to touch and monofilament testing bilaterally: Yes Pulse Check Posterior Tibialis and Dorsalis pulse intact bilaterally: Yes Comments     CMP ( most recent) CMP     Component Value Date/Time   NA 143 07/22/2021 0754   K 4.5 07/22/2021 0754   CL 108 (H) 07/22/2021 0754   CO2 23 07/22/2021 0754   GLUCOSE 175 (H) 07/22/2021 0754   GLUCOSE 185 (H) 11/16/2018 0035   BUN 17 07/22/2021 0754   CREATININE 0.72 07/22/2021 0754   CALCIUM 9.8 07/22/2021 0754   PROT 6.3 07/22/2021 0754   ALBUMIN 4.4 07/22/2021 0754   AST 22 07/22/2021 0754   ALT 21 07/22/2021 0754   ALKPHOS 103 07/22/2021 0754   BILITOT 0.3 07/22/2021 0754   GFRNONAA 105 03/13/2021 0000   GFRAA >60 11/16/2018 0035     Diabetic Labs (most recent): Lab Results  Component Value Date   HGBA1C 7.6 (A) 06/05/2022   HGBA1C 8.0 (A) 11/25/2021   HGBA1C 10.4 08/06/2021   MICROALBUR 10 06/05/2022   MICROALBUR 10 05/01/2021     Lipid Panel ( most recent) Lipid Panel     Component Value Date/Time   CHOL 79 (L) 01/17/2019 0736   TRIG 86 08/29/2020 0000   HDL 33 (L) 01/17/2019 0736   CHOLHDL 2.4 01/17/2019 0736   CHOLHDL 3.4 11/15/2018 0808   VLDL 18 11/15/2018 0808   LDLCALC 35 08/29/2020 0000   LDLCALC 33 01/17/2019 0736   LABVLDL 13 01/17/2019 0736      No results found for: "TSH", "FREET4"       Latest Reference Range & Units 08/06/21 14:51 08/06/21 15:42  Hemoglobin A1C 4.0 - 5.6 % 10.4   ZNT8 Antibodies U/mL  <15  Anti GAD  65 Antibodies U/mL  >120 (H)  IA-2 Autoantibodies U/mL  <7.5  Type 1 Diabetes Interpretation   Comment  (H): Data is abnormally high    Assessment & Plan:   1) Latent Autoimmune Diabetes in Adults  She presents today with her meter showing improving glycemic profile overall.  Her POCT A1c today is 7.6%, improving from last visit of 8.1%.  She reports she has her ups and downs with this diagnosis but has started to learn more about what triggers highs.  Analysis of her meter shows 7-day average of 151, 14-day average of 155, 30-day average of 157.  She denies any significant hypoglycemia.  Casey Mason has currently uncontrolled symptomatic LADA (newly discovered) since 57 years of age.  -Recent labs reviewed.  - I had a long discussion with her about the progressive nature of diabetes and the pathology behind its complications. -her diabetes is complicated by CAD with MI and she remains at a high risk for more acute and chronic complications which include CAD, CVA, CKD, retinopathy, and neuropathy. These are all discussed in detail with her.  The following Lifestyle Medicine recommendations according to [facility]) were discussed and offered to patient and she agrees to start the journey:  A. Whole Foods, Plant-based plate comprising of fruits and vegetables, plant-based proteins, whole-grain carbohydrates was discussed in detail with the patient.   A list for source of those nutrients were also provided to the patient.  Patient will use only water or unsweetened tea for hydration. B.  The need to stay away from risky substances including alcohol, smoking; obtaining 7 to  9 hours of restorative sleep, at least 150 minutes of moderate intensity exercise weekly, the importance of healthy social connections,  and stress reduction techniques were discussed. C.  A full color page of  Calorie density of various food groups per pound showing examples of each food  groups was provided to the patient.  - Nutritional counseling repeated at each appointment due to patients tendency to fall back in to old habits.  - The patient admits there is a room for improvement in their diet and drink choices. -  Suggestion is made for the patient to avoid simple carbohydrates from their diet including Cakes, Sweet Desserts / Pastries, Ice Cream, Soda (diet and regular), Sweet Tea, Candies, Chips, Cookies, Sweet Pastries, Store Bought Juices, Alcohol in Excess of 1-2 drinks a day, Artificial Sweeteners, Coffee Creamer, and "Sugar-free" Products. This will help patient to have stable blood glucose profile and potentially avoid unintended weight gain.   - I encouraged the patient to switch to unprocessed or minimally processed complex starch and increased protein intake (animal or plant source), fruits, and vegetables.   - Patient is advised to stick to a routine mealtimes to eat 3 meals a day and avoid unnecessary snacks (to snack only to correct hypoglycemia).  - she will be scheduled with Jearld Fenton, RDN, CDE for diabetes education.  - I have approached her with the following individualized plan to manage her diabetes and patient agrees:   -She is advised to increase her Lantus slightly to 12 units SQ nightly and continue her Humalog 2-5 units TID with meals if glucose is above 90 and she is eating (Specific instructions on how to titrate insulin dosage based on glucose readings given to patient in writing).  She can continue her Jardiance 25 mg po daily and Metformin 500 mg po twice daily for now, given her cardiology recommendation (may help with her ankle swelling but not likely to help much with glucose management).    -She is encouraged to continue monitoring blood glucose 4 times daily, before meals and before bed, and to call the clinic if she has readings less than 70 or above 200 for 3 tests in a row.  She had CGM in the past but did not like it due to the  alarms and is hesitant to give it another try.  I did give her some information on different insulin pumps to look into if she is interested.  - she is not an ideal candidate for incretin therapy due to body habitus or LADA diagnosis.  - Specific targets for  A1c; LDL, HDL, and Triglycerides were discussed with the patient.  2) Blood Pressure /Hypertension:  her blood pressure is controlled to target without the use of antihypertensive medications.    3) Lipids/Hyperlipidemia:    Review of her recent lipid panel from 12/31/21 showed controlled LDL at 33 .  she is advised to continue Lipitor 40 mg daily at bedtime.  Side effects and precautions discussed with her.  4)  Weight/Diet:  her Body mass index is 24.34 kg/m.  -    she is NOT a candidate for weight loss. Exercise, and detailed carbohydrates information provided  -  detailed on discharge instructions.  5) Hypercalcemia: She is known to have several instances of elevated blood calcium levels.  She also has history of osteoporosis.  She is on calcium supplement and eats calcium rich foods routinely.  She may benefit from further work up to rule out parathyroid involvement.  We discussed  this today.  I added some additional blood work to assess this further.  Her blood work shows normal parathyroid function and calcium levels.  No further work up needed.  6) Chronic Care/Health Maintenance: -she is not on ACEI/ARB and is on Statin medications and is encouraged to initiate and continue to follow up with Ophthalmology, Dentist, Podiatrist at least yearly or according to recommendations, and advised to stay away from smoking (quit a very long time ago). I have recommended yearly flu vaccine and pneumonia vaccine at least every 5 years; moderate intensity exercise for up to 150 minutes weekly; and sleep for at least 7 hours a day.  - she is advised to maintain close follow up with [name], MD for primary care needs, as well as her other  providers for optimal and coordinated care.      I spent  25  minutes in the care of the patient today including review of labs from [place], Lipids, Thyroid Function, Hematology (current and previous including abstractions from other facilities); face-to-face time discussing  her blood glucose readings/logs, discussing hypoglycemia and hyperglycemia episodes and symptoms, medications doses, her options of short and long term treatment based on the latest standards of care / guidelines;  discussion about incorporating lifestyle medicine;  and documenting the encounter. Risk reduction counseling performed per USPSTF guidelines to reduce obesity and cardiovascular risk factors.     Please refer to Patient Instructions for Blood Glucose Monitoring and Insulin/Medications Dosing Guide"  in media tab for additional information. Please  also refer to " Patient Self Inventory" in the Media  tab for reviewed elements of pertinent patient history.  Casey Mason participated in the discussions, expressed understanding, and voiced agreement with the above plans.  All questions were answered to her satisfaction. she is encouraged to contact clinic should she have any questions or concerns prior to her return visit.     Follow up plan: - Return in about 4 months (around 10/05/2022) for Diabetes F/U with A1c in office, No previsit labs, Bring meter and logs.   Rayetta Pigg, [facility] [address] [place],  [address] Phone: [phone] Fax: [phone]  06/05/2022, 4:05 PM

## 2022-06-09 ENCOUNTER — Other Ambulatory Visit: Payer: Self-pay | Admitting: Nurse Practitioner

## 2022-08-01 ENCOUNTER — Other Ambulatory Visit: Payer: Self-pay

## 2022-08-01 MED ORDER — CLOPIDOGREL BISULFATE 75 MG PO TABS: 75.0000 mg | ORAL_TABLET | Freq: Every day | ORAL | 0 refills | Status: DC

## 2022-08-10 ENCOUNTER — Other Ambulatory Visit: Payer: Self-pay | Admitting: Nurse Practitioner

## 2022-09-05 ENCOUNTER — Ambulatory Visit (INDEPENDENT_AMBULATORY_CARE_PROVIDER_SITE_OTHER): Payer: 59

## 2022-09-05 VITALS — BP 112/70 | HR 74 | Temp 98.5°F | Resp 18 | Ht 65.0 in | Wt 147.4 lb

## 2022-09-05 DIAGNOSIS — M810 Age-related osteoporosis without current pathological fracture: Secondary | ICD-10-CM

## 2022-09-05 MED ORDER — DENOSUMAB 60 MG/ML ~~LOC~~ SOSY
60.0000 mg | PREFILLED_SYRINGE | Freq: Once | SUBCUTANEOUS | Status: AC
  Administered 2022-09-05: 60 mg via SUBCUTANEOUS
  Filled 2022-09-05: qty 1

## 2022-09-05 NOTE — Progress Notes (Signed)
Diagnosis: Osteoporosis  Provider:  Chilton Greathouse MD  Procedure: Injection  Prolia (Denosumab), Dose: 60 mg, Site: subcutaneous, Number of injections: 1  Post Care:     Discharge: Condition: Good, Destination: Home . AVS Declined  Performed by:  Garnette Czech, RN

## 2022-09-06 ENCOUNTER — Other Ambulatory Visit: Payer: Self-pay | Admitting: Nurse Practitioner

## 2022-09-24 NOTE — Progress Notes (Unsigned)
Cardiology Office Note:   Date:  09/24/2022  ID:  ANNE BOLTZ, DOB 05-28-1965, MRN [id number]  History of Present Illness:   Casey Mason is a 57 y.o. female with history of anterior STEMI in 10/2018 s/p LAD PCI, DMII, tobacco use, HTN and HLD who was previously followed by Dr. Katrinka Blazing who now returns to clinic for follow-up.  Was last seen in clinic on 07/26/21. Was doing well at that time. Last TTE 11/2020 with EF 60-65%, normal RV, mild MR.   Today, ***  Past Medical History:  Diagnosis Date   Diabetes mellitus without complication (HCC)    Diverticulosis of colon    Family history of adverse reaction to anesthesia    mother-- ponv   History of colon polyps    2016- hyperplastic   History of diverticulitis of colon    Nephrolithiasis    right   PONV (postoperative nausea and vomiting)    Prolapse of female bladder, acquired    Right ureteral stone      ROS: ***  Studies Reviewed:    EKG:  ***  Cardiac Studies & Procedures   CARDIAC CATHETERIZATION  CARDIAC CATHETERIZATION 11/15/2018  Narrative  Stuttering anterior septal myocardial infarction over the past 12 hours  99% proximal LAD with TIMI grade II flow treated with PCI using 2 seven 5 x 22 Onyx drug-eluting stent and postdilated to 3.0 mm in diameter with resultant TIMI grade III flow.  0% stenosis.  Normal left main.  First diagonal which arises proximal to the LAD culprit site contains diffuse 60% ostial to proximal stenosis.  Normal circumflex  Normal RCA, dominant.  RECOMMENDATIONS:   Dual antiplatelet therapy with aspirin and Brilinta x12 months.  Aggressive secondary risk factor modification per guideline recommendations.  Depending upon clinical course, would anticipate discharge in 24 to 36 hours.  Needs phase 2 cardiac rehab to help reinforce lifestyle changes necessary to improve outcome.  Findings Coronary Findings Diagnostic  Dominance: Right  Left Anterior Descending Mid LAD lesion  is 99% stenosed.  First Diagonal Branch 1st Diag lesion is 60% stenosed.  Intervention  Mid LAD lesion Stent A stent was successfully placed. Post-Intervention Lesion Assessment The intervention was successful. There is a 5% residual stenosis post intervention.     ECHOCARDIOGRAM  ECHOCARDIOGRAM COMPLETE 12/24/2020  Narrative ECHOCARDIOGRAM REPORT    Patient Name:   Casey Mason Date of Exam: 12/24/2020 Medical Rec #:  [id number]     Height:       65.0 in Accession #:    [id number]    Weight:       129.0 lb Date of Birth:  Jul 22, 1965      BSA:          1.642 m Patient Age:    55 years      BP:           118/72 mmHg Patient Gender: F             HR:           70 bpm. Exam Location:  [address]  Procedure: 2D Echo, Cardiac Doppler and Color Doppler  Indications:    R00.2  History:        Patient has prior history of Echocardiogram examinations, most recent 11/15/2018. CAD, Palpitations; Risk Factors:Current Smoker, Hypertension, Diabetes and Dyslipidemia.  Sonographer:    Samule Ohm RDCS Referring Phys: [phone] Barry Dienes [facility]  IMPRESSIONS   1. Left ventricular ejection fraction, by estimation, is 60  to 65%. The left ventricle has normal function. The left ventricle has no regional wall motion abnormalities. Left ventricular diastolic parameters were normal. 2. Right ventricular systolic function is normal. The right ventricular size is normal. There is normal pulmonary artery systolic pressure. The estimated right ventricular systolic pressure is 26.2 mmHg. 3. The mitral valve is normal in structure. Mild mitral valve regurgitation. No evidence of mitral stenosis. 4. The aortic valve is normal in structure. Aortic valve regurgitation is not visualized. No aortic stenosis is present. 5. The inferior vena cava is normal in size with greater than 50% respiratory variability, suggesting right atrial pressure of 3 mmHg.  FINDINGS Left Ventricle: Left ventricular ejection  fraction, by estimation, is 60 to 65%. The left ventricle has normal function. The left ventricle has no regional wall motion abnormalities. The left ventricular internal cavity size was normal in size. There is no left ventricular hypertrophy. Left ventricular diastolic parameters were normal. Normal left ventricular filling pressure.  Right Ventricle: The right ventricular size is normal. No increase in right ventricular wall thickness. Right ventricular systolic function is normal. There is normal pulmonary artery systolic pressure. The tricuspid regurgitant velocity is 2.41 m/s, and with an assumed right atrial pressure of 3 mmHg, the estimated right ventricular systolic pressure is 26.2 mmHg.  Left Atrium: Left atrial size was normal in size.  Right Atrium: Right atrial size was normal in size.  Pericardium: There is no evidence of pericardial effusion.  Mitral Valve: The mitral valve is normal in structure. Mild mitral valve regurgitation. No evidence of mitral valve stenosis.  Tricuspid Valve: The tricuspid valve is normal in structure. Tricuspid valve regurgitation is mild . No evidence of tricuspid stenosis.  Aortic Valve: The aortic valve is normal in structure. Aortic valve regurgitation is not visualized. No aortic stenosis is present.  Pulmonic Valve: The pulmonic valve was normal in structure. Pulmonic valve regurgitation is trivial. No evidence of pulmonic stenosis.  Aorta: The aortic root is normal in size and structure.  Venous: The inferior vena cava is normal in size with greater than 50% respiratory variability, suggesting right atrial pressure of 3 mmHg.  IAS/Shunts: No atrial level shunt detected by color flow Doppler.   LEFT VENTRICLE PLAX 2D LVIDd:         4.90 cm  Diastology LVIDs:         3.00 cm  LV e' medial:    12.20 cm/s LV PW:         0.80 cm  LV E/e' medial:  7.9 LV IVS:        0.80 cm  LV e' lateral:   13.40 cm/s LVOT diam:     2.20 cm  LV E/e'  lateral: 7.2 LV SV:         65 LV SV Index:   40 LVOT Area:     3.80 cm   RIGHT VENTRICLE             IVC RV S prime:     24.40 cm/s  IVC diam: 1.60 cm TAPSE (M-mode): 2.3 cm RVSP:           26.2 mmHg  LEFT ATRIUM             Index       RIGHT ATRIUM           Index LA diam:        3.10 cm 1.89 cm/m  RA Pressure: 3.00 mmHg LA Vol (A2C):   42.0  ml 25.58 ml/m RA Area:     11.10 cm LA Vol (A4C):   40.4 ml 24.61 ml/m RA Volume:   23.80 ml  14.50 ml/m LA Biplane Vol: 43.4 ml 26.43 ml/m AORTIC VALVE LVOT Vmax:   85.40 cm/s LVOT Vmean:  58.400 cm/s LVOT VTI:    0.171 m  AORTA Ao Root diam: 3.30 cm Ao Asc diam:  3.00 cm  MITRAL VALVE               TRICUSPID VALVE MV Area (PHT): 3.08 cm    TR Peak grad:   23.2 mmHg MV Decel Time: 246 msec    TR Vmax:        241.00 cm/s MV E velocity: 96.40 cm/s  Estimated RAP:  3.00 mmHg MV A velocity: 79.70 cm/s  RVSP:           26.2 mmHg MV E/A ratio:  1.21 SHUNTS Systemic VTI:  0.17 m Systemic Diam: 2.20 cm  Armanda Magic MD Electronically signed by Armanda Magic MD Signature Date/Time: 12/24/2020/8:14:40 AM    Final    MONITORS  LONG TERM MONITOR (3-14 DAYS) 12/29/2020  Narrative  Normal sinus rhythm  Rare PAC's and PVC's  No sustained arrhythmia.  No correlation of arrhythmia and symptoms   Normal study.    Patch Wear Time:  13 days and 21 hours (2022-09-08T22:11:59-0400 to 2022-09-22T19:23:18-0400)  Patient had a min HR of 55 bpm, max HR of 121 bpm, and avg HR of 83 bpm. Predominant underlying rhythm was Sinus Rhythm. Isolated SVEs were rare (<1.0%), SVE Couplets were rare (<1.0%), and SVE Triplets were rare (<1.0%). Isolated VEs were rare (<1.0%), VE Couplets were rare (<1.0%), and no VE Triplets were present.            Risk Assessment/Calculations:   {Does this patient have ATRIAL FIBRILLATION?:[id number]} No BP recorded.  {Refresh Note OR Click here to enter BP  :1}***        Physical Exam:   VS:  There  were no vitals taken for this visit.   Wt Readings from Last 3 Encounters:  09/05/22 147 lb 6.4 oz (66.9 kg)  06/05/22 144 lb (65.3 kg)  03/06/22 146 lb 6.4 oz (66.4 kg)     GEN: Well nourished, well developed in no acute distress NECK: No JVD; No carotid bruits CARDIAC: ***RRR, no murmurs, rubs, gallops RESPIRATORY:  Clear to auscultation without rales, wheezing or rhonchi  ABDOMEN: Soft, non-tender, non-distended EXTREMITIES:  No edema; No deformity   ASSESSMENT AND PLAN:   #CAD with history of LAD PCI: -Patient with history of anterior STEMI in 2020 s/p PCI to prox-LAD -TTE 2022 with EF 60-65% -Currently doing well without significant anginal symptoms -Continue lipitor 40mg  daily -Continue plavix 75mg  daily -Continue nitroglycerin prn  #HLD: -Continue lipitor 40mg  daily -Goal LDL<55  #DMII: -Management per PCP  #Tobacco Use: -Encouraged cessation    {Are you ordering a CV Procedure (e.g. stress test, cath, DCCV, TEE, etc)?   Press F2        :[id number]}   Signed, Meriam Sprague, MD

## 2022-09-29 ENCOUNTER — Encounter: Payer: Self-pay | Admitting: Cardiology

## 2022-09-29 ENCOUNTER — Ambulatory Visit: Payer: 59 | Attending: Cardiology | Admitting: Cardiology

## 2022-09-29 VITALS — BP 108/66 | HR 71 | Ht 65.0 in | Wt 146.4 lb

## 2022-09-29 DIAGNOSIS — R600 Localized edema: Secondary | ICD-10-CM | POA: Diagnosis not present

## 2022-09-29 DIAGNOSIS — Z87891 Personal history of nicotine dependence: Secondary | ICD-10-CM | POA: Diagnosis not present

## 2022-09-29 DIAGNOSIS — I25118 Atherosclerotic heart disease of native coronary artery with other forms of angina pectoris: Secondary | ICD-10-CM

## 2022-09-29 DIAGNOSIS — Z720 Tobacco use: Secondary | ICD-10-CM

## 2022-09-29 DIAGNOSIS — I10 Essential (primary) hypertension: Secondary | ICD-10-CM

## 2022-09-29 MED ORDER — FUROSEMIDE 20 MG PO TABS: 20.0000 mg | ORAL_TABLET | ORAL | 3 refills | Status: DC | PRN

## 2022-09-29 NOTE — Patient Instructions (Signed)
Medication Instructions:  Your physician has recommended you make the following change in your medication:   Lasix 20 mg daily as needed for edema  *If you need a refill on your cardiac medications before your next appointment, please call your pharmacy*  Follow-Up: At [facility], you and your health needs are our priority.  As part of our continuing mission to provide you with exceptional heart care, we have created designated Provider Care Teams.  These Care Teams include your primary Cardiologist (physician) and Advanced Practice Providers (APPs -  Physician Assistants and Nurse Practitioners) who all work together to provide you with the care you need, when you need it.   Your next appointment:   1 year(s)  Provider:   Dr. Scharlene Gloss

## 2022-09-30 ENCOUNTER — Encounter: Payer: Self-pay | Admitting: Nurse Practitioner

## 2022-10-09 ENCOUNTER — Encounter: Payer: Self-pay | Admitting: Nurse Practitioner

## 2022-10-09 ENCOUNTER — Ambulatory Visit: Payer: 59 | Admitting: Nurse Practitioner

## 2022-10-09 VITALS — BP 102/66 | HR 87 | Ht 65.0 in | Wt 147.8 lb

## 2022-10-09 DIAGNOSIS — E139 Other specified diabetes mellitus without complications: Secondary | ICD-10-CM | POA: Diagnosis not present

## 2022-10-09 DIAGNOSIS — E782 Mixed hyperlipidemia: Secondary | ICD-10-CM

## 2022-10-09 DIAGNOSIS — Z794 Long term (current) use of insulin: Secondary | ICD-10-CM | POA: Diagnosis not present

## 2022-10-09 LAB — POCT GLYCOSYLATED HEMOGLOBIN (HGB A1C): Hemoglobin A1C: 7.5 % — AB (ref 4.0–5.6)

## 2022-10-09 MED ORDER — ACCU-CHEK GUIDE ME W/DEVICE KIT: PACK | 0 refills | Status: DC

## 2022-10-09 MED ORDER — ACCU-CHEK SOFTCLIX LANCETS MISC: 12 refills | Status: DC

## 2022-10-09 MED ORDER — ACCU-CHEK GUIDE VI STRP: ORAL_STRIP | 12 refills | Status: DC

## 2022-10-09 NOTE — Progress Notes (Signed)
Endocrinology Follow Up Note       10/10/2022, 7:34 AM    Subjective:    Patient ID: Casey Mason, female    DOB: [birth year].  Casey Mason is being seen in follow up after being seen in consultation for management of currently uncontrolled symptomatic diabetes requested by  Selinda Flavin, MD.   Past Medical History:  Diagnosis Date   Diabetes mellitus without complication [facility])    Diverticulosis of colon    Family history of adverse reaction to anesthesia    mother-- ponv   History of colon polyps    2016- hyperplastic   History of diverticulitis of colon    Nephrolithiasis    right   PONV (postoperative nausea and vomiting)    Prolapse of female bladder, acquired    Right ureteral stone     Past Surgical History:  Procedure Laterality Date   BLADDER SUSPENSION     CESAREAN SECTION  1988   COLONOSCOPY  11-10-2014  in [place]   CORONARY STENT INTERVENTION N/A 11/14/2018   Procedure: CORONARY STENT INTERVENTION;  Surgeon: Lyn Records, MD;  Location: [facility];  Service: Cardiovascular;  Laterality: N/A;   CORONARY/GRAFT ACUTE MI REVASCULARIZATION N/A 11/14/2018   Procedure: Coronary/Graft Acute MI Revascularization;  Surgeon: Lyn Records, MD;  Location: [facility];  Service: Cardiovascular;  Laterality: N/A;   CYSTOSCOPY WITH RETROGRADE PYELOGRAM, URETEROSCOPY AND STENT PLACEMENT Right 02/07/2016   Procedure: CYSTOSCOPY WITH RIGHT PYLEOGRAM  RIGHT URETEROSCOPY, , DIALATION OF RIGHT URETER STRICUTURE FLEXIBLE RIGHT URETEROSCOPY, RIGHT PYELOSCOPY,  AND RIGHT DOUBLE STENT PLACEMENT WITH TETHER;  Surgeon: Jethro Bolus, MD;  Location[facility] ;  Service: Urology;  Laterality: Right;   EXTRACORPOREAL SHOCK WAVE LITHOTRIPSY Right 09/20/2015   LEFT HEART CATH AND CORONARY ANGIOGRAPHY N/A 11/14/2018   Procedure: LEFT HEART CATH AND CORONARY ANGIOGRAPHY;  Surgeon: Lyn Records, MD;  Location: [facility];  Service: Cardiovascular;  Laterality: N/A;   ROTATOR CUFF REPAIR Right 2013 or 2014??   TONSILLECTOMY  2003   TUBAL LIGATION  1992   VAGINAL HYSTERECTOMY  2005    Social History   Socioeconomic History   Marital status: Married    Spouse name: Verdon Cummins   Number of children: 2   Years of education: 14   Highest education level: Associate degree: occupational, Scientist, product/process development, or vocational program  Occupational History   Not on file  Tobacco Use   Smoking status: Former    Current packs/day: 0.00    Average packs/day: 0.5 packs/day for 15.0 years (7.5 ttl pk-yrs)    Types: Cigarettes    Start date: 11/14/2003    Quit date: 11/14/2018    Years since quitting: 3.9   Smokeless tobacco: Never  Vaping Use   Vaping status: Never Used  Substance and Sexual Activity   Alcohol use: Yes    Alcohol/week: 0.0 standard drinks of alcohol    Comment: occasional   Drug use: No   Sexual activity: Not on file  Other Topics Concern   Not on file  Social History Narrative   Not on file   Social Determinants of Health   Financial  Resource Strain: Low Risk  (12/21/2018)   Overall Financial Resource Strain (CARDIA)    Difficulty of Paying Living Expenses: Not hard at all  Food Insecurity: No Food Insecurity (12/21/2018)   Hunger Vital Sign    Worried About Running Out of Food in the Last Year: Never true    Ran Out of Food in the Last Year: Never true  Transportation Needs: No Transportation Needs (12/21/2018)   PRAPARE - Administrator, Civil Service (Medical): No    Lack of Transportation (Non-Medical): No  Physical Activity: Sufficiently Active (12/21/2018)   Exercise Vital Sign    Days of Exercise per Week: 6 days    Minutes of Exercise per Session: 30 min  Stress: Stress Concern Present (12/21/2018)   [employer] of Occupational Health - Occupational Stress Questionnaire    Feeling of Stress : To some extent  Social Connections: Unknown  (08/05/2021)   Received from [facility]   Social Network    Social Network: Not on file    Family History  Problem Relation Age of Onset   Anemia Mother    Emphysema Mother    Osteoporosis Mother    Stomach cancer Neg Hx    Esophageal cancer Neg Hx     Outpatient Encounter Medications as of 10/09/2022  Medication Sig   Accu-Chek Softclix Lancets lancets Use as instructed to monitor glucose 4 times daily   atorvastatin (LIPITOR) 40 MG tablet TAKE 1 TABLET BY MOUTH EVERY DAY   Blood Glucose Monitoring Suppl (ACCU-CHEK GUIDE ME) w/Device KIT Use to check glucose 4 times daily   Cholecalciferol (VITAMIN D3) 2000 units TABS Take 2 tablets by mouth daily.    clopidogrel (PLAVIX) 75 MG tablet Take 1 tablet (75 mg total) by mouth daily.   furosemide (LASIX) 20 MG tablet Take 1 tablet (20 mg total) by mouth as needed. Once daily as needed for edema   glucose blood (ACCU-CHEK GUIDE) test strip Use as instructed to check glucose 4 times daily   insulin glargine (LANTUS SOLOSTAR) 100 UNIT/ML Solostar Pen Inject 12 Units into the skin at bedtime.   insulin lispro (HUMALOG KWIKPEN) 100 UNIT/ML KwikPen Inject 2-5 Units into the skin 3 (three) times daily.   Insulin Pen Needle (BD PEN NEEDLE NANO 2ND GEN) 32G X 4 MM MISC USE TO INJECT INSULIN 4 TIMES DAILY   metFORMIN (GLUCOPHAGE-XR) 500 MG 24 hr tablet Take 1 tablet (500 mg total) by mouth 2 (two) times daily with a meal.   Multiple Vitamin (MULTIVITAMIN) tablet Take 1 tablet by mouth daily.   mupirocin ointment (BACTROBAN) 2 % SMARTSIG:1 Application Topical 2-3 Times Daily   nitroGLYCERIN (NITROSTAT) 0.4 MG SL tablet Place 1 tablet (0.4 mg total) under the tongue every 5 (five) minutes x 3 doses as needed for chest pain.   vitamin B-12 (CYANOCOBALAMIN) 100 MCG tablet Take 100 mcg by mouth daily.   [DISCONTINUED] JARDIANCE 25 MG TABS tablet TAKE 1 TABLET (25 MG TOTAL) BY MOUTH DAILY.   [DISCONTINUED] Lancets (ONETOUCH DELICA PLUS LANCET30G) MISC  USE TO MONITOR GLUCOSE 4 TIMES DAILY.   [DISCONTINUED] ONETOUCH ULTRA test strip USE AS INSTRUCTED TO MONITOR GLUCOSE 4 TIMES DAILY.   No facility-administered encounter medications on file as of 10/09/2022.    ALLERGIES: Allergies  Allergen Reactions   Oxycodone Itching   Sulfa Antibiotics Itching   Sulfur     Other reaction(s): Unknown    VACCINATION STATUS: Immunization History  Administered Date(s) Administered   Hepatitis A,  Adult 03/17/2018   PFIZER(Purple Top)SARS-COV-2 Vaccination 12/29/2019   Typhoid Live 03/17/2018    Diabetes She presents for her follow-up diabetic visit. She has type 1 (recently reclassified as LADA) diabetes mellitus. Onset time: diagnosed at approx age of 42. Her disease course has been stable. There are no hypoglycemic associated symptoms. There are no diabetic associated symptoms. There are no hypoglycemic complications. Symptoms are stable. Diabetic complications include heart disease. Risk factors for coronary artery disease include diabetes mellitus, dyslipidemia, family history, hypertension and tobacco exposure. Current diabetic treatment includes intensive insulin program and oral agent (dual therapy). She is compliant with treatment most of the time. Her weight is fluctuating minimally. She is following a generally healthy diet. Meal planning includes avoidance of concentrated sweets. She has had a previous visit with a dietitian (has seen dietician in the past). She participates in exercise intermittently. Her home blood glucose trend is fluctuating minimally. Her overall blood glucose range is 140-180 mg/dl. (She presents today with her meter, no logs, showing stable, slightly above target glycemic profile.  Her POCT A1c today is 7.5%, essentially unchanged from previous visit.  Analysis of her meter shows 7-day average of 175, 14-day average of 167, 30-day average of 172.  She notes she had several UTIs since last visit and was recently diagnosed with  a kidney stone too.) An ACE inhibitor/angiotensin II receptor blocker is not being taken. She does not see a podiatrist.Eye exam is current.  Hyperlipidemia This is a chronic problem. The current episode started more than 1 year ago. The problem is controlled. Recent lipid tests were reviewed and are normal. Exacerbating diseases include diabetes. There are no known factors aggravating her hyperlipidemia. Current antihyperlipidemic treatment includes statins. The current treatment provides moderate improvement of lipids. There are no compliance problems.  Risk factors for coronary artery disease include diabetes mellitus, dyslipidemia and family history.    Review of systems  Constitutional: + minimally fluctuating body weight,  current Body mass index is 24.6 kg/m. , no fatigue, no subjective hyperthermia, no subjective hypothermia Eyes: no blurry vision, no xerophthalmia ENT: no sore throat, no nodules palpated in throat, no dysphagia/odynophagia, no hoarseness Cardiovascular: no chest pain, no shortness of breath, no palpitations, no leg swelling Respiratory: no cough, no shortness of breath Gastrointestinal: no nausea/vomiting/diarrhea Musculoskeletal: no muscle/joint aches Skin: no rashes, no hyperemia Neurological: no tremors, no numbness, no tingling, no dizziness Psychiatric: no depression, no anxiety  Objective:     BP 102/66 (BP Location: Left Arm, Patient Position: Sitting, Cuff Size: Normal)   Pulse 87   Ht 5\' 5"  (1.651 m)   Wt 147 lb 12.8 oz (67 kg)   BMI 24.60 kg/m   Wt Readings from Last 3 Encounters:  10/09/22 147 lb 12.8 oz (67 kg)  09/29/22 146 lb 6.4 oz (66.4 kg)  09/05/22 147 lb 6.4 oz (66.9 kg)     BP Readings from Last 3 Encounters:  10/09/22 102/66  09/29/22 108/66  09/05/22 112/70     Physical Exam- Limited  Constitutional:  Body mass index is 24.6 kg/m. , not in acute distress, normal state of mind Eyes:  EOMI, no exophthalmos Musculoskeletal: no  gross deformities, strength intact in all four extremities, no gross restriction of joint movements Skin:  no rashes, no hyperemia Neurological: no tremor with outstretched hands   Diabetic Foot Exam - Simple   No data filed     CMP ( most recent) CMP     Component Value Date/Time   NA  143 07/22/2021 0754   K 4.5 07/22/2021 0754   CL 108 (H) 07/22/2021 0754   CO2 23 07/22/2021 0754   GLUCOSE 175 (H) 07/22/2021 0754   GLUCOSE 185 (H) 11/16/2018 0035   BUN 17 07/22/2021 0754   CREATININE 0.72 07/22/2021 0754   CALCIUM 9.8 07/22/2021 0754   PROT 6.3 07/22/2021 0754   ALBUMIN 4.4 07/22/2021 0754   AST 22 07/22/2021 0754   ALT 21 07/22/2021 0754   ALKPHOS 103 07/22/2021 0754   BILITOT 0.3 07/22/2021 0754   GFRNONAA 105 03/13/2021 0000   GFRAA >60 11/16/2018 0035     Diabetic Labs (most recent): Lab Results  Component Value Date   HGBA1C 7.5 (A) 10/09/2022   HGBA1C 7.6 (A) 06/05/2022   HGBA1C 8.0 (A) 11/25/2021   MICROALBUR 10 06/05/2022   MICROALBUR 10 05/01/2021     Lipid Panel ( most recent) Lipid Panel     Component Value Date/Time   CHOL 79 (L) 01/17/2019 0736   TRIG 86 08/29/2020 0000   HDL 33 (L) 01/17/2019 0736   CHOLHDL 2.4 01/17/2019 0736   CHOLHDL 3.4 11/15/2018 0808   VLDL 18 11/15/2018 0808   LDLCALC 35 08/29/2020 0000   LDLCALC 33 01/17/2019 0736   LABVLDL 13 01/17/2019 0736      No results found for: "TSH", "FREET4"       Latest Reference Range & Units 08/06/21 14:51 08/06/21 15:42  Hemoglobin A1C 4.0 - 5.6 % 10.4   ZNT8 Antibodies U/mL  <15  Anti GAD 65 Antibodies U/mL  >120 (H)  IA-2 Autoantibodies U/mL  <7.5  Type 1 Diabetes Interpretation   Comment  (H): Data is abnormally high    Assessment & Plan:   1) Latent Autoimmune Diabetes in Adults  She presents today with her meter, no logs, showing stable, slightly above target glycemic profile.  Her POCT A1c today is 7.5%, essentially unchanged from previous visit.  Analysis of her  meter shows 7-day average of 175, 14-day average of 167, 30-day average of 172.  She notes she had several UTIs since last visit and was recently diagnosed with a kidney stone too.  Casey Mason has currently uncontrolled symptomatic LADA (newly discovered) since 57 years of age.  -Recent labs reviewed.  - I had a long discussion with her about the progressive nature of diabetes and the pathology behind its complications. -her diabetes is complicated by CAD with MI and she remains at a high risk for more acute and chronic complications which include CAD, CVA, CKD, retinopathy, and neuropathy. These are all discussed in detail with her.  The following Lifestyle Medicine recommendations according to American College of Lifestyle Medicine [facility]) were discussed and offered to patient and she agrees to start the journey:  A. Whole Foods, Plant-based plate comprising of fruits and vegetables, plant-based proteins, whole-grain carbohydrates was discussed in detail with the patient.   A list for source of those nutrients were also provided to the patient.  Patient will use only water or unsweetened tea for hydration. B.  The need to stay away from risky substances including alcohol, smoking; obtaining 7 to 9 hours of restorative sleep, at least 150 minutes of moderate intensity exercise weekly, the importance of healthy social connections,  and stress reduction techniques were discussed. C.  A full color page of  Calorie density of various food groups per pound showing examples of each food groups was provided to the patient.  - Nutritional counseling repeated at each appointment  due to patients tendency to fall back in to old habits.  - The patient admits there is a room for improvement in their diet and drink choices. -  Suggestion is made for the patient to avoid simple carbohydrates from their diet including Cakes, Sweet Desserts / Pastries, Ice Cream, Soda (diet and regular), Sweet Tea, Candies,  Chips, Cookies, Sweet Pastries, Store Bought Juices, Alcohol in Excess of 1-2 drinks a day, Artificial Sweeteners, Coffee Creamer, and "Sugar-free" Products. This will help patient to have stable blood glucose profile and potentially avoid unintended weight gain.   - I encouraged the patient to switch to unprocessed or minimally processed complex starch and increased protein intake (animal or plant source), fruits, and vegetables.   - Patient is advised to stick to a routine mealtimes to eat 3 meals a day and avoid unnecessary snacks (to snack only to correct hypoglycemia).  - she will be scheduled with Norm Salt, RDN, CDE for diabetes education.  - I have approached her with the following individualized plan to manage her diabetes and patient agrees:   -She is advised to continue her Lantus 12 units SQ nightly and continue her Humalog 2-5 units TID with meals if glucose is above 90 and she is eating (Specific instructions on how to titrate insulin dosage based on glucose readings given to patient in writing).  I did stop her [place] Pepper today given multiple UTIs since last visit.  We also discussed stopping Metformin on subsequent visits as insulin is the only treatment for type 1 diabetes.     -She is encouraged to continue monitoring blood glucose 4 times daily, before meals and before bed, and to call the clinic if she has readings less than 70 or above 200 for 3 tests in a row.  She had CGM in the past but did not like it due to the alarms.   I did give her some information on different insulin pumps to look into if she is interested.  - she is not an ideal candidate for incretin therapy due to body habitus or LADA diagnosis.  - Specific targets for  A1c; LDL, HDL, and Triglycerides were discussed with the patient.  2) Blood Pressure /Hypertension:  her blood pressure is controlled to target without the use of antihypertensive medications.    3) Lipids/Hyperlipidemia:    Review of her  recent lipid panel from 12/31/21 showed controlled LDL at 33 .  she is advised to continue Lipitor 40 mg daily at bedtime.  Side effects and precautions discussed with her.  4)  Weight/Diet:  her Body mass index is 24.6 kg/m.  -    she is NOT a candidate for weight loss. Exercise, and detailed carbohydrates information provided  -  detailed on discharge instructions.  5) Hypercalcemia: She is known to have several instances of elevated blood calcium levels.  She also has history of osteoporosis.  She is on calcium supplement and eats calcium rich foods routinely.  She may benefit from further work up to rule out parathyroid involvement.  We discussed this today.  I added some additional blood work to assess this further.  Her blood work shows normal parathyroid function and calcium levels.  No further work up needed.  6) Chronic Care/Health Maintenance: -she is not on ACEI/ARB and is on Statin medications and is encouraged to initiate and continue to follow up with Ophthalmology, Dentist, Podiatrist at least yearly or according to recommendations, and advised to stay away from smoking (quit a very  long time ago). I have recommended yearly flu vaccine and pneumonia vaccine at least every 5 years; moderate intensity exercise for up to 150 minutes weekly; and sleep for at least 7 hours a day.  - she is advised to maintain close follow up with Selinda Flavin, MD for primary care needs, as well as her other providers for optimal and coordinated care.      I spent  41  minutes in the care of the patient today including review of labs from CMP, Lipids, Thyroid Function, Hematology (current and previous including abstractions from other facilities); face-to-face time discussing  her blood glucose readings/logs, discussing hypoglycemia and hyperglycemia episodes and symptoms, medications doses, her options of short and long term treatment based on the latest standards of care / guidelines;  discussion about  incorporating lifestyle medicine;  and documenting the encounter. Risk reduction counseling performed per USPSTF guidelines to reduce obesity and cardiovascular risk factors.     Please refer to Patient Instructions for Blood Glucose Monitoring and Insulin/Medications Dosing Guide"  in media tab for additional information. Please  also refer to " Patient Self Inventory" in the Media  tab for reviewed elements of pertinent patient history.  Casey Mason participated in the discussions, expressed understanding, and voiced agreement with the above plans.  All questions were answered to her satisfaction. she is encouraged to contact clinic should she have any questions or concerns prior to her return visit.     Follow up plan: - Return in about 4 months (around 02/09/2023) for Diabetes F/U with A1c in office, No previsit labs, Bring meter and logs.   Ronny Bacon, [facility] [address] [place], [place] [address] Phone: [phone] Fax: [phone]  10/10/2022, 7:34 AM

## 2022-10-17 ENCOUNTER — Telehealth: Payer: Self-pay | Admitting: Cardiology

## 2022-10-17 NOTE — Telephone Encounter (Signed)
   Patient Name: Casey Mason  DOB: 1965-06-24 MRN: [id number]  Primary Cardiologist: Shari Prows   Chart reviewed as part of pre-operative protocol coverage. Given past medical history and time since last visit, based on ACC/AHA guidelines, Casey Mason is at acceptable risk for the planned procedure without further cardiovascular testing.   May hold Plavix for 5 days prior to procedure. . Please resume Plavix as soon as possible postprocedure, at the discretion of the surgeon.    I will route this recommendation to the requesting party via Epic fax function and remove from pre-op pool.  Please call with questions.  Joni Reining, NP 10/17/2022, 12:30 PM

## 2022-10-17 NOTE — Telephone Encounter (Signed)
   Pre-operative Risk Assessment    Patient Name: Casey Mason  DOB: 03-29-1966 MRN: [id number]      Request for Surgical Clearance    Procedure:   Ureteroscopy  Date of Surgery:  Clearance TBD                                 Surgeon:  Dr. Alvester Morin Surgeon's Group or Practice Name:  [facility] Phone number:  [phone] Ext. [phone] Fax number:  [phone]   Type of Clearance Requested:   - Medical  - Pharmacy:  Hold Clopidogrel (Plavix) 5 days prior   Type of Anesthesia:  General    Additional requests/questions:  Please fax a copy of medical clearance to the surgeon's office.  [name]   10/17/2022, 9:42 AM

## 2022-10-25 ENCOUNTER — Other Ambulatory Visit: Payer: Self-pay | Admitting: Nurse Practitioner

## 2022-10-28 ENCOUNTER — Other Ambulatory Visit: Payer: Self-pay

## 2022-10-28 MED ORDER — CLOPIDOGREL BISULFATE 75 MG PO TABS: 75.0000 mg | ORAL_TABLET | Freq: Every day | ORAL | 3 refills | Status: DC

## 2022-10-28 MED ORDER — ATORVASTATIN CALCIUM 40 MG PO TABS: 40.0000 mg | ORAL_TABLET | Freq: Every day | ORAL | 3 refills | Status: DC

## 2022-10-29 ENCOUNTER — Other Ambulatory Visit: Payer: Self-pay

## 2022-10-29 MED ORDER — FUROSEMIDE 20 MG PO TABS: 20.0000 mg | ORAL_TABLET | ORAL | 3 refills | Status: DC | PRN

## 2022-11-10 ENCOUNTER — Other Ambulatory Visit: Payer: Self-pay | Admitting: Nurse Practitioner

## 2022-11-26 ENCOUNTER — Encounter: Payer: Self-pay | Admitting: Nurse Practitioner

## 2022-11-26 ENCOUNTER — Other Ambulatory Visit: Payer: Self-pay

## 2022-11-26 DIAGNOSIS — E139 Other specified diabetes mellitus without complications: Secondary | ICD-10-CM

## 2022-11-26 MED ORDER — GLUCOSE BLOOD VI STRP: 1.0000 | ORAL_STRIP | Freq: Four times a day (QID) | 2 refills | Status: DC

## 2022-11-26 MED ORDER — ONETOUCH DELICA LANCETS 30G MISC: 2 refills | Status: AC

## 2022-11-26 MED ORDER — ONETOUCH DELICA LANCETS 30G MISC
2 refills | Status: DC
Start: 2022-11-26 — End: 2022-11-26

## 2022-11-26 MED ORDER — BLOOD GLUCOSE MONITOR KIT
1.0000 | PACK | Freq: Four times a day (QID) | 0 refills | Status: DC
Start: 2022-11-26 — End: 2022-11-26

## 2022-11-26 MED ORDER — BLOOD GLUCOSE MONITOR KIT: 1.0000 | PACK | Freq: Four times a day (QID) | 0 refills | Status: AC

## 2022-11-26 MED ORDER — GLUCOSE BLOOD VI STRP
1.0000 | ORAL_STRIP | Freq: Four times a day (QID) | 2 refills | Status: DC
Start: 2022-11-26 — End: 2023-02-12

## 2022-12-24 ENCOUNTER — Other Ambulatory Visit: Payer: Self-pay | Admitting: Nurse Practitioner

## 2022-12-27 ENCOUNTER — Other Ambulatory Visit: Payer: Self-pay | Admitting: Nurse Practitioner

## 2023-01-20 ENCOUNTER — Other Ambulatory Visit: Payer: Self-pay

## 2023-01-20 ENCOUNTER — Telehealth: Payer: Self-pay

## 2023-01-20 NOTE — Telephone Encounter (Signed)
Auth Submission: NO AUTH NEEDED Site of care: Site of care: [facility] Payer: [employer]HC commercial Medication & CPT/J Code(s) submitted: Prolia (Denosumab) [id number] Route of submission (phone, fax, portal): portal Phone # Fax # Auth type: Buy/Bill PB Units/visits requested: 60mg  x 1 dose Reference number: [id number] Approval from: 01/20/23 to 03/31/23   I confirmed on the [facility] portal that prolia does not need a prior authorization for this patient.

## 2023-01-21 ENCOUNTER — Other Ambulatory Visit: Payer: Self-pay | Admitting: Nurse Practitioner

## 2023-01-22 NOTE — Telephone Encounter (Signed)
I stopped the Jardiance at last visit but I did continue her Metformin in hopes to discontinue it at the next visit.  She is still in that honeymoon phase where the Metformin may help some.

## 2023-01-30 ENCOUNTER — Telehealth: Payer: Self-pay | Admitting: Pharmacy Technician

## 2023-01-30 NOTE — Telephone Encounter (Addendum)
Auth Submission: NO AUTH NEEDED Site of care: Site of care: [facility] Payer: [employer] Medication & CPT/J Code(s) submitted: Prolia (Denosumab) [id number] Route of submission (phone, fax, portal):  Phone # Fax # Auth type: Buy/Bill PB Units/visits requested: 2 Reference number: [id number] Approval from: 01/30/23 to 02/02/24   Prolia co-pay card: approved Patient Id: [id number] Exp: 01/28/26 Cvc: 452 Bin: [id number] Pcn: [id number] Group: [id number]  Card id: [id number]

## 2023-02-05 ENCOUNTER — Other Ambulatory Visit: Payer: Self-pay | Admitting: Nurse Practitioner

## 2023-02-12 ENCOUNTER — Ambulatory Visit: Payer: 59 | Admitting: Nurse Practitioner

## 2023-02-12 ENCOUNTER — Encounter: Payer: Self-pay | Admitting: Nurse Practitioner

## 2023-02-12 VITALS — BP 115/75 | HR 88 | Ht 65.5 in | Wt 154.2 lb

## 2023-02-12 DIAGNOSIS — E782 Mixed hyperlipidemia: Secondary | ICD-10-CM

## 2023-02-12 DIAGNOSIS — Z794 Long term (current) use of insulin: Secondary | ICD-10-CM | POA: Diagnosis not present

## 2023-02-12 DIAGNOSIS — E139 Other specified diabetes mellitus without complications: Secondary | ICD-10-CM | POA: Diagnosis not present

## 2023-02-12 LAB — POCT GLYCOSYLATED HEMOGLOBIN (HGB A1C): Hemoglobin A1C: 8.3 % — AB (ref 4.0–5.6)

## 2023-02-12 MED ORDER — INSULIN LISPRO (1 UNIT DIAL) 100 UNIT/ML (KWIKPEN): 4.0000 [IU] | PEN_INJECTOR | Freq: Three times a day (TID) | SUBCUTANEOUS | 3 refills | Status: DC

## 2023-02-12 MED ORDER — BD PEN NEEDLE NANO 2ND GEN 32G X 4 MM MISC: 1 refills | Status: DC

## 2023-02-12 MED ORDER — ONETOUCH VERIO VI STRP: ORAL_STRIP | 6 refills | Status: DC

## 2023-02-12 MED ORDER — DEXCOM G7 SENSOR MISC
1.0000 | 3 refills | Status: DC
Start: 2023-02-12 — End: 2023-02-19

## 2023-02-12 MED ORDER — METFORMIN HCL ER 500 MG PO TB24: 500.0000 mg | ORAL_TABLET | Freq: Two times a day (BID) | ORAL | 0 refills | Status: DC

## 2023-02-12 MED ORDER — LANTUS SOLOSTAR 100 UNIT/ML ~~LOC~~ SOPN: 15.0000 [IU] | PEN_INJECTOR | Freq: Every day | SUBCUTANEOUS | 3 refills | Status: DC

## 2023-02-12 NOTE — Progress Notes (Signed)
Endocrinology Follow Up Note       02/12/2023, 4:57 PM    Subjective:    Patient ID: Casey Mason, female    DOB: [birth year].  Casey Mason is being seen in follow up after being seen in consultation for management of currently uncontrolled symptomatic diabetes requested by  Selinda Flavin, MD.   Past Medical History:  Diagnosis Date   Diabetes mellitus without complication [facility])    Diverticulosis of colon    Family history of adverse reaction to anesthesia    mother-- ponv   History of colon polyps    2016- hyperplastic   History of diverticulitis of colon    Nephrolithiasis    right   PONV (postoperative nausea and vomiting)    Prolapse of female bladder, acquired    Right ureteral stone     Past Surgical History:  Procedure Laterality Date   BLADDER SUSPENSION     CESAREAN SECTION  1988   COLONOSCOPY  11-10-2014  in [place]   CORONARY STENT INTERVENTION N/A 11/14/2018   Procedure: CORONARY STENT INTERVENTION;  Surgeon: Lyn Records, MD;  Location: [facility];  Service: Cardiovascular;  Laterality: N/A;   CORONARY/GRAFT ACUTE MI REVASCULARIZATION N/A 11/14/2018   Procedure: Coronary/Graft Acute MI Revascularization;  Surgeon: Lyn Records, MD;  Location: [facility];  Service: Cardiovascular;  Laterality: N/A;   CYSTOSCOPY WITH RETROGRADE PYELOGRAM, URETEROSCOPY AND STENT PLACEMENT Right 02/07/2016   Procedure: CYSTOSCOPY WITH RIGHT PYLEOGRAM  RIGHT URETEROSCOPY, , DIALATION OF RIGHT URETER STRICUTURE FLEXIBLE RIGHT URETEROSCOPY, RIGHT PYELOSCOPY,  AND RIGHT DOUBLE STENT PLACEMENT WITH TETHER;  Surgeon: Jethro Bolus, MD;  Location: [facility];  Service: Urology;  Laterality: Right;   EXTRACORPOREAL SHOCK WAVE LITHOTRIPSY Right 09/20/2015   LEFT HEART CATH AND CORONARY ANGIOGRAPHY N/A 11/14/2018   Procedure: LEFT HEART CATH AND CORONARY ANGIOGRAPHY;  Surgeon: Lyn Records, MD;  Location: [facility];  Service: Cardiovascular;  Laterality: N/A;   ROTATOR CUFF REPAIR Right 2013 or 2014??   TONSILLECTOMY  2003   TUBAL LIGATION  1992   VAGINAL HYSTERECTOMY  2005    Social History   Socioeconomic History   Marital status: Married    Spouse name: Verdon Cummins   Number of children: 2   Years of education: 14   Highest education level: Associate degree: occupational, Scientist, product/process development, or vocational program  Occupational History   Not on file  Tobacco Use   Smoking status: Former    Current packs/day: 0.00    Average packs/day: 0.5 packs/day for 15.0 years (7.5 ttl pk-yrs)    Types: Cigarettes    Start date: 11/14/2003    Quit date: 11/14/2018    Years since quitting: 4.2   Smokeless tobacco: Never  Vaping Use   Vaping status: Never Used  Substance and Sexual Activity   Alcohol use: Yes    Alcohol/week: 0.0 standard drinks of alcohol    Comment: occasional   Drug use: No   Sexual activity: Not on file  Other Topics Concern   Not on file  Social History Narrative   Not on file   Social Determinants of Health   Financial  Resource Strain: Low Risk  (12/21/2018)   Overall Financial Resource Strain (CARDIA)    Difficulty of Paying Living Expenses: Not hard at all  Food Insecurity: No Food Insecurity (12/21/2018)   Hunger Vital Sign    Worried About Running Out of Food in the Last Year: Never true    Ran Out of Food in the Last Year: Never true  Transportation Needs: No Transportation Needs (12/21/2018)   PRAPARE - Administrator, Civil Service (Medical): No    Lack of Transportation (Non-Medical): No  Physical Activity: Sufficiently Active (12/21/2018)   Exercise Vital Sign    Days of Exercise per Week: 6 days    Minutes of Exercise per Session: 30 min  Stress: Stress Concern Present (12/21/2018)   [employer] of Occupational Health - Occupational Stress Questionnaire    Feeling of Stress : To some extent  Social Connections:  Unknown (08/05/2021)   Received from [facility]   Social Network    Social Network: Not on file    Family History  Problem Relation Age of Onset   Anemia Mother    Emphysema Mother    Osteoporosis Mother    Stomach cancer Neg Hx    Esophageal cancer Neg Hx     Outpatient Encounter Medications as of 02/12/2023  Medication Sig   atorvastatin (LIPITOR) 40 MG tablet Take 1 tablet (40 mg total) by mouth daily.   blood glucose meter kit and supplies KIT 1 each by Does not apply route 4 (four) times daily. Dispense based on patient and insurance preference. Use up to four times daily as directed.   Blood Glucose Monitoring Suppl (ACCU-CHEK GUIDE ME) w/Device KIT USE TO CHECK GLUCOSE 4 TIMES A DAY   Cholecalciferol (VITAMIN D3) 2000 units TABS Take 2 tablets by mouth daily.    clopidogrel (PLAVIX) 75 MG tablet Take 1 tablet (75 mg total) by mouth daily.   Continuous Glucose Sensor (DEXCOM G7 SENSOR) MISC Inject 1 Application into the skin as directed. Change sensor every 10 days as directed.   furosemide (LASIX) 20 MG tablet Take 1 tablet (20 mg total) by mouth as needed. Once daily as needed for edema   glucose blood (ONETOUCH VERIO) test strip Use as instructed   Multiple Vitamin (MULTIVITAMIN) tablet Take 1 tablet by mouth daily.   mupirocin ointment (BACTROBAN) 2 % SMARTSIG:1 Application Topical 2-3 Times Daily   nitroGLYCERIN (NITROSTAT) 0.4 MG SL tablet Place 1 tablet (0.4 mg total) under the tongue every 5 (five) minutes x 3 doses as needed for chest pain.   OneTouch Delica Lancets 30G MISC USE TO CHECK BLOOD GLUCOSE FOUR TIMES DAILY   vitamin B-12 (CYANOCOBALAMIN) 100 MCG tablet Take 100 mcg by mouth daily.   [DISCONTINUED] glucose blood test strip 1 each by Other route 4 (four) times daily. Use as instructed   [DISCONTINUED] insulin glargine (LANTUS SOLOSTAR) 100 UNIT/ML Solostar Pen Inject 12 Units into the skin at bedtime.   [DISCONTINUED] insulin lispro (HUMALOG  KWIKPEN) 100 UNIT/ML KwikPen Inject 2-5 Units into the skin 3 (three) times daily.   [DISCONTINUED] Insulin Pen Needle (BD PEN NEEDLE NANO 2ND GEN) 32G X 4 MM MISC USE TO INJECT INSULIN 4 TIMES DAILY   [DISCONTINUED] metFORMIN (GLUCOPHAGE-XR) 500 MG 24 hr tablet TAKE 1 TABLET BY MOUTH TWICE A DAY WITH FOOD   insulin glargine (LANTUS SOLOSTAR) 100 UNIT/ML Solostar Pen Inject 15 Units into the skin at bedtime.   insulin lispro (HUMALOG KWIKPEN) 100 UNIT/ML  KwikPen Inject 4-10 Units into the skin 3 (three) times daily.   Insulin Pen Needle (BD PEN NEEDLE NANO 2ND GEN) 32G X 4 MM MISC Use to inject insulin 4 times daily   metFORMIN (GLUCOPHAGE-XR) 500 MG 24 hr tablet Take 1 tablet (500 mg total) by mouth 2 (two) times daily with a meal.   [DISCONTINUED] insulin glargine (LANTUS SOLOSTAR) 100 UNIT/ML Solostar Pen Inject 15 Units into the skin at bedtime.   [DISCONTINUED] insulin lispro (HUMALOG KWIKPEN) 100 UNIT/ML KwikPen Inject 4-10 Units into the skin 3 (three) times daily.   No facility-administered encounter medications on file as of 02/12/2023.    ALLERGIES: Allergies  Allergen Reactions   Oxycodone Itching   Sulfa Antibiotics Itching   Sulfur     Other reaction(s): Unknown    VACCINATION STATUS: Immunization History  Administered Date(s) Administered   Hepatitis A, Adult 03/17/2018   PFIZER(Purple Top)SARS-COV-2 Vaccination 12/29/2019   Typhoid Live 03/17/2018    Diabetes She presents for her follow-up diabetic visit. She has type 1 (recently reclassified as LADA) diabetes mellitus. Onset time: diagnosed at approx age of 66. Her disease course has been worsening. There are no hypoglycemic associated symptoms. There are no diabetic associated symptoms. There are no hypoglycemic complications. Symptoms are stable. Diabetic complications include heart disease. Risk factors for coronary artery disease include diabetes mellitus, dyslipidemia, family history, hypertension and tobacco  exposure. Current diabetic treatment includes intensive insulin program and oral agent (monotherapy). She is compliant with treatment most of the time. Her weight is fluctuating minimally. She is following a generally healthy diet. Meal planning includes avoidance of concentrated sweets. She has had a previous visit with a dietitian (has seen dietician in the past). She participates in exercise intermittently. Her home blood glucose trend is increasing steadily. Her overall blood glucose range is >200 mg/dl. (She presents today with her meter, no logs, showing above target glycemic profile overall.  Her POCT A1c today is 8.3%, increasing from previous visit of 7.5%.  She did have kidney stone removal and had steroid injection in her shoulder since last visit.  She is frustrated that her glucose is now out of control when nothing else with diet or activity level has changed.) An ACE inhibitor/angiotensin II receptor blocker is not being taken. She does not see a podiatrist.Eye exam is current.  Hyperlipidemia This is a chronic problem. The current episode started more than 1 year ago. The problem is controlled. Recent lipid tests were reviewed and are normal. Exacerbating diseases include diabetes. There are no known factors aggravating her hyperlipidemia. Current antihyperlipidemic treatment includes statins. The current treatment provides moderate improvement of lipids. There are no compliance problems.  Risk factors for coronary artery disease include diabetes mellitus, dyslipidemia and family history.    Review of systems  Constitutional: +increasing body weight,  current Body mass index is 25.27 kg/m. , no fatigue, no subjective hyperthermia, no subjective hypothermia Eyes: no blurry vision, no xerophthalmia ENT: no sore throat, no nodules palpated in throat, no dysphagia/odynophagia, no hoarseness Cardiovascular: no chest pain, no shortness of breath, no palpitations, no leg swelling Respiratory: no  cough, no shortness of breath Gastrointestinal: no nausea/vomiting/diarrhea Musculoskeletal: no muscle/joint aches Skin: no rashes, no hyperemia Neurological: no tremors, no numbness, no tingling, no dizziness Psychiatric: no depression, no anxiety  Objective:     BP 115/75 (BP Location: Right Arm, Patient Position: Sitting, Cuff Size: Large)   Pulse 88   Ht 5' 5.5" (1.664 m)   Wt 154 lb  3.2 oz (69.9 kg)   BMI 25.27 kg/m   Wt Readings from Last 3 Encounters:  02/12/23 154 lb 3.2 oz (69.9 kg)  10/09/22 147 lb 12.8 oz (67 kg)  09/29/22 146 lb 6.4 oz (66.4 kg)     BP Readings from Last 3 Encounters:  02/12/23 115/75  10/09/22 102/66  09/29/22 108/66      Physical Exam- Limited  Constitutional:  Body mass index is 25.27 kg/m. , not in acute distress, normal state of mind Eyes:  EOMI, no exophthalmos Musculoskeletal: no gross deformities, strength intact in all four extremities, no gross restriction of joint movements Skin:  no rashes, no hyperemia Neurological: no tremor with outstretched hands   Diabetic Foot Exam - Simple   No data filed     CMP ( most recent) CMP     Component Value Date/Time   NA 143 07/22/2021 0754   K 4.5 07/22/2021 0754   CL 108 (H) 07/22/2021 0754   CO2 23 07/22/2021 0754   GLUCOSE 175 (H) 07/22/2021 0754   GLUCOSE 185 (H) 11/16/2018 0035   BUN 17 07/22/2021 0754   CREATININE 0.72 07/22/2021 0754   CALCIUM 9.8 07/22/2021 0754   PROT 6.3 07/22/2021 0754   ALBUMIN 4.4 07/22/2021 0754   AST 22 07/22/2021 0754   ALT 21 07/22/2021 0754   ALKPHOS 103 07/22/2021 0754   BILITOT 0.3 07/22/2021 0754   GFRNONAA 105 03/13/2021 0000   GFRAA >60 11/16/2018 0035     Diabetic Labs (most recent): Lab Results  Component Value Date   HGBA1C 8.3 (A) 02/12/2023   HGBA1C 7.5 (A) 10/09/2022   HGBA1C 7.6 (A) 06/05/2022   MICROALBUR 10 06/05/2022   MICROALBUR 10 05/01/2021     Lipid Panel ( most recent) Lipid Panel     Component Value  Date/Time   CHOL 79 (L) 01/17/2019 0736   TRIG 86 08/29/2020 0000   HDL 33 (L) 01/17/2019 0736   CHOLHDL 2.4 01/17/2019 0736   CHOLHDL 3.4 11/15/2018 0808   VLDL 18 11/15/2018 0808   LDLCALC 35 08/29/2020 0000   LDLCALC 33 01/17/2019 0736   LABVLDL 13 01/17/2019 0736      No results found for: "TSH", "FREET4"       Latest Reference Range & Units 08/06/21 14:51 08/06/21 15:42  Hemoglobin A1C 4.0 - 5.6 % 10.4   ZNT8 Antibodies U/mL  <15  Anti GAD 65 Antibodies U/mL  >120 (H)  IA-2 Autoantibodies U/mL  <7.5  Type 1 Diabetes Interpretation   Comment  (H): Data is abnormally high    Assessment & Plan:   1) Latent Autoimmune Diabetes in Adults  She presents today with her meter, no logs, showing above target glycemic profile overall.  Her POCT A1c today is 8.3%, increasing from previous visit of 7.5%.  She did have kidney stone removal and had steroid injection in her shoulder since last visit.  She is frustrated that her glucose is now out of control when nothing else with diet or activity level has changed.  Casey Mason has currently uncontrolled symptomatic LADA (newly discovered) since 58 years of age.  -Recent labs reviewed.  - I had a long discussion with her about the progressive nature of diabetes and the pathology behind its complications. -her diabetes is complicated by CAD with MI and she remains at a high risk for more acute and chronic complications which include CAD, CVA, CKD, retinopathy, and neuropathy. These are all discussed in detail with her.  The following Lifestyle Medicine recommendations according to American College of Lifestyle Medicine [facility]) were discussed and offered to patient and she agrees to start the journey:  A. Whole Foods, Plant-based plate comprising of fruits and vegetables, plant-based proteins, whole-grain carbohydrates was discussed in detail with the patient.   A list for source of those nutrients were also provided to the patient.   Patient will use only water or unsweetened tea for hydration. B.  The need to stay away from risky substances including alcohol, smoking; obtaining 7 to 9 hours of restorative sleep, at least 150 minutes of moderate intensity exercise weekly, the importance of healthy social connections,  and stress reduction techniques were discussed. C.  A full color page of  Calorie density of various food groups per pound showing examples of each food groups was provided to the patient.  - Nutritional counseling repeated at each appointment due to patients tendency to fall back in to old habits.  - The patient admits there is a room for improvement in their diet and drink choices. -  Suggestion is made for the patient to avoid simple carbohydrates from their diet including Cakes, Sweet Desserts / Pastries, Ice Cream, Soda (diet and regular), Sweet Tea, Candies, Chips, Cookies, Sweet Pastries, Store Bought Juices, Alcohol in Excess of 1-2 drinks a day, Artificial Sweeteners, Coffee Creamer, and "Sugar-free" Products. This will help patient to have stable blood glucose profile and potentially avoid unintended weight gain.   - I encouraged the patient to switch to unprocessed or minimally processed complex starch and increased protein intake (animal or plant source), fruits, and vegetables.   - Patient is advised to stick to a routine mealtimes to eat 3 meals a day and avoid unnecessary snacks (to snack only to correct hypoglycemia).  - she will be scheduled with Norm Salt, RDN, CDE for diabetes education.  - I have approached her with the following individualized plan to manage her diabetes and patient agrees:   -She is advised to increase her Lantus to 15 units SQ nightly and increase her Humalog 4-10 units TID with meals if glucose is above 90 and she is eating (Specific instructions on how to titrate insulin dosage based on glucose readings given to patient in writing). She can continue her Metformin 500  mg ER twice daily for now.  -She is encouraged to continue monitoring blood glucose 4 times daily, before meals and before bed, and to call the clinic if she has readings less than 70 or above 200 for 3 tests in a row.  She had CGM in the past but did not like it due to the alarms.   I did give her sample Dexcom G7 and meter to try.  I did give her some information on different insulin pumps to look into if she is interested.  - she is not an ideal candidate for incretin therapy due to body habitus or LADA diagnosis.  - Specific targets for  A1c; LDL, HDL, and Triglycerides were discussed with the patient.  2) Blood Pressure /Hypertension:  her blood pressure is controlled to target without the use of antihypertensive medications.    3) Lipids/Hyperlipidemia:    Review of her recent lipid panel from 12/31/21 showed controlled LDL at 33 .  she is advised to continue Lipitor 40 mg daily at bedtime.  Side effects and precautions discussed with her.  4)  Weight/Diet:  her Body mass index is 25.27 kg/m.  -    she is NOT a candidate  for weight loss. Exercise, and detailed carbohydrates information provided  -  detailed on discharge instructions.  5) Hypercalcemia-resolved: She is known to have several instances of elevated blood calcium levels.  She also has history of osteoporosis.  She is on calcium supplement and eats calcium rich foods routinely.  She may benefit from further work up to rule out parathyroid involvement.  We discussed this today.  I added some additional blood work to assess this further.  Her blood work shows normal parathyroid function and calcium levels.  No further work up needed.  6) Chronic Care/Health Maintenance: -she is not on ACEI/ARB and is on Statin medications and is encouraged to initiate and continue to follow up with Ophthalmology, Dentist, Podiatrist at least yearly or according to recommendations, and advised to stay away from smoking (quit a very long time  ago). I have recommended yearly flu vaccine and pneumonia vaccine at least every 5 years; moderate intensity exercise for up to 150 minutes weekly; and sleep for at least 7 hours a day.  - she is advised to maintain close follow up with Selinda Flavin, MD for primary care needs, as well as her other providers for optimal and coordinated care.     I spent  48  minutes in the care of the patient today including review of labs from CMP, Lipids, Thyroid Function, Hematology (current and previous including abstractions from other facilities); face-to-face time discussing  her blood glucose readings/logs, discussing hypoglycemia and hyperglycemia episodes and symptoms, medications doses, her options of short and long term treatment based on the latest standards of care / guidelines;  discussion about incorporating lifestyle medicine;  and documenting the encounter. Risk reduction counseling performed per USPSTF guidelines to reduce obesity and cardiovascular risk factors.     Please refer to Patient Instructions for Blood Glucose Monitoring and Insulin/Medications Dosing Guide"  in media tab for additional information. Please  also refer to " Patient Self Inventory" in the Media  tab for reviewed elements of pertinent patient history.  Casey Mason participated in the discussions, expressed understanding, and voiced agreement with the above plans.  All questions were answered to her satisfaction. she is encouraged to contact clinic should she have any questions or concerns prior to her return visit.     Follow up plan: - Return in about 3 months (around 05/15/2023) for Diabetes F/U with A1c in office, No previsit labs, Bring meter and logs.   Ronny Bacon, [facility] [address] [place], [place] [address] Phone: [phone] Fax: [phone]  02/12/2023, 4:57 PM

## 2023-02-19 ENCOUNTER — Encounter: Payer: Self-pay | Admitting: Nurse Practitioner

## 2023-02-19 MED ORDER — DEXCOM G7 SENSOR MISC: 1.0000 | 3 refills | Status: DC

## 2023-02-19 MED ORDER — LANTUS SOLOSTAR 100 UNIT/ML ~~LOC~~ SOPN: 18.0000 [IU] | PEN_INJECTOR | Freq: Every day | SUBCUTANEOUS | Status: DC

## 2023-03-10 ENCOUNTER — Telehealth: Payer: Self-pay

## 2023-03-10 NOTE — Telephone Encounter (Signed)
Per patient request, called [facility] (Dr. Bryon Lions office, [phone]) regarding values from recent lab draw. Spoke with Renelda Loma, receptionist, who confirmed that the patient was drawn on 02/18/23 and that the patient's calcium value on that date was 10.2 (normal range for test listed as 8.5-10.2). Fleet Contras stated that she would fax the labwork to our infusion center. Fax number provided.  Wyvonne Lenz, RN

## 2023-03-11 ENCOUNTER — Other Ambulatory Visit: Payer: Self-pay

## 2023-03-11 ENCOUNTER — Ambulatory Visit: Payer: 59

## 2023-03-11 VITALS — BP 118/77 | HR 68 | Temp 97.8°F | Resp 16 | Ht 65.0 in | Wt 157.0 lb

## 2023-03-11 DIAGNOSIS — M810 Age-related osteoporosis without current pathological fracture: Secondary | ICD-10-CM

## 2023-03-11 MED ORDER — DENOSUMAB 60 MG/ML ~~LOC~~ SOSY
60.0000 mg | PREFILLED_SYRINGE | Freq: Once | SUBCUTANEOUS | Status: AC
  Administered 2023-03-11: 60 mg via SUBCUTANEOUS
  Filled 2023-03-11: qty 1

## 2023-03-11 NOTE — Progress Notes (Signed)
Diagnosis: Osteoporosis  Provider:  Chilton Greathouse MD  Procedure: Injection  Prolia (Denosumab), Dose: 60 mg, Site: subcutaneous, Number of injections: 1  Injection Site(s): Right arm  Post Care: Patient declined observation  Discharge: Condition: Good, Destination: Home . AVS Declined  Performed by:  Loney Hering, LPN

## 2023-04-02 ENCOUNTER — Telehealth: Payer: Self-pay

## 2023-04-02 NOTE — Telephone Encounter (Signed)
 Auth Submission: NO AUTH NEEDED Site of care: Site of care: [facility] Payer: [employer] Medication & CPT/J Code(s) submitted: Prolia  (Denosumab ) [id number] Route of submission (phone, fax, portal): portal Phone # Fax # Auth type: Buy/Bill PB Units/visits requested: 60mg , q47months Reference number: [id number] Approval from: 04/02/23 to 03/30/24

## 2023-05-06 ENCOUNTER — Other Ambulatory Visit: Payer: Self-pay

## 2023-05-06 ENCOUNTER — Encounter: Payer: Self-pay | Admitting: Nurse Practitioner

## 2023-05-06 MED ORDER — INSULIN LISPRO (1 UNIT DIAL) 100 UNIT/ML (KWIKPEN): 4.0000 [IU] | PEN_INJECTOR | Freq: Three times a day (TID) | SUBCUTANEOUS | 1 refills | Status: DC

## 2023-05-06 MED ORDER — ONETOUCH VERIO VI STRP: ORAL_STRIP | 1 refills | Status: DC

## 2023-05-06 MED ORDER — BD PEN NEEDLE NANO 2ND GEN 32G X 4 MM MISC: 1 refills | Status: DC

## 2023-05-06 MED ORDER — METFORMIN HCL ER 500 MG PO TB24: 500.0000 mg | ORAL_TABLET | Freq: Two times a day (BID) | ORAL | 1 refills | Status: DC

## 2023-05-06 MED ORDER — LANTUS SOLOSTAR 100 UNIT/ML ~~LOC~~ SOPN: 18.0000 [IU] | PEN_INJECTOR | Freq: Every day | SUBCUTANEOUS | 1 refills | Status: DC

## 2023-05-06 MED ORDER — DEXCOM G7 SENSOR MISC: 1.0000 | 3 refills | Status: AC

## 2023-05-06 MED ORDER — ONETOUCH VERIO VI STRP: ORAL_STRIP | 6 refills | Status: DC

## 2023-05-06 MED ORDER — INSULIN LISPRO (1 UNIT DIAL) 100 UNIT/ML (KWIKPEN): 4.0000 [IU] | PEN_INJECTOR | Freq: Three times a day (TID) | SUBCUTANEOUS | 3 refills | Status: DC

## 2023-05-06 MED ORDER — LANTUS SOLOSTAR 100 UNIT/ML ~~LOC~~ SOPN: 18.0000 [IU] | PEN_INJECTOR | Freq: Every day | SUBCUTANEOUS | 3 refills | Status: DC

## 2023-05-14 ENCOUNTER — Encounter: Payer: Self-pay | Admitting: Pulmonary Disease

## 2023-05-18 ENCOUNTER — Encounter: Payer: Self-pay | Admitting: Pulmonary Disease

## 2023-05-21 ENCOUNTER — Encounter: Payer: Self-pay | Admitting: Pulmonary Disease

## 2023-05-21 ENCOUNTER — Encounter: Payer: Self-pay | Admitting: Nurse Practitioner

## 2023-05-21 ENCOUNTER — Telehealth: Payer: Self-pay | Admitting: Nurse Practitioner

## 2023-05-21 ENCOUNTER — Ambulatory Visit: Payer: BC Managed Care – PPO | Admitting: Nurse Practitioner

## 2023-05-21 VITALS — BP 116/74 | HR 70 | Ht 65.0 in | Wt 162.6 lb

## 2023-05-21 DIAGNOSIS — E139 Other specified diabetes mellitus without complications: Secondary | ICD-10-CM

## 2023-05-21 DIAGNOSIS — Z794 Long term (current) use of insulin: Secondary | ICD-10-CM | POA: Diagnosis not present

## 2023-05-21 DIAGNOSIS — E782 Mixed hyperlipidemia: Secondary | ICD-10-CM

## 2023-05-21 LAB — POCT GLYCOSYLATED HEMOGLOBIN (HGB A1C): Hemoglobin A1C: 7.9 % — AB (ref 4.0–5.6)

## 2023-05-21 LAB — POCT UA - MICROALBUMIN
Albumin/Creatinine Ratio, Urine, POC: <30
Creatinine, POC: 200 mg/dL
Microalbumin Ur, POC: 10 mg/L

## 2023-05-21 MED ORDER — LANTUS SOLOSTAR 100 UNIT/ML ~~LOC~~ SOPN: 20.0000 [IU] | PEN_INJECTOR | Freq: Every day | SUBCUTANEOUS | Status: DC

## 2023-05-21 MED ORDER — INSULIN LISPRO (1 UNIT DIAL) 100 UNIT/ML (KWIKPEN)
6.0000 [IU] | PEN_INJECTOR | Freq: Three times a day (TID) | SUBCUTANEOUS | Status: AC
Start: 2023-05-21 — End: ?

## 2023-05-21 NOTE — Progress Notes (Signed)
Endocrinology Follow Up Note       05/21/2023, 12:06 PM    Subjective:    Patient ID: Casey Mason, female    DOB: July 03, 1965.  Casey Mason is being seen in follow up after being seen in consultation for management of currently uncontrolled symptomatic diabetes requested by  Selinda Flavin, MD.   Past Medical History:  Diagnosis Date   Diabetes mellitus without complication [facility])    Diverticulosis of colon    Family history of adverse reaction to anesthesia    mother-- ponv   History of colon polyps    2016- hyperplastic   History of diverticulitis of colon    Nephrolithiasis    right   PONV (postoperative nausea and vomiting)    Prolapse of female bladder, acquired    Right ureteral stone     Past Surgical History:  Procedure Laterality Date   BLADDER SUSPENSION     CESAREAN SECTION  1988   COLONOSCOPY  11-10-2014  in [place]   CORONARY STENT INTERVENTION N/A 11/14/2018   Procedure: CORONARY STENT INTERVENTION;  Surgeon: Lyn Records, MD;  Location: [facility];  Service: Cardiovascular;  Laterality: N/A;   CORONARY/GRAFT ACUTE MI REVASCULARIZATION N/A 11/14/2018   Procedure: Coronary/Graft Acute MI Revascularization;  Surgeon: Lyn Records, MD;  Location: [facility];  Service: Cardiovascular;  Laterality: N/A;   CYSTOSCOPY WITH RETROGRADE PYELOGRAM, URETEROSCOPY AND STENT PLACEMENT Right 02/07/2016   Procedure: CYSTOSCOPY WITH RIGHT PYLEOGRAM  RIGHT URETEROSCOPY, , DIALATION OF RIGHT URETER STRICUTURE FLEXIBLE RIGHT URETEROSCOPY, RIGHT PYELOSCOPY,  AND RIGHT DOUBLE STENT PLACEMENT WITH TETHER;  Surgeon: Jethro Bolus, MD;  Location: [facility];  Service: Urology;  Laterality: Right;   EXTRACORPOREAL SHOCK WAVE LITHOTRIPSY Right 09/20/2015   LEFT HEART CATH AND CORONARY ANGIOGRAPHY N/A 11/14/2018   Procedure: LEFT HEART CATH AND CORONARY ANGIOGRAPHY;  Surgeon: Lyn Records, MD;  Location: [facility];  Service: Cardiovascular;  Laterality: N/A;   ROTATOR CUFF REPAIR Right 2013 or 2014??   TONSILLECTOMY  2003   TUBAL LIGATION  1992   VAGINAL HYSTERECTOMY  2005    Social History   Socioeconomic History   Marital status: Married    Spouse name: Verdon Cummins   Number of children: 2   Years of education: 14   Highest education level: Associate degree: occupational, Scientist, product/process development, or vocational program  Occupational History   Not on file  Tobacco Use   Smoking status: Former    Current packs/day: 0.00    Average packs/day: 0.5 packs/day for 15.0 years (7.5 ttl pk-yrs)    Types: Cigarettes    Start date: 11/14/2003    Quit date: 11/14/2018    Years since quitting: 4.5   Smokeless tobacco: Never  Vaping Use   Vaping status: Never Used  Substance and Sexual Activity   Alcohol use: Yes    Alcohol/week: 0.0 standard drinks of alcohol    Comment: occasional   Drug use: No   Sexual activity: Not on file  Other Topics Concern   Not on file  Social History Narrative   Not on file   Social Drivers of Health   Financial  Resource Strain: Low Risk  (12/21/2018)   Overall Financial Resource Strain (CARDIA)    Difficulty of Paying Living Expenses: Not hard at all  Food Insecurity: No Food Insecurity (12/21/2018)   Hunger Vital Sign    Worried About Running Out of Food in the Last Year: Never true    Ran Out of Food in the Last Year: Never true  Transportation Needs: No Transportation Needs (12/21/2018)   PRAPARE - Administrator, Civil Service (Medical): No    Lack of Transportation (Non-Medical): No  Physical Activity: Sufficiently Active (12/21/2018)   Exercise Vital Sign    Days of Exercise per Week: 6 days    Minutes of Exercise per Session: 30 min  Stress: Stress Concern Present (12/21/2018)   [employer] of Occupational Health - Occupational Stress Questionnaire    Feeling of Stress : To some extent  Social Connections: Unknown  (08/05/2021)   Received from [facility]   Social Network    Social Network: Not on file    Family History  Problem Relation Age of Onset   Anemia Mother    Emphysema Mother    Osteoporosis Mother    Stomach cancer Neg Hx    Esophageal cancer Neg Hx     Outpatient Encounter Medications as of 05/21/2023  Medication Sig   atorvastatin (LIPITOR) 40 MG tablet Take 1 tablet (40 mg total) by mouth daily.   blood glucose meter kit and supplies KIT 1 each by Does not apply route 4 (four) times daily. Dispense based on patient and insurance preference. Use up to four times daily as directed.   Blood Glucose Monitoring Suppl (ACCU-CHEK GUIDE ME) w/Device KIT USE TO CHECK GLUCOSE 4 TIMES A DAY   Cholecalciferol (VITAMIN D3) 2000 units TABS Take 2 tablets by mouth daily.    clopidogrel (PLAVIX) 75 MG tablet Take 1 tablet (75 mg total) by mouth daily.   Continuous Glucose Sensor (DEXCOM G7 SENSOR) MISC Inject 1 Application into the skin as directed. Change sensor every 10 days as directed.   furosemide (LASIX) 20 MG tablet Take 1 tablet (20 mg total) by mouth as needed. Once daily as needed for edema   glucose blood (ONETOUCH VERIO) test strip Use to check glucose 4 times daily   Insulin Pen Needle (BD PEN NEEDLE NANO 2ND GEN) 32G X 4 MM MISC Use to inject insulin 4 times daily   metFORMIN (GLUCOPHAGE-XR) 500 MG 24 hr tablet Take 1 tablet (500 mg total) by mouth 2 (two) times daily with a meal.   Multiple Vitamin (MULTIVITAMIN) tablet Take 1 tablet by mouth daily.   mupirocin ointment (BACTROBAN) 2 % SMARTSIG:1 Application Topical 2-3 Times Daily   nitroGLYCERIN (NITROSTAT) 0.4 MG SL tablet Place 1 tablet (0.4 mg total) under the tongue every 5 (five) minutes x 3 doses as needed for chest pain.   OneTouch Delica Lancets 30G MISC USE TO CHECK BLOOD GLUCOSE FOUR TIMES DAILY   vitamin B-12 (CYANOCOBALAMIN) 100 MCG tablet Take 100 mcg by mouth daily.   [DISCONTINUED] insulin glargine  (LANTUS SOLOSTAR) 100 UNIT/ML Solostar Pen Inject 18 Units into the skin at bedtime.   [DISCONTINUED] insulin lispro (HUMALOG KWIKPEN) 100 UNIT/ML KwikPen Inject 4-10 Units into the skin 3 (three) times daily.   insulin glargine (LANTUS SOLOSTAR) 100 UNIT/ML Solostar Pen Inject 20 Units into the skin at bedtime.   insulin lispro (HUMALOG KWIKPEN) 100 UNIT/ML KwikPen Inject 6-12 Units into the skin 3 (three) times daily.  No facility-administered encounter medications on file as of 05/21/2023.    ALLERGIES: Allergies  Allergen Reactions   Oxycodone Itching   Sulfa Antibiotics Itching   Sulfur     Other reaction(s): Unknown    VACCINATION STATUS: Immunization History  Administered Date(s) Administered   Hepatitis A, Adult 03/17/2018   PFIZER(Purple Top)SARS-COV-2 Vaccination 12/29/2019   Typhoid Live 03/17/2018    Diabetes She presents for her follow-up diabetic visit. She has type 1 (recently reclassified as LADA) diabetes mellitus. Onset time: diagnosed at approx age of 49. Her disease course has been improving. There are no hypoglycemic associated symptoms. There are no diabetic associated symptoms. There are no hypoglycemic complications. Symptoms are stable. Diabetic complications include heart disease. Risk factors for coronary artery disease include diabetes mellitus, dyslipidemia, family history, hypertension and tobacco exposure. Current diabetic treatment includes intensive insulin program and oral agent (monotherapy). She is compliant with treatment most of the time. Her weight is increasing steadily. She is following a generally healthy diet. Meal planning includes avoidance of concentrated sweets. She has had a previous visit with a dietitian (has seen dietician in the past). She participates in exercise intermittently. Her home blood glucose trend is increasing steadily. Her breakfast blood glucose range is generally 130-140 mg/dl. Her lunch blood glucose range is generally  140-180 mg/dl. Her dinner blood glucose range is generally >200 mg/dl. Her bedtime blood glucose range is generally >200 mg/dl. (She presents today with her meter, no logs, showing above target glycemic profile overall.  Her POCT A1c today is 7.9%, improving from previous visit of 8.3%.  She is frustrated that her glucose is now out of control when nothing else with diet or activity level has changed.  She notes her quality of life has changed dramatically since her diagnosis as she works hard to control her glucose but no matter how hard she tries, she has not been seeing desired results.  She is leaving for a cruise with her daughter soon.  She has not yet tried the Dexcom again, was waiting until she returned from her trip first.) An ACE inhibitor/angiotensin II receptor blocker is not being taken. She does not see a podiatrist.Eye exam is current.  Hyperlipidemia This is a chronic problem. The current episode started more than 1 year ago. The problem is controlled. Recent lipid tests were reviewed and are normal. Exacerbating diseases include diabetes. There are no known factors aggravating her hyperlipidemia. Current antihyperlipidemic treatment includes statins. The current treatment provides moderate improvement of lipids. There are no compliance problems.  Risk factors for coronary artery disease include diabetes mellitus, dyslipidemia and family history.    Review of systems  Constitutional: +increasing body weight- which frustrates her since she has been working out more and continuing to eat right,  current Body mass index is 27.06 kg/m. , no fatigue, no subjective hyperthermia, no subjective hypothermia Eyes: no blurry vision, no xerophthalmia ENT: no sore throat, no nodules palpated in throat, no dysphagia/odynophagia, no hoarseness Cardiovascular: no chest pain, no shortness of breath, no palpitations, no leg swelling Respiratory: no cough, no shortness of breath Gastrointestinal: no  nausea/vomiting/diarrhea Musculoskeletal: no muscle/joint aches Skin: no rashes, no hyperemia Neurological: no tremors, no numbness, no tingling, no dizziness Psychiatric: no depression, no anxiety  Objective:     BP 116/74 (BP Location: Left Arm, Patient Position: Sitting, Cuff Size: Large)   Pulse 70   Ht 5\' 5"  (1.651 m)   Wt 162 lb 9.6 oz (73.8 kg)   BMI 27.06 kg/m  Wt Readings from Last 3 Encounters:  05/21/23 162 lb 9.6 oz (73.8 kg)  03/11/23 157 lb (71.2 kg)  02/12/23 154 lb 3.2 oz (69.9 kg)     BP Readings from Last 3 Encounters:  05/21/23 116/74  03/11/23 118/77  02/12/23 115/75      Physical Exam- Limited  Constitutional:  Body mass index is 27.06 kg/m. , not in acute distress, normal state of mind Eyes:  EOMI, no exophthalmos Musculoskeletal: no gross deformities, strength intact in all four extremities, no gross restriction of joint movements Skin:  no rashes, no hyperemia Neurological: no tremor with outstretched hands   Diabetic Foot Exam - Simple   No data filed     CMP ( most recent) CMP     Component Value Date/Time   NA 143 07/22/2021 0754   K 4.5 07/22/2021 0754   CL 108 (H) 07/22/2021 0754   CO2 23 07/22/2021 0754   GLUCOSE 175 (H) 07/22/2021 0754   GLUCOSE 185 (H) 11/16/2018 0035   BUN 17 07/22/2021 0754   CREATININE 0.72 07/22/2021 0754   CALCIUM 9.8 07/22/2021 0754   PROT 6.3 07/22/2021 0754   ALBUMIN 4.4 07/22/2021 0754   AST 22 07/22/2021 0754   ALT 21 07/22/2021 0754   ALKPHOS 103 07/22/2021 0754   BILITOT 0.3 07/22/2021 0754   GFRNONAA 105 03/13/2021 0000   GFRAA >60 11/16/2018 0035     Diabetic Labs (most recent): Lab Results  Component Value Date   HGBA1C 7.9 (A) 05/21/2023   HGBA1C 8.3 (A) 02/12/2023   HGBA1C 7.5 (A) 10/09/2022   MICROALBUR 10 mg/L 05/21/2023   MICROALBUR 10 06/05/2022   MICROALBUR 10 05/01/2021     Lipid Panel ( most recent) Lipid Panel     Component Value Date/Time   CHOL 79 (L)  01/17/2019 0736   TRIG 86 08/29/2020 0000   HDL 33 (L) 01/17/2019 0736   CHOLHDL 2.4 01/17/2019 0736   CHOLHDL 3.4 11/15/2018 0808   VLDL 18 11/15/2018 0808   LDLCALC 35 08/29/2020 0000   LDLCALC 33 01/17/2019 0736   LABVLDL 13 01/17/2019 0736      No results found for: "TSH", "FREET4"       Latest Reference Range & Units 08/06/21 14:51 08/06/21 15:42  Hemoglobin A1C 4.0 - 5.6 % 10.4   ZNT8 Antibodies U/mL  <15  Anti GAD 65 Antibodies U/mL  >120 (H)  IA-2 Autoantibodies U/mL  <7.5  Type 1 Diabetes Interpretation   Comment  (H): Data is abnormally high    Assessment & Plan:   1) Latent Autoimmune Diabetes in Adults  She presents today with her meter, no logs, showing above target glycemic profile overall.  Her POCT A1c today is 7.9%, improving from previous visit of 8.3%.  She is frustrated that her glucose is now out of control when nothing else with diet or activity level has changed.  She notes her quality of life has changed dramatically since her diagnosis as she works hard to control her glucose but no matter how hard she tries, she has not been seeing desired results.  She is leaving for a cruise with her daughter soon.  She has not yet tried the Dexcom again, was waiting until she returned from her trip first.  - Casey Mason has currently uncontrolled symptomatic LADA (newly discovered) since 58 years of age.  -Recent labs reviewed.  POCT UM normal.  Says she had labs with PCP in November, will request copy.  - I  had a long discussion with her about the progressive nature of diabetes and the pathology behind its complications. -her diabetes is complicated by CAD with MI and she remains at a high risk for more acute and chronic complications which include CAD, CVA, CKD, retinopathy, and neuropathy. These are all discussed in detail with her.  The following Lifestyle Medicine recommendations according to American College of Lifestyle Medicine [facility]) were discussed and  offered to patient and she agrees to start the journey:  A. Whole Foods, Plant-based plate comprising of fruits and vegetables, plant-based proteins, whole-grain carbohydrates was discussed in detail with the patient.   A list for source of those nutrients were also provided to the patient.  Patient will use only water or unsweetened tea for hydration. B.  The need to stay away from risky substances including alcohol, smoking; obtaining 7 to 9 hours of restorative sleep, at least 150 minutes of moderate intensity exercise weekly, the importance of healthy social connections,  and stress reduction techniques were discussed. C.  A full color page of  Calorie density of various food groups per pound showing examples of each food groups was provided to the patient.  - Nutritional counseling repeated at each appointment due to patients tendency to fall back in to old habits.  - The patient admits there is a room for improvement in their diet and drink choices. -  Suggestion is made for the patient to avoid simple carbohydrates from their diet including Cakes, Sweet Desserts / Pastries, Ice Cream, Soda (diet and regular), Sweet Tea, Candies, Chips, Cookies, Sweet Pastries, Store Bought Juices, Alcohol in Excess of 1-2 drinks a day, Artificial Sweeteners, Coffee Creamer, and "Sugar-free" Products. This will help patient to have stable blood glucose profile and potentially avoid unintended weight gain.   - I encouraged the patient to switch to unprocessed or minimally processed complex starch and increased protein intake (animal or plant source), fruits, and vegetables.   - Patient is advised to stick to a routine mealtimes to eat 3 meals a day and avoid unnecessary snacks (to snack only to correct hypoglycemia).  - I have approached her with the following individualized plan to manage her diabetes and patient agrees:   -She is advised to increase her Lantus to 20 units SQ nightly and increase her Humalog  6-12 units TID with meals if glucose is above 90 and she is eating (Specific instructions on how to titrate insulin dosage based on glucose readings given to patient in writing). She can continue her Metformin 500 mg ER twice daily for now.  Will plan on repeating insulin and cpeptide level prior to next visit to see if her pancreas is still helping some.  -She is encouraged to continue monitoring blood glucose 4 times daily, before meals and before bed, and to call the clinic if she has readings less than 70 or above 200 for 3 tests in a row.  She had CGM in the past but did not like it due to the alarms.   I encouraged her to try it.  I did give her some information on different insulin pumps to look into if she is interested.  - she is not an ideal candidate for incretin therapy due to body habitus or LADA diagnosis.  - Specific targets for  A1c; LDL, HDL, and Triglycerides were discussed with the patient.  2) Blood Pressure /Hypertension:  her blood pressure is controlled to target without the use of antihypertensive medications.    3) Lipids/Hyperlipidemia:  Review of her recent lipid panel from 12/31/21 showed controlled LDL at 33 .  she is advised to continue Lipitor 40 mg daily at bedtime.  Side effects and precautions discussed with her.  4)  Weight/Diet:  her Body mass index is 27.06 kg/m.  -    she is NOT a candidate for weight loss. Exercise, and detailed carbohydrates information provided  -  detailed on discharge instructions.  5) Hypercalcemia-resolved: She is known to have several instances of elevated blood calcium levels.  She also has history of osteoporosis.  She is on calcium supplement and eats calcium rich foods routinely.  She may benefit from further work up to rule out parathyroid involvement.  We discussed this today.  I added some additional blood work to assess this further.  Her blood work shows normal parathyroid function and calcium levels.  No further work up  needed.  6) Chronic Care/Health Maintenance: -she is not on ACEI/ARB and is on Statin medications and is encouraged to initiate and continue to follow up with Ophthalmology, Dentist, Podiatrist at least yearly or according to recommendations, and advised to stay away from smoking (quit a very long time ago). I have recommended yearly flu vaccine and pneumonia vaccine at least every 5 years; moderate intensity exercise for up to 150 minutes weekly; and sleep for at least 7 hours a day.  - she is advised to maintain close follow up with Selinda Flavin, MD for primary care needs, as well as her other providers for optimal and coordinated care.      I spent  41  minutes in the care of the patient today including review of labs from CMP, Lipids, Thyroid Function, Hematology (current and previous including abstractions from other facilities); face-to-face time discussing  her blood glucose readings/logs, discussing hypoglycemia and hyperglycemia episodes and symptoms, medications doses, her options of short and long term treatment based on the latest standards of care / guidelines;  discussion about incorporating lifestyle medicine;  and documenting the encounter. Risk reduction counseling performed per USPSTF guidelines to reduce obesity and cardiovascular risk factors.     Please refer to Patient Instructions for Blood Glucose Monitoring and Insulin/Medications Dosing Guide"  in media tab for additional information. Please  also refer to " Patient Self Inventory" in the Media  tab for reviewed elements of pertinent patient history.  Fara Chute participated in the discussions, expressed understanding, and voiced agreement with the above plans.  All questions were answered to her satisfaction. she is encouraged to contact clinic should she have any questions or concerns prior to her return visit.     Follow up plan: - Return in about 3 months (around 08/18/2023) for Diabetes F/U with A1c in office,  Previsit labs, Bring meter and logs.   Ronny Bacon, [facility] [address] [place], [place] [address] Phone: [phone] Fax: [phone]  05/21/2023, 12:06 PM

## 2023-05-21 NOTE — Telephone Encounter (Signed)
A request has been faxed to [facility] for patient's lab work.

## 2023-05-22 ENCOUNTER — Other Ambulatory Visit: Payer: Self-pay

## 2023-05-22 MED ORDER — CLOPIDOGREL BISULFATE 75 MG PO TABS: 75.0000 mg | ORAL_TABLET | Freq: Every day | ORAL | 1 refills | Status: DC

## 2023-05-22 MED ORDER — FUROSEMIDE 20 MG PO TABS: 20.0000 mg | ORAL_TABLET | ORAL | 1 refills | Status: AC | PRN

## 2023-05-22 MED ORDER — ATORVASTATIN CALCIUM 40 MG PO TABS: 40.0000 mg | ORAL_TABLET | Freq: Every day | ORAL | 1 refills | Status: DC

## 2023-06-04 ENCOUNTER — Telehealth: Payer: Self-pay

## 2023-06-04 NOTE — Telephone Encounter (Signed)
 Auth Submission: APPROVED Site of care: Site of care: [facility] Payer: [employer] Medication & CPT/J Code(s) submitted: Prolia (Denosumab) [id number] Route of submission (phone, fax, portal): portal Phone # Fax # Auth type: Buy/Bill PB Units/visits requested: 60mg , q47months Reference number: [id number] Approval from: 06/04/23 to 06/02/24

## 2023-07-13 ENCOUNTER — Encounter: Payer: Self-pay | Admitting: Cardiology

## 2023-07-13 MED ORDER — NITROGLYCERIN 0.4 MG SL SUBL: 0.4000 mg | SUBLINGUAL_TABLET | SUBLINGUAL | 0 refills | Status: AC | PRN

## 2023-08-10 DIAGNOSIS — E139 Other specified diabetes mellitus without complications: Secondary | ICD-10-CM | POA: Diagnosis not present

## 2023-08-11 LAB — INSULIN AND C-PEPTIDE, SERUM
C-Peptide: 0.5 ng/mL — ABNORMAL LOW (ref 1.1–4.4)
INSULIN: 2.8 u[IU]/mL (ref 2.6–24.9)

## 2023-08-12 DIAGNOSIS — M810 Age-related osteoporosis without current pathological fracture: Secondary | ICD-10-CM | POA: Diagnosis not present

## 2023-08-18 ENCOUNTER — Other Ambulatory Visit: Payer: Self-pay

## 2023-08-20 ENCOUNTER — Encounter: Payer: Self-pay | Admitting: Nurse Practitioner

## 2023-08-20 ENCOUNTER — Ambulatory Visit: Payer: BC Managed Care – PPO | Admitting: Nurse Practitioner

## 2023-08-20 VITALS — BP 118/86 | HR 83 | Ht 65.5 in | Wt 164.6 lb

## 2023-08-20 DIAGNOSIS — E139 Other specified diabetes mellitus without complications: Secondary | ICD-10-CM | POA: Diagnosis not present

## 2023-08-20 DIAGNOSIS — E782 Mixed hyperlipidemia: Secondary | ICD-10-CM

## 2023-08-20 DIAGNOSIS — Z794 Long term (current) use of insulin: Secondary | ICD-10-CM | POA: Diagnosis not present

## 2023-08-20 LAB — POCT GLYCOSYLATED HEMOGLOBIN (HGB A1C): Hemoglobin A1C: 7.8 % — AB (ref 4.0–5.6)

## 2023-08-20 MED ORDER — WEGOVY 0.5 MG/0.5ML ~~LOC~~ SOAJ: 0.5000 mg | SUBCUTANEOUS | 0 refills | Status: DC

## 2023-08-20 MED ORDER — WEGOVY 0.25 MG/0.5ML ~~LOC~~ SOAJ: 0.2500 mg | SUBCUTANEOUS | 0 refills | Status: DC

## 2023-08-20 NOTE — Progress Notes (Signed)
 Endocrinology Follow Up Note       08/20/2023, 4:32 PM    Subjective:    Patient ID: Casey Mason, female    DOB: [birth year].  Casey Mason is being seen in follow up after being seen in consultation for management of currently uncontrolled symptomatic diabetes requested by  Belvie Boyers, MD.   Past Medical History:  Diagnosis Date   Diabetes mellitus without complication [facility])    Diverticulosis of colon    Family history of adverse reaction to anesthesia    mother-- ponv   History of colon polyps    2016- hyperplastic   History of diverticulitis of colon    Nephrolithiasis    right   PONV (postoperative nausea and vomiting)    Prolapse of female bladder, acquired    Right ureteral stone     Past Surgical History:  Procedure Laterality Date   BLADDER SUSPENSION     CESAREAN SECTION  1988   COLONOSCOPY  11-10-2014  in [place]   CORONARY STENT INTERVENTION N/A 11/14/2018   Procedure: CORONARY STENT INTERVENTION;  Surgeon: Arty Binning, MD;  Location: [facility];  Service: Cardiovascular;  Laterality: N/A;   CORONARY/GRAFT ACUTE MI REVASCULARIZATION N/A 11/14/2018   Procedure: Coronary/Graft Acute MI Revascularization;  Surgeon: Arty Binning, MD;  Location: [facility];  Service: Cardiovascular;  Laterality: N/A;   CYSTOSCOPY WITH RETROGRADE PYELOGRAM, URETEROSCOPY AND STENT PLACEMENT Right 02/07/2016   Procedure: CYSTOSCOPY WITH RIGHT PYLEOGRAM  RIGHT URETEROSCOPY, , DIALATION OF RIGHT URETER STRICUTURE FLEXIBLE RIGHT URETEROSCOPY, RIGHT PYELOSCOPY,  AND RIGHT DOUBLE STENT PLACEMENT WITH TETHER;  Surgeon: Annamarie Kid, MD;  Location: [facility];  Service: Urology;  Laterality: Right;   EXTRACORPOREAL SHOCK WAVE LITHOTRIPSY Right 09/20/2015   LEFT HEART CATH AND CORONARY ANGIOGRAPHY N/A 11/14/2018   Procedure: LEFT HEART CATH AND CORONARY ANGIOGRAPHY;  Surgeon: Arty Binning, MD;  Location: [facility];  Service: Cardiovascular;  Laterality: N/A;   ROTATOR CUFF REPAIR Right 2013 or 2014??   TONSILLECTOMY  2003   TUBAL LIGATION  1992   VAGINAL HYSTERECTOMY  2005    Social History   Socioeconomic History   Marital status: Married    Spouse name: Aleta Anda   Number of children: 2   Years of education: 14   Highest education level: Associate degree: occupational, Scientist, product/process development, or vocational program  Occupational History   Not on file  Tobacco Use   Smoking status: Former    Current packs/day: 0.00    Average packs/day: 0.5 packs/day for 15.0 years (7.5 ttl pk-yrs)    Types: Cigarettes    Start date: 11/14/2003    Quit date: 11/14/2018    Years since quitting: 4.7   Smokeless tobacco: Never  Vaping Use   Vaping status: Never Used  Substance and Sexual Activity   Alcohol  use: Yes    Alcohol /week: 0.0 standard drinks of alcohol     Comment: occasional   Drug use: No   Sexual activity: Not on file  Other Topics Concern   Not on file  Social History Narrative   Not on file   Social Drivers of Health   Financial  Resource Strain: Low Risk  (12/21/2018)   Overall Financial Resource Strain (CARDIA)    Difficulty of Paying Living Expenses: Not hard at all  Food Insecurity: No Food Insecurity (12/21/2018)   Hunger Vital Sign    Worried About Running Out of Food in the Last Year: Never true    Ran Out of Food in the Last Year: Never true  Transportation Needs: No Transportation Needs (12/21/2018)   PRAPARE - Administrator, Civil Service (Medical): No    Lack of Transportation (Non-Medical): No  Physical Activity: Sufficiently Active (12/21/2018)   Exercise Vital Sign    Days of Exercise per Week: 6 days    Minutes of Exercise per Session: 30 min  Stress: Stress Concern Present (12/21/2018)   [employer] of Occupational Health - Occupational Stress Questionnaire    Feeling of Stress : To some extent  Social Connections: Unknown  (08/05/2021)   Received from [facility]   Social Network    Social Network: Not on file    Family History  Problem Relation Age of Onset   Anemia Mother    Emphysema Mother    Osteoporosis Mother    Stomach cancer Neg Hx    Esophageal cancer Neg Hx     Outpatient Encounter Medications as of 08/20/2023  Medication Sig   atorvastatin  (LIPITOR ) 40 MG tablet Take 1 tablet (40 mg total) by mouth daily.   blood glucose meter kit and supplies KIT 1 each by Does not apply route 4 (four) times daily. Dispense based on patient and insurance preference. Use up to four times daily as directed.   Blood Glucose Monitoring Suppl (ACCU-CHEK GUIDE ME) w/Device KIT USE TO CHECK GLUCOSE 4 TIMES A DAY   Cholecalciferol (VITAMIN D3) 2000 units TABS Take 2 tablets by mouth daily.    clopidogrel  (PLAVIX ) 75 MG tablet Take 1 tablet (75 mg total) by mouth daily.   furosemide  (LASIX ) 20 MG tablet Take 1 tablet (20 mg total) by mouth as needed. Once daily as needed for edema   glucose blood (ONETOUCH VERIO) test strip Use to check glucose 4 times daily   insulin  glargine (LANTUS  SOLOSTAR) 100 UNIT/ML Solostar Pen Inject 20 Units into the skin at bedtime.   insulin  lispro (HUMALOG  KWIKPEN) 100 UNIT/ML KwikPen Inject 6-12 Units into the skin 3 (three) times daily.   Insulin  Pen Needle (BD PEN NEEDLE NANO 2ND GEN) 32G X 4 MM MISC Use to inject insulin  4 times daily   metFORMIN  (GLUCOPHAGE -XR) 500 MG 24 hr tablet Take 1 tablet (500 mg total) by mouth 2 (two) times daily with a meal.   Multiple Vitamin (MULTIVITAMIN) tablet Take 1 tablet by mouth daily.   mupirocin ointment (BACTROBAN) 2 % SMARTSIG:1 Application Topical 2-3 Times Daily   nitroGLYCERIN  (NITROSTAT ) 0.4 MG SL tablet Place 1 tablet (0.4 mg total) under the tongue every 5 (five) minutes x 3 doses as needed for chest pain.   OneTouch Delica Lancets 30G MISC USE TO CHECK BLOOD GLUCOSE FOUR TIMES DAILY   Semaglutide-Weight Management (WEGOVY)  0.25 MG/0.5ML SOAJ Inject 0.25 mg into the skin once a week.   [START ON 09/17/2023] Semaglutide-Weight Management (WEGOVY) 0.5 MG/0.5ML SOAJ Inject 0.5 mg into the skin once a week.   vitamin B-12 (CYANOCOBALAMIN) 100 MCG tablet Take 100 mcg by mouth daily.   Continuous Glucose Sensor (DEXCOM G7 SENSOR) MISC Inject 1 Application into the skin as directed. Change sensor every 10 days as directed. (Patient not taking:  Reported on 08/20/2023)   No facility-administered encounter medications on file as of 08/20/2023.    ALLERGIES: Allergies  Allergen Reactions   Oxycodone  Itching   Sulfa Antibiotics Itching   Sulfur     Other reaction(s): Unknown    VACCINATION STATUS: Immunization History  Administered Date(s) Administered   Hepatitis A, Adult 03/17/2018   PFIZER(Purple Top)SARS-COV-2 Vaccination 12/29/2019   Typhoid Live 03/17/2018    Diabetes She presents for her follow-up diabetic visit. She has type 1 (recently reclassified as LADA) diabetes mellitus. Onset time: diagnosed at approx age of 41. Her disease course has been improving. There are no hypoglycemic associated symptoms. There are no diabetic associated symptoms. There are no hypoglycemic complications. Symptoms are stable. Diabetic complications include heart disease. Risk factors for coronary artery disease include diabetes mellitus, dyslipidemia, family history, hypertension and tobacco exposure. Current diabetic treatment includes intensive insulin  program and oral agent (monotherapy). She is compliant with treatment most of the time. Her weight is increasing steadily. She is following a generally healthy diet. Meal planning includes avoidance of concentrated sweets, ADA exchanges and calorie counting. She has had a previous visit with a dietitian (has seen dietician in the past). She participates in exercise intermittently. Her home blood glucose trend is increasing steadily. Her breakfast blood glucose range is generally 110-130  mg/dl. Her lunch blood glucose range is generally 90-110 mg/dl. Her dinner blood glucose range is generally >200 mg/dl. Her bedtime blood glucose range is generally 180-200 mg/dl. (She presents today with her meter, no logs, showing overall improved glycemic profile.  Her POCT A1c today is 7.8%, improving from previous visit of 7.9%. She still has not tried the CGM, says she is not in the right head space to do so yet.  She tends to have spike right before dinner meal.  She denies any symptoms of hypoglycemia.  Lowest reading on her meter was 64, highest was 326.) An ACE inhibitor/angiotensin II receptor blocker is not being taken. She does not see a podiatrist.Eye exam is current.  Hyperlipidemia This is a chronic problem. The current episode started more than 1 year ago. The problem is controlled. Recent lipid tests were reviewed and are normal. Exacerbating diseases include diabetes. There are no known factors aggravating her hyperlipidemia. Current antihyperlipidemic treatment includes statins. The current treatment provides moderate improvement of lipids. There are no compliance problems.  Risk factors for coronary artery disease include diabetes mellitus, dyslipidemia and family history.    Review of systems  Constitutional: + increasing body weight- which frustrates her since she has been working out more and continuing to eat right,  current Body mass index is 26.97 kg/m. , no fatigue, no subjective hyperthermia, no subjective hypothermia Eyes: no blurry vision, no xerophthalmia ENT: no sore throat, no nodules palpated in throat, no dysphagia/odynophagia, no hoarseness Cardiovascular: no chest pain, no shortness of breath, no palpitations, no leg swelling Respiratory: no cough, no shortness of breath Gastrointestinal: no nausea/vomiting/diarrhea Musculoskeletal: no muscle/joint aches Skin: no rashes, no hyperemia Neurological: no tremors, no numbness, no tingling, no dizziness Psychiatric:  no depression, no anxiety  Objective:     BP 118/86 (BP Location: Left Arm, Patient Position: Sitting, Cuff Size: Large)   Pulse 83   Ht 5' 5.5" (1.664 m)   Wt 164 lb 9.6 oz (74.7 kg)   BMI 26.97 kg/m   Wt Readings from Last 3 Encounters:  08/20/23 164 lb 9.6 oz (74.7 kg)  05/21/23 162 lb 9.6 oz (73.8 kg)  03/11/23 157 lb (  71.2 kg)     BP Readings from Last 3 Encounters:  08/20/23 118/86  05/21/23 116/74  03/11/23 118/77      Physical Exam- Limited  Constitutional:  Body mass index is 26.97 kg/m. , not in acute distress, normal state of mind Eyes:  EOMI, no exophthalmos Musculoskeletal: no gross deformities, strength intact in all four extremities, no gross restriction of joint movements Skin:  no rashes, no hyperemia Neurological: no tremor with outstretched hands   Diabetic Foot Exam - Simple   No data filed     CMP ( most recent) CMP     Component Value Date/Time   NA 143 07/22/2021 0754   K 4.5 07/22/2021 0754   CL 108 (H) 07/22/2021 0754   CO2 23 07/22/2021 0754   GLUCOSE 175 (H) 07/22/2021 0754   GLUCOSE 185 (H) 11/16/2018 0035   BUN 17 07/22/2021 0754   CREATININE 0.72 07/22/2021 0754   CALCIUM  9.8 07/22/2021 0754   PROT 6.3 07/22/2021 0754   ALBUMIN 4.4 07/22/2021 0754   AST 22 07/22/2021 0754   ALT 21 07/22/2021 0754   ALKPHOS 103 07/22/2021 0754   BILITOT 0.3 07/22/2021 0754   GFRNONAA 105 03/13/2021 0000   GFRAA >60 11/16/2018 0035     Diabetic Labs (most recent): Lab Results  Component Value Date   HGBA1C 7.8 (A) 08/20/2023   HGBA1C 7.9 (A) 05/21/2023   HGBA1C 8.3 (A) 02/12/2023   MICROALBUR 10 mg/L 05/21/2023   MICROALBUR 10 06/05/2022   MICROALBUR 10 05/01/2021     Lipid Panel ( most recent) Lipid Panel     Component Value Date/Time   CHOL 79 (L) 01/17/2019 0736   TRIG 86 08/29/2020 0000   HDL 33 (L) 01/17/2019 0736   CHOLHDL 2.4 01/17/2019 0736   CHOLHDL 3.4 11/15/2018 0808   VLDL 18 11/15/2018 0808   LDLCALC 35  08/29/2020 0000   LDLCALC 33 01/17/2019 0736   LABVLDL 13 01/17/2019 0736      No results found for: "TSH", "FREET4"       Latest Reference Range & Units 08/06/21 14:51 08/06/21 15:42  Hemoglobin A1C 4.0 - 5.6 % 10.4   ZNT8 Antibodies U/mL  <15  Anti GAD 65 Antibodies U/mL  >120 (H)  IA-2 Autoantibodies U/mL  <7.5  Type 1 Diabetes Interpretation   Comment  (H): Data is abnormally high   Latest Reference Range & Units 08/10/23 08:34  C-Peptide 1.1 - 4.4 ng/mL 0.5 (L)  INSULIN  2.6 - 24.9 uIU/mL 2.8  (L): Data is abnormally low  Assessment & Plan:   1) Latent Autoimmune Diabetes in Adults  She presents today with her meter, no logs, showing overall improved glycemic profile.  Her POCT A1c today is 7.8%, improving from previous visit of 7.9%. She still has not tried the CGM, says she is not in the right head space to do so yet.  She tends to have spike right before dinner meal.  She denies any symptoms of hypoglycemia.  Lowest reading on her meter was 64, highest was 326.  - Casey Mason has currently uncontrolled symptomatic LADA (newly discovered) since 57 years of age.  -Recent labs reviewed. Her insulin  levels were borderline low and Cpeptide level was low, indicating her pancreas is not helping to produce insulin  at this time.  - I had a long discussion with her about the progressive nature of diabetes and the pathology behind its complications. -her diabetes is complicated by CAD with MI and she remains at  a high risk for more acute and chronic complications which include CAD, CVA, CKD, retinopathy, and neuropathy. These are all discussed in detail with her.  The following Lifestyle Medicine recommendations according to American College of Lifestyle Medicine [facility]) were discussed and offered to patient and she agrees to start the journey:  A. Whole Foods, Plant-based plate comprising of fruits and vegetables, plant-based proteins, whole-grain carbohydrates was discussed in  detail with the patient.   A list for source of those nutrients were also provided to the patient.  Patient will use only water or unsweetened tea for hydration. B.  The need to stay away from risky substances including alcohol , smoking; obtaining 7 to 9 hours of restorative sleep, at least 150 minutes of moderate intensity exercise weekly, the importance of healthy social connections,  and stress reduction techniques were discussed. C.  A full color page of  Calorie density of various food groups per pound showing examples of each food groups was provided to the patient.  - Nutritional counseling repeated at each appointment due to patients tendency to fall back in to old habits.  - The patient admits there is a room for improvement in their diet and drink choices. -  Suggestion is made for the patient to avoid simple carbohydrates from their diet including Cakes, Sweet Desserts / Pastries, Ice Cream, Soda (diet and regular), Sweet Tea, Candies, Chips, Cookies, Sweet Pastries, Store Bought Juices, Alcohol  in Excess of 1-2 drinks a day, Artificial Sweeteners, Coffee Creamer, and "Sugar-free" Products. This will help patient to have stable blood glucose profile and potentially avoid unintended weight gain.   - I encouraged the patient to switch to unprocessed or minimally processed complex starch and increased protein intake (animal or plant source), fruits, and vegetables.   - Patient is advised to stick to a routine mealtimes to eat 3 meals a day and avoid unnecessary snacks (to snack only to correct hypoglycemia).  - I have approached her with the following individualized plan to manage her diabetes and patient agrees:   -She is advised to continue Lantus  20 units SQ nightly and Humalog  6-12 units TID with meals if glucose is above 90 and she is eating (Specific instructions on how to titrate insulin  dosage based on glucose readings given to patient in writing). I did advise her to stop the  Metformin  today as it may not be helping her manage her diabetes at this point.    -She is encouraged to continue monitoring blood glucose 4 times daily, before meals and before bed, and to call the clinic if she has readings less than 70 or above 200 for 3 tests in a row.  I did ask that she start checking glucose 2 hours after lunch so that we can try to decipher what is happening to her glucose between lunch and supper.  She had CGM in the past but did not like it due to the alarms.   I encouraged her to try it when she is ready.  I did give her some information on different insulin  pumps to look into if she is interested.  - she is not an ideal candidate for incretin therapy due to body habitus or LADA diagnosis.  - Specific targets for  A1c; LDL, HDL, and Triglycerides were discussed with the patient.  2) Blood Pressure /Hypertension:  her blood pressure is controlled to target without the use of antihypertensive medications.    3) Lipids/Hyperlipidemia:    Review of her recent lipid panel from  12/31/21 showed controlled LDL at 33 .  she is advised to continue Lipitor  40 mg daily at bedtime.  Side effects and precautions discussed with her.  4)  Weight/Diet:  her Body mass index is 26.97 kg/m.  -    she is NOT a candidate for weight loss. Exercise, and detailed carbohydrates information provided  -  detailed on discharge instructions.  I did give her a sample of Wegovy to try to see if it helps with hormonal imbalances and halt her steady weight gain.  I did send in script as well to see if it will be covered.  5) Hypercalcemia-resolved: She is known to have several instances of elevated blood calcium  levels.  She also has history of osteoporosis.  She is on calcium  supplement and eats calcium  rich foods routinely.  She may benefit from further work up to rule out parathyroid involvement.  We discussed this today.  I added some additional blood work to assess this further.  Her blood work  shows normal parathyroid function and calcium  levels.  No further work up needed.  6) Chronic Care/Health Maintenance: -she is not on ACEI/ARB and is on Statin medications and is encouraged to initiate and continue to follow up with Ophthalmology, Dentist, Podiatrist at least yearly or according to recommendations, and advised to stay away from smoking (quit a very long time ago). I have recommended yearly flu vaccine and pneumonia vaccine at least every 5 years; moderate intensity exercise for up to 150 minutes weekly; and sleep for at least 7 hours a day.  - she is advised to maintain close follow up with Belvie Boyers, MD for primary care needs, as well as her other providers for optimal and coordinated care.      I spent  43  minutes in the care of the patient today including review of labs from CMP, Lipids, Thyroid Function, Hematology (current and previous including abstractions from other facilities); face-to-face time discussing  her blood glucose readings/logs, discussing hypoglycemia and hyperglycemia episodes and symptoms, medications doses, her options of short and long term treatment based on the latest standards of care / guidelines;  discussion about incorporating lifestyle medicine;  and documenting the encounter. Risk reduction counseling performed per USPSTF guidelines to reduce obesity and cardiovascular risk factors.     Please refer to Patient Instructions for Blood Glucose Monitoring and Insulin /Medications Dosing Guide"  in media tab for additional information. Please  also refer to " Patient Self Inventory" in the Media  tab for reviewed elements of pertinent patient history.  Casey Mason participated in the discussions, expressed understanding, and voiced agreement with the above plans.  All questions were answered to her satisfaction. she is encouraged to contact clinic should she have any questions or concerns prior to her return visit.     Follow up plan: - Return  in about 4 months (around 12/21/2023) for Diabetes F/U with A1c in office, No previsit labs, Bring meter and logs.   Hulon Magic, [facility] [address] [place], [place] [address] Phone: [phone] Fax: [phone]  08/20/2023, 4:32 PM

## 2023-08-25 ENCOUNTER — Telehealth: Payer: Self-pay

## 2023-08-25 NOTE — Telephone Encounter (Signed)
 Pharmacy Patient Advocate Encounter   Received notification from Fax that prior authorization for [employer] is required/requested.   Insurance verification completed.   The patient is insured through [employer] .   Per test claim: PA required; PA submitted to above mentioned insurance via Fax Key/confirmation #/[id number] Status is pending

## 2023-09-03 ENCOUNTER — Encounter: Payer: Self-pay | Admitting: Pulmonary Disease

## 2023-09-08 ENCOUNTER — Encounter: Payer: Self-pay | Admitting: Nurse Practitioner

## 2023-09-09 NOTE — Telephone Encounter (Signed)
 Do we have any update for PA on Wegovy ?

## 2023-09-10 ENCOUNTER — Encounter: Payer: 59 | Attending: Pulmonary Disease | Admitting: *Deleted

## 2023-09-10 VITALS — BP 118/76 | HR 69 | Temp 98.2°F | Resp 16

## 2023-09-10 DIAGNOSIS — M810 Age-related osteoporosis without current pathological fracture: Secondary | ICD-10-CM

## 2023-09-10 MED ORDER — DENOSUMAB 60 MG/ML ~~LOC~~ SOSY
60.0000 mg | PREFILLED_SYRINGE | Freq: Once | SUBCUTANEOUS | Status: AC
  Administered 2023-09-10: 60 mg via SUBCUTANEOUS

## 2023-09-10 NOTE — Progress Notes (Signed)
 Diagnosis: Osteoporosis  Provider:  Dwana Melena MD  Procedure: Injection  Prolia (Denosumab), Dose: 60 mg, Site: subcutaneous, Number of injections: 1  Injection Site(s): Right arm  Post Care: Observation period completed  Discharge: Condition: Good, Destination: Home . AVS Provided  Performed by:  Daleen Squibb, RN

## 2023-09-11 ENCOUNTER — Other Ambulatory Visit (HOSPITAL_COMMUNITY): Payer: Self-pay

## 2023-09-11 ENCOUNTER — Encounter: Payer: Self-pay | Admitting: Pulmonary Disease

## 2023-09-11 NOTE — Telephone Encounter (Signed)
 Pt made aware

## 2023-09-15 ENCOUNTER — Telehealth: Payer: Self-pay

## 2023-09-15 NOTE — Telephone Encounter (Signed)
 Typically we will bump it up as quickly as her body will allow, which is why we do it month to month.  Insurance will pull coverage if she does not see any weight loss benefits with it and we don't want to give them any reason to do that.  I did put in for the first 2 doses in the series and put a refill on each just in case she needed more time on a specific dose (if she were having more nausea, reflux, constipation, diarrhea, etc).

## 2023-09-15 NOTE — Telephone Encounter (Signed)
 Pt stated she needs a 3 month supply of Wegovy . I wanted to confirm you want to keep her on the same dose for 3 months.

## 2023-09-22 ENCOUNTER — Encounter: Payer: Self-pay | Admitting: Nurse Practitioner

## 2023-09-22 ENCOUNTER — Other Ambulatory Visit: Payer: Self-pay | Admitting: Nurse Practitioner

## 2023-09-22 MED ORDER — WEGOVY 0.5 MG/0.5ML ~~LOC~~ SOAJ: 0.5000 mg | SUBCUTANEOUS | 0 refills | Status: DC

## 2023-09-24 ENCOUNTER — Encounter: Payer: Self-pay | Admitting: Cardiology

## 2023-09-24 ENCOUNTER — Other Ambulatory Visit (HOSPITAL_COMMUNITY): Payer: Self-pay

## 2023-09-24 ENCOUNTER — Ambulatory Visit: Payer: Self-pay | Attending: Cardiology | Admitting: Cardiology

## 2023-09-24 ENCOUNTER — Encounter (HOSPITAL_COMMUNITY): Payer: Self-pay | Admitting: *Deleted

## 2023-09-24 ENCOUNTER — Encounter: Payer: Self-pay | Admitting: *Deleted

## 2023-09-24 ENCOUNTER — Encounter: Payer: Self-pay | Admitting: Pulmonary Disease

## 2023-09-24 ENCOUNTER — Other Ambulatory Visit: Payer: Self-pay | Admitting: Cardiology

## 2023-09-24 VITALS — BP 117/74 | HR 77 | Ht 60.0 in | Wt 161.0 lb

## 2023-09-24 DIAGNOSIS — E782 Mixed hyperlipidemia: Secondary | ICD-10-CM | POA: Insufficient documentation

## 2023-09-24 DIAGNOSIS — R0609 Other forms of dyspnea: Secondary | ICD-10-CM | POA: Diagnosis not present

## 2023-09-24 DIAGNOSIS — I25118 Atherosclerotic heart disease of native coronary artery with other forms of angina pectoris: Secondary | ICD-10-CM

## 2023-09-24 DIAGNOSIS — E118 Type 2 diabetes mellitus with unspecified complications: Secondary | ICD-10-CM

## 2023-09-24 DIAGNOSIS — I10 Essential (primary) hypertension: Secondary | ICD-10-CM | POA: Diagnosis not present

## 2023-09-24 MED ORDER — CLOPIDOGREL BISULFATE 75 MG PO TABS
75.0000 mg | ORAL_TABLET | Freq: Every day | ORAL | 3 refills | Status: AC
Start: 2023-09-24 — End: ?

## 2023-09-24 MED ORDER — CLOPIDOGREL BISULFATE 75 MG PO TABS
75.0000 mg | ORAL_TABLET | Freq: Every day | ORAL | 3 refills | Status: DC
  Filled 2023-09-24: qty 90, 90d supply, fill #0

## 2023-09-24 MED ORDER — ATORVASTATIN CALCIUM 40 MG PO TABS: 40.0000 mg | ORAL_TABLET | Freq: Every day | ORAL | 3 refills | Status: AC

## 2023-09-24 MED ORDER — ATORVASTATIN CALCIUM 40 MG PO TABS
40.0000 mg | ORAL_TABLET | Freq: Every day | ORAL | 3 refills | Status: DC
Start: 2023-09-24 — End: 2023-09-24
  Filled 2023-09-24: qty 90, 90d supply, fill #0

## 2023-09-24 NOTE — Patient Instructions (Signed)
 Medication Instructions:   Your physician recommends that you continue on your current medications as directed. Please refer to the Current Medication list given to you today.  *If you need a refill on your cardiac medications before your next appointment, please call your pharmacy*  Testing/Procedures:  Your physician has requested that you have en exercise stress myoview. For further information please visit [email or web address] Please follow instruction sheet, as given.    Follow-Up: At [facility] you and your health needs are our priority.  As part of our continuing mission to provide you with exceptional heart care, our providers are all part of one team.  This team includes your primary Cardiologist (physician) and Advanced Practice Providers or APPs (Physician Assistants and Nurse Practitioners) who all work together to provide you with the care you need, when you need it.  Your next appointment:   1 year(s)  Provider:   DR. PATWARDHAN

## 2023-09-24 NOTE — Progress Notes (Signed)
 Cardiology Office Note:  .   Date:  09/24/2023  ID:  Casey Mason, DOB May 21, 1965, MRN [id number] PCP: Kayla Drivers, MD  [facility] Providers Cardiologist:  Newman Lawrence, MD PCP: Kayla Drivers, MD  Chief Complaint  Patient presents with   Coronary Artery Disease     Casey Mason is a 58 y.o. female with hypertension, hyperlipidemia, type II DM, CAD, former smoker  History of Present Illness  Patient was previously followed by Dr. Claudene and then Dr. Hobart.  This is my first visit with her.  Patient had anterior STEMI in 2020, treated with primary PCI to LAD.   Patient works at [employer] for [employer].  She walks at work without any significant chest pain, but does have shortness of breath when the humidity is high.  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  She is compliant with her medical therapy A1c was 7.8% in 07/2023.  She is seeing endocrinologist Dr. Therisa regularly.     Vitals:   09/24/23 0838  BP: 117/74  Pulse: 77  SpO2: 96%      Review of Systems  Cardiovascular:  Negative for chest pain, dyspnea on exertion, leg swelling, palpitations and syncope.        Studies Reviewed: SABRA        EKG 09/24/2023: Normal sinus rhythm Normal ECG When compared with ECG of 29-Sep-2022 07:52, No significant change was found     Labs 07/2023: HbA1C 7.8% Cr 0.6  01/2023: Hb 13.9 TSH 2.0  12/2021: Chol 106, TG 67, HDL 59, LDL 33  Coronary intervention 2020: Stuttering anterior septal myocardial infarction over the past 12 hours 99% proximal LAD with TIMI grade II flow treated with PCI using 2 seven 5 x 22 Onyx drug-eluting stent and postdilated to 3.0 mm in diameter with resultant TIMI grade III flow.  0% stenosis. Normal left main. First diagonal which arises proximal to the LAD culprit site contains diffuse 60% ostial to proximal stenosis. Normal circumflex Normal RCA,  dominant.  Echocardiogram 2022:  1. Left ventricular ejection fraction, by estimation, is 60 to 65%. The  left ventricle has normal function. The left ventricle has no regional  wall motion abnormalities. Left ventricular diastolic parameters were  normal.   2. Right ventricular systolic function is normal. The right ventricular  size is normal. There is normal pulmonary artery systolic pressure. The  estimated right ventricular systolic pressure is 26.2 mmHg.   3. The mitral valve is normal in structure. Mild mitral valve  regurgitation. No evidence of mitral stenosis.   4. The aortic valve is normal in structure. Aortic valve regurgitation is  not visualized. No aortic stenosis is present.   5. The inferior vena cava is normal in size with greater than 50%  respiratory variability, suggesting right atrial pressure of 3 mmHg.   Long-term monitor 2022: Normal sinus rhythm Rare PAC's and PVC's No sustained arrhythmia. No correlation of arrhythmia and symptoms    Physical Exam Vitals and nursing note reviewed.  Constitutional:      General: She is not in acute distress. Neck:     Vascular: No JVD.   Cardiovascular:     Rate and Rhythm: Normal rate and regular rhythm.     Heart sounds: Normal heart sounds. No murmur heard. Pulmonary:     Effort: Pulmonary effort is normal.     Breath sounds: Normal breath sounds. No wheezing or rales.  Musculoskeletal:     Right lower leg: No edema.     Left lower leg: No edema.      VISIT DIAGNOSES:   ICD-10-CM   1. Coronary artery disease involving native coronary artery of native heart with other form of angina pectoris (HCC)  I25.118 EKG 12-Lead    clopidogrel  (PLAVIX ) 75 MG tablet    atorvastatin  (LIPITOR ) 40 MG tablet    MYOCARDIAL PERFUSION IMAGING    Cardiac Stress Test: Informed Consent Details: Physician/Practitioner Attestation; Transcribe to consent form and obtain patient signature    2. Exertional dyspnea  R06.09  Cardiac Stress Test: Informed Consent Details: Physician/Practitioner Attestation; Transcribe to consent form and obtain patient signature    3. Mixed hyperlipidemia  E78.2     4. Essential hypertension  I10     5. Type 2 diabetes mellitus with complication, without long-term current use of insulin  [facility])  E11.8        Casey Mason is a 58 y.o. female with hypertension, hyperlipidemia, type II DM, CAD, former smoker Assessment & Plan  CAD: Anterior STEMI 2020, PCI to proximal LAD.  EF 60 to 65% in 2022. Continue Plavix  monotherapy, tolerating well without any bleeding issues. Lipids are well-controlled on Lipitor  40 mg daily, continue the same. Continue regular follow-up with endocrinologist Dr. Benton Rio, A1c goal <7%.   Given her mild exertional dyspnea and known CAD with no ischemia testing in 5 years, I will proceed with exercise nuclear  stress test.  Hypertension: Controlled  Type II DM: A1c 7.8%.  Continue follow-up with endocrinology, goal <7%. Continue metformin , insulin , defer adjustment to endocrinology.  Informed Consent   Shared Decision Making/Informed Consent The risks [chest pain, shortness of breath, cardiac arrhythmias, dizziness, blood pressure fluctuations, myocardial infarction, stroke/transient ischemic attack, nausea, vomiting, allergic reaction, radiation exposure, metallic taste sensation and life-threatening complications (estimated to be 1 in 10,000)], benefits (risk stratification, diagnosing coronary artery disease, treatment guidance) and alternatives of a nuclear stress test were discussed in detail with Ms. Rials and she agrees to proceed.       Meds ordered this encounter  Medications   clopidogrel  (PLAVIX ) 75 MG tablet    Sig: Take 1 tablet (75 mg total) by mouth daily.    Dispense:  90 tablet    Refill:  3   atorvastatin  (LIPITOR ) 40 MG tablet    Sig: Take 1 tablet (40 mg total) by mouth daily.    Dispense:  90 tablet    Refill:  3     F/u in 1 year.  At that time, if things are stable, we could see her as needed.  Signed, Newman JINNY Lawrence, MD

## 2023-10-01 ENCOUNTER — Ambulatory Visit: Payer: Self-pay | Admitting: Cardiology

## 2023-10-01 ENCOUNTER — Ambulatory Visit (HOSPITAL_COMMUNITY)
Admission: RE | Admit: 2023-10-01 | Discharge: 2023-10-01 | Disposition: A | Discharge status: Home / Self Care | Source: Ambulatory Visit | Attending: Cardiology | Admitting: Cardiology

## 2023-10-01 DIAGNOSIS — I25118 Atherosclerotic heart disease of native coronary artery with other forms of angina pectoris: Secondary | ICD-10-CM | POA: Insufficient documentation

## 2023-10-01 LAB — MYOCARDIAL PERFUSION IMAGING
Angina Index: 0
Duke Treadmill Score: 9
Estimated workload: 10.1
Exercise duration (min): 9 min
Exercise duration (sec): 0 s
LV dias vol: 86 mL (ref 46–106)
LV sys vol: 28 mL (ref 3.8–5.2)
MPHR: 161 {beats}/min
Nuc Stress EF: 67 %
Peak HR: 150 {beats}/min
Percent HR: 93 %
RPE: 17
Rest HR: 67 {beats}/min
Rest Nuclear Isotope Dose: 10.8 mCi
SDS: 0
SRS: 0
SSS: 0
ST Depression (mm): 0 mm
Stress Nuclear Isotope Dose: 32 mCi
TID: 0.82

## 2023-10-01 MED ORDER — TECHNETIUM TC 99M TETROFOSMIN IV KIT
10.8000 | PACK | Freq: Once | INTRAVENOUS | Status: AC | PRN
  Administered 2023-10-01: 10.8 via INTRAVENOUS

## 2023-10-01 MED ORDER — TECHNETIUM TC 99M TETROFOSMIN IV KIT
32.0000 | PACK | Freq: Once | INTRAVENOUS | Status: AC | PRN
  Administered 2023-10-01: 32 via INTRAVENOUS

## 2023-10-19 ENCOUNTER — Other Ambulatory Visit: Payer: Self-pay | Admitting: Nurse Practitioner

## 2023-10-19 MED ORDER — WEGOVY 1 MG/0.5ML ~~LOC~~ SOAJ: 1.0000 mg | SUBCUTANEOUS | 3 refills | Status: DC

## 2023-10-19 NOTE — Addendum Note (Signed)
 Addended by: Jonatan Wilsey J on: 10/19/2023 02:17 PM   Modules accepted: Orders

## 2023-11-03 ENCOUNTER — Other Ambulatory Visit: Payer: Self-pay | Admitting: Nurse Practitioner

## 2023-12-03 ENCOUNTER — Encounter: Payer: Self-pay | Admitting: Nurse Practitioner

## 2023-12-03 MED ORDER — SEMGLEE (YFGN) 100 UNIT/ML ~~LOC~~ SOPN: 20.0000 [IU] | PEN_INJECTOR | Freq: Every day | SUBCUTANEOUS | 0 refills | Status: DC

## 2023-12-27 ENCOUNTER — Encounter: Payer: Self-pay | Admitting: Cardiology

## 2023-12-28 ENCOUNTER — Encounter: Payer: Self-pay | Admitting: Nurse Practitioner

## 2023-12-28 ENCOUNTER — Ambulatory Visit: Admitting: Nurse Practitioner

## 2023-12-28 VITALS — BP 112/69 | HR 60 | Ht 60.0 in | Wt 158.0 lb

## 2023-12-28 DIAGNOSIS — E782 Mixed hyperlipidemia: Secondary | ICD-10-CM | POA: Diagnosis not present

## 2023-12-28 DIAGNOSIS — Z794 Long term (current) use of insulin: Secondary | ICD-10-CM | POA: Diagnosis not present

## 2023-12-28 DIAGNOSIS — E139 Other specified diabetes mellitus without complications: Secondary | ICD-10-CM | POA: Diagnosis not present

## 2023-12-28 LAB — POCT GLYCOSYLATED HEMOGLOBIN (HGB A1C): Hemoglobin A1C: 7.2 % — AB (ref 4.0–5.6)

## 2023-12-28 MED ORDER — LANTUS SOLOSTAR 100 UNIT/ML ~~LOC~~ SOPN: 20.0000 [IU] | PEN_INJECTOR | Freq: Every day | SUBCUTANEOUS | 1 refills | Status: AC

## 2023-12-28 MED ORDER — WEGOVY 1.7 MG/0.75ML ~~LOC~~ SOAJ: 1.7000 mg | SUBCUTANEOUS | 3 refills | Status: DC

## 2023-12-28 NOTE — Progress Notes (Signed)
 Endocrinology Follow Up Note       12/28/2023, 4:16 PM    Subjective:    Patient ID: Casey Mason, female    DOB: 09/16/1965.  Casey Mason is being seen in follow up after being seen in consultation for management of currently uncontrolled symptomatic diabetes requested by  Skillman, Katherine E, PA-C.   Past Medical History:  Diagnosis Date   Diabetes mellitus without complication (HCC)    Diverticulosis of colon    Family history of adverse reaction to anesthesia    mother-- ponv   History of colon polyps    2016- hyperplastic   History of diverticulitis of colon    Nephrolithiasis    right   PONV (postoperative nausea and vomiting)    Prolapse of female bladder, acquired    Right ureteral stone     Past Surgical History:  Procedure Laterality Date   BLADDER SUSPENSION     CESAREAN SECTION  1988   COLONOSCOPY  11-10-2014  in [place]   CORONARY STENT INTERVENTION N/A 11/14/2018   Procedure: CORONARY STENT INTERVENTION;  Surgeon: Claudene Victory ORN, MD;  Location: [facility];  Service: Cardiovascular;  Laterality: N/A;   CORONARY/GRAFT ACUTE MI REVASCULARIZATION N/A 11/14/2018   Procedure: Coronary/Graft Acute MI Revascularization;  Surgeon: Claudene Victory ORN, MD;  Location: [facility];  Service: Cardiovascular;  Laterality: N/A;   CYSTOSCOPY WITH RETROGRADE PYELOGRAM, URETEROSCOPY AND STENT PLACEMENT Right 02/07/2016   Procedure: CYSTOSCOPY WITH RIGHT PYLEOGRAM  RIGHT URETEROSCOPY, , DIALATION OF RIGHT URETER STRICUTURE FLEXIBLE RIGHT URETEROSCOPY, RIGHT PYELOSCOPY,  AND RIGHT DOUBLE STENT PLACEMENT WITH TETHER;  Surgeon: Arlena Gal, MD;  Location: [facility];  Service: Urology;  Laterality: Right;   EXTRACORPOREAL SHOCK WAVE LITHOTRIPSY Right 09/20/2015   LEFT HEART CATH AND CORONARY ANGIOGRAPHY N/A 11/14/2018   Procedure: LEFT HEART CATH AND CORONARY ANGIOGRAPHY;  Surgeon:  Claudene Victory ORN, MD;  Location: [facility];  Service: Cardiovascular;  Laterality: N/A;   ROTATOR CUFF REPAIR Right 2013 or 2014??   TONSILLECTOMY  2003   TUBAL LIGATION  1992   VAGINAL HYSTERECTOMY  2005    Social History   Socioeconomic History   Marital status: Married    Spouse name: Josefa   Number of children: 2   Years of education: 14   Highest education level: Associate degree: occupational, Scientist, product/process development, or vocational program  Occupational History   Not on file  Tobacco Use   Smoking status: Former    Current packs/day: 0.00    Average packs/day: 0.5 packs/day for 15.0 years (7.5 ttl pk-yrs)    Types: Cigarettes    Start date: 11/14/2003    Quit date: 11/14/2018    Years since quitting: 5.1   Smokeless tobacco: Never  Vaping Use   Vaping status: Never Used  Substance and Sexual Activity   Alcohol  use: Yes    Alcohol /week: 0.0 standard drinks of alcohol     Comment: occasional   Drug use: No   Sexual activity: Not on file  Other Topics Concern   Not on file  Social History Narrative   Not on file   Social Drivers of Health  Financial Resource Strain: Low Risk  (12/21/2018)   Overall Financial Resource Strain (CARDIA)    Difficulty of Paying Living Expenses: Not hard at all  Food Insecurity: No Food Insecurity (12/21/2018)   Hunger Vital Sign    Worried About Running Out of Food in the Last Year: Never true    Ran Out of Food in the Last Year: Never true  Transportation Needs: No Transportation Needs (12/21/2018)   PRAPARE - Administrator, Civil Service (Medical): No    Lack of Transportation (Non-Medical): No  Physical Activity: Sufficiently Active (12/21/2018)   Exercise Vital Sign    Days of Exercise per Week: 6 days    Minutes of Exercise per Session: 30 min  Stress: Stress Concern Present (12/21/2018)   [employer] of Occupational Health - Occupational Stress Questionnaire    Feeling of Stress : To some extent  Social Connections:  Unknown (08/05/2021)   [facility] from [facility]   Social Network    Social Network: Not on file    Family History  Problem Relation Age of Onset   Anemia Mother    Emphysema Mother    Osteoporosis Mother    Stomach cancer Neg Hx    Esophageal cancer Neg Hx     Outpatient Encounter Medications as of 12/28/2023  Medication Sig   atorvastatin  (LIPITOR ) 40 MG tablet Take 1 tablet (40 mg total) by mouth daily.   blood glucose meter kit and supplies KIT 1 each by Does not apply route 4 (four) times daily. Dispense based on patient and insurance preference. Use up to four times daily as directed.   Blood Glucose Monitoring Suppl (ACCU-CHEK GUIDE ME) w/Device KIT USE TO CHECK GLUCOSE 4 TIMES A DAY   Cholecalciferol (VITAMIN D3) 2000 units TABS Take 2 tablets by mouth daily.    clopidogrel  (PLAVIX ) 75 MG tablet Take 1 tablet (75 mg total) by mouth daily.   Continuous Glucose Sensor (DEXCOM G7 SENSOR) MISC Inject 1 Application into the skin as directed. Change sensor every 10 days as directed.   EMBECTA PEN NEEDLE NANO 2 GEN 32G X 4 MM MISC USE TO INJECT INSULIN  FOUR TIMES A DAY   furosemide  (LASIX ) 20 MG tablet Take 1 tablet (20 mg total) by mouth as needed. Once daily as needed for edema   glucose blood (ONETOUCH VERIO) test strip Use to check glucose 4 times daily   insulin  lispro (HUMALOG  KWIKPEN) 100 UNIT/ML KwikPen Inject 6-12 Units into the skin 3 (three) times daily.   LANTUS  SOLOSTAR 100 UNIT/ML Solostar Pen Inject 20 Units into the skin daily.   Multiple Vitamin (MULTIVITAMIN) tablet Take 1 tablet by mouth daily.   mupirocin ointment (BACTROBAN) 2 % SMARTSIG:1 Application Topical 2-3 Times Daily   nitroGLYCERIN  (NITROSTAT ) 0.4 MG SL tablet Place 1 tablet (0.4 mg total) under the tongue every 5 (five) minutes x 3 doses as needed for chest pain.   OneTouch Delica Lancets 30G MISC USE TO CHECK BLOOD GLUCOSE FOUR TIMES DAILY   semaglutide -weight management (WEGOVY ) 1.7 MG/0.75ML SOAJ SQ  injection Inject 1.7 mg into the skin once a week.   vitamin B-12 (CYANOCOBALAMIN) 100 MCG tablet Take 100 mcg by mouth daily.   [DISCONTINUED] Semaglutide -Weight Management (WEGOVY ) 1 MG/0.5ML SOAJ Inject 1 mg into the skin once a week.   [DISCONTINUED] SEMGLEE , YFGN, 100 UNIT/ML Pen Inject 20 Units into the skin at bedtime.   [DISCONTINUED] metFORMIN  (GLUCOPHAGE -XR) 500 MG 24 hr tablet Take 1 tablet (500 mg total) by  mouth 2 (two) times daily with a meal. (Patient not taking: Reported on 12/28/2023)   No facility-administered encounter medications on file as of 12/28/2023.    ALLERGIES: Allergies  Allergen Reactions   Oxycodone  Itching   Sulfa Antibiotics Itching   Sulfur     Other reaction(s): Unknown    VACCINATION STATUS: Immunization History  Administered Date(s) Administered   Hepatitis A, Adult 03/17/2018   PFIZER(Purple Top)SARS-COV-2 Vaccination 12/29/2019   Typhoid Live 03/17/2018    Diabetes She presents for her follow-up diabetic visit. She has type 1 (recently reclassified as LADA) diabetes mellitus. Onset time: diagnosed at approx age of 33. Her disease course has been improving. There are no hypoglycemic associated symptoms. There are no diabetic associated symptoms. There are no hypoglycemic complications. Symptoms are stable. Diabetic complications include heart disease. Risk factors for coronary artery disease include diabetes mellitus, dyslipidemia, family history, hypertension and tobacco exposure. Current diabetic treatment includes intensive insulin  program and oral agent (monotherapy). She is compliant with treatment most of the time. Her weight is increasing steadily. She is following a generally healthy diet. Meal planning includes avoidance of concentrated sweets, ADA exchanges and calorie counting. She has had a previous visit with a dietitian (has seen dietician in the past). She participates in exercise intermittently. Her home blood glucose trend is decreasing  steadily. Her breakfast blood glucose range is generally >200 mg/dl. Her lunch blood glucose range is generally 90-110 mg/dl. Her dinner blood glucose range is generally >200 mg/dl. Her bedtime blood glucose range is generally 180-200 mg/dl. (She presents today with her meter, no logs, showing overall improved glycemic profile.  Her POCT A1c today is 7.2%, improving from previous visit of 7.8%. She still has not tried the CGM, says she is not in the right head space to do so yet but she is getting closer (almost brought it with her today but forgot).  She does feel that the Lantus  worked better for her than the Semglee  or Rezvoglar that they have substituted with.  She does wake up from time to time with lower sugar readings at night.) An ACE inhibitor/angiotensin II receptor blocker is not being taken. She does not see a podiatrist.Eye exam is current.  Hyperlipidemia This is a chronic problem. The current episode started more than 1 year ago. The problem is controlled. Recent lipid tests were reviewed and are normal. Exacerbating diseases include diabetes. There are no known factors aggravating her hyperlipidemia. Current antihyperlipidemic treatment includes statins. The current treatment provides moderate improvement of lipids. There are no compliance problems.  Risk factors for coronary artery disease include diabetes mellitus, dyslipidemia and family history.    Review of systems  Constitutional: + Minimally fluctuating body weight,  current Body mass index is 30.86 kg/m. , no fatigue, no subjective hyperthermia, no subjective hypothermia Eyes: no blurry vision, no xerophthalmia ENT: no sore throat, no nodules palpated in throat, no dysphagia/odynophagia, no hoarseness Cardiovascular: no chest pain, no shortness of breath, no palpitations, no leg swelling Respiratory: no cough, no shortness of breath Gastrointestinal: no nausea/vomiting/diarrhea Musculoskeletal: no muscle/joint aches Skin: no  rashes, no hyperemia Neurological: no tremors, no numbness, no tingling, no dizziness Psychiatric: no depression, no anxiety  Objective:     BP 112/69 (BP Location: Left Arm, Patient Position: Sitting, Cuff Size: Large)   Pulse 60   Ht 5' (1.524 m)   Wt 158 lb (71.7 kg)   BMI 30.86 kg/m   Wt Readings from Last 3 Encounters:  12/28/23 158 lb (71.7  kg)  10/01/23 161 lb (73 kg)  09/24/23 161 lb (73 kg)     BP Readings from Last 3 Encounters:  12/28/23 112/69  09/24/23 117/74  09/10/23 118/76      Physical Exam- Limited  Constitutional:  Body mass index is 30.86 kg/m. , not in acute distress, normal state of mind Eyes:  EOMI, no exophthalmos Musculoskeletal: no gross deformities, strength intact in all four extremities, no gross restriction of joint movements Skin:  no rashes, no hyperemia Neurological: no tremor with outstretched hands   Diabetic Foot Exam - Simple   Simple Foot Form Visual Inspection No deformities, no ulcerations, no other skin breakdown bilaterally: Yes Sensation Testing Intact to touch and monofilament testing bilaterally: Yes Pulse Check Posterior Tibialis and Dorsalis pulse intact bilaterally: Yes Comments     CMP ( most recent) CMP     Component Value Date/Time   NA 143 07/22/2021 0754   K 4.5 07/22/2021 0754   CL 108 (H) 07/22/2021 0754   CO2 23 07/22/2021 0754   GLUCOSE 175 (H) 07/22/2021 0754   GLUCOSE 185 (H) 11/16/2018 0035   BUN 17 07/22/2021 0754   CREATININE 0.72 07/22/2021 0754   CALCIUM  9.8 07/22/2021 0754   PROT 6.3 07/22/2021 0754   ALBUMIN 4.4 07/22/2021 0754   AST 22 07/22/2021 0754   ALT 21 07/22/2021 0754   ALKPHOS 103 07/22/2021 0754   BILITOT 0.3 07/22/2021 0754   GFRNONAA 105 03/13/2021 0000   GFRAA >60 11/16/2018 0035     Diabetic Labs (most recent): Lab Results  Component Value Date   HGBA1C 7.2 (A) 12/28/2023   HGBA1C 7.8 (A) 08/20/2023   HGBA1C 7.9 (A) 05/21/2023   MICROALBUR 10 mg/L 05/21/2023    MICROALBUR 10 06/05/2022   MICROALBUR 10 05/01/2021     Lipid Panel ( most recent) Lipid Panel     Component Value Date/Time   CHOL 79 (L) 01/17/2019 0736   TRIG 86 08/29/2020 0000   HDL 33 (L) 01/17/2019 0736   CHOLHDL 2.4 01/17/2019 0736   CHOLHDL 3.4 11/15/2018 0808   VLDL 18 11/15/2018 0808   LDLCALC 35 08/29/2020 0000   LDLCALC 33 01/17/2019 0736   LABVLDL 13 01/17/2019 0736      No results found for: TSH, FREET4       Latest Reference Range & Units 08/06/21 14:51 08/06/21 15:42  Hemoglobin A1C 4.0 - 5.6 % 10.4   ZNT8 Antibodies U/mL  <15  Anti GAD 65 Antibodies U/mL  >120 (H)  IA-2 Autoantibodies U/mL  <7.5  Type 1 Diabetes Interpretation   Comment  (H): Data is abnormally high   Latest Reference Range & Units 08/10/23 08:34  C-Peptide 1.1 - 4.4 ng/mL 0.5 (L)  INSULIN  2.6 - 24.9 uIU/mL 2.8  (L): Data is abnormally low  Assessment & Plan:   1) Latent Autoimmune Diabetes in Adults  She presents today with her meter, no logs, showing overall improved glycemic profile.  Her POCT A1c today is 7.2%, improving from previous visit of 7.8%. She still has not tried the CGM, says she is not in the right head space to do so yet but she is getting closer (almost brought it with her today but forgot).  She does feel that the Lantus  worked better for her than the Semglee  or Rezvoglar that they have substituted with.  She does wake up from time to time with lower sugar readings at night.  Casey Mason has currently uncontrolled symptomatic LADA (newly discovered)  since 58 years of age.  -Recent labs reviewed. Her insulin  levels were borderline low and Cpeptide level was low, indicating her pancreas is not helping to produce insulin  at this time.  - I had a long discussion with her about the progressive nature of diabetes and the pathology behind its complications. -her diabetes is complicated by CAD with MI and she remains at a high risk for more acute and chronic  complications which include CAD, CVA, CKD, retinopathy, and neuropathy. These are all discussed in detail with her.  The following Lifestyle Medicine recommendations according to American College of Lifestyle Medicine [facility]) were discussed and offered to patient and she agrees to start the journey:  A. Whole Foods, Plant-based plate comprising of fruits and vegetables, plant-based proteins, whole-grain carbohydrates was discussed in detail with the patient.   A list for source of those nutrients were also provided to the patient.  Patient will use only water or unsweetened tea for hydration. B.  The need to stay away from risky substances including alcohol , smoking; obtaining 7 to 9 hours of restorative sleep, at least 150 minutes of moderate intensity exercise weekly, the importance of healthy social connections,  and stress reduction techniques were discussed. C.  A full color page of  Calorie density of various food groups per pound showing examples of each food groups was provided to the patient.  - Nutritional counseling repeated at each appointment due to patients tendency to fall back in to old habits.  - The patient admits there is a room for improvement in their diet and drink choices. -  Suggestion is made for the patient to avoid simple carbohydrates from their diet including Cakes, Sweet Desserts / Pastries, Ice Cream, Soda (diet and regular), Sweet Tea, Candies, Chips, Cookies, Sweet Pastries, Store Bought Juices, Alcohol  in Excess of 1-2 drinks a day, Artificial Sweeteners, Coffee Creamer, and Sugar-free Products. This will help patient to have stable blood glucose profile and potentially avoid unintended weight gain.   - I encouraged the patient to switch to unprocessed or minimally processed complex starch and increased protein intake (animal or plant source), fruits, and vegetables.   - Patient is advised to stick to a routine mealtimes to eat 3 meals a day and avoid unnecessary  snacks (to snack only to correct hypoglycemia).  - I have approached her with the following individualized plan to manage her diabetes and patient agrees:   -She is advised to continue Lantus  20 units SQ nightly (sent in for brand only as it worked better for her) and Humalog  6-12 units TID with meals if glucose is above 90 and she is eating (Specific instructions on how to titrate insulin  dosage based on glucose readings given to patient in writing).  Suspect she may be experiencing liver dumping vs dawn phenomenon due to higher morning readings than the night before.  -She is encouraged to continue monitoring blood glucose 4 times daily, before meals and before bed, and to call the clinic if she has readings less than 70 or above 200 for 3 tests in a row.  I did ask that she start checking glucose 2 hours after lunch so that we can try to decipher what is happening to her glucose between lunch and supper.  She had CGM in the past but did not like it due to the alarms.   I encouraged her to try it when she is ready.  I did give her some information on different insulin  pumps to look into  if she is interested.  - she is not an ideal candidate for incretin therapy due to body habitus or LADA diagnosis.  - Specific targets for  A1c; LDL, HDL, and Triglycerides were discussed with the patient.  2) Blood Pressure /Hypertension:  her blood pressure is controlled to target without the use of antihypertensive medications.    3) Lipids/Hyperlipidemia:    Review of her recent lipid panel from 12/31/21 showed controlled LDL at 33 .  she is advised to continue Lipitor  40 mg daily at bedtime.  Side effects and precautions discussed with her.  4)  Weight/Diet:  her Body mass index is 30.86 kg/m.  -    she is NOT a candidate for weight loss. Exercise, and detailed carbohydrates information provided  -  detailed on discharge instructions.  I did give her a sample of Wegovy  to try to see if it helps with  hormonal imbalances and halt her steady weight gain.  I did send in script as well to see if it will be covered.  She has tolerated it well, now on the 1 mg dose.  Will increase to 1.7 mg SQ weekly.  5) Hypercalcemia-resolved: She is known to have several instances of elevated blood calcium  levels.  She also has history of osteoporosis.  She is on calcium  supplement and eats calcium  rich foods routinely.  She may benefit from further work up to rule out parathyroid involvement.  We discussed this today.  I added some additional blood work to assess this further.  Her blood work shows normal parathyroid function and calcium  levels.  No further work up needed.  6) Chronic Care/Health Maintenance: -she is not on ACEI/ARB and is on Statin medications and is encouraged to initiate and continue to follow up with Ophthalmology, Dentist, Podiatrist at least yearly or according to recommendations, and advised to stay away from smoking (quit a very long time ago). I have recommended yearly flu vaccine and pneumonia vaccine at least every 5 years; moderate intensity exercise for up to 150 minutes weekly; and sleep for at least 7 hours a day.  - she is advised to maintain close follow up with Skillman, Katherine E, PA-C for primary care needs, as well as her other providers for optimal and coordinated care.      I spent  41  minutes in the care of the patient today including review of labs from CMP, Lipids, Thyroid Function, Hematology (current and previous including abstractions from other facilities); face-to-face time discussing  her blood glucose readings/logs, discussing hypoglycemia and hyperglycemia episodes and symptoms, medications doses, her options of short and long term treatment based on the latest standards of care / guidelines;  discussion about incorporating lifestyle medicine;  and documenting the encounter. Risk reduction counseling performed per USPSTF guidelines to reduce obesity and  cardiovascular risk factors.     Please refer to Patient Instructions for Blood Glucose Monitoring and Insulin /Medications Dosing Guide  in media tab for additional information. Please  also refer to  Patient Self Inventory in the Media  tab for reviewed elements of pertinent patient history.  Casey Mason participated in the discussions, expressed understanding, and voiced agreement with the above plans.  All questions were answered to her satisfaction. she is encouraged to contact clinic should she have any questions or concerns prior to her return visit.     Follow up plan: - Return in about 4 months (around 04/28/2024) for Diabetes F/U with A1c in office, Bring meter and logs, No  previsit labs.  Benton Rio, [facility] [address] [place], [place] [address] Phone: [phone] Fax: [phone]  12/28/2023, 4:16 PM

## 2023-12-29 ENCOUNTER — Encounter: Payer: Self-pay | Admitting: Nurse Practitioner

## 2023-12-29 MED ORDER — WEGOVY 1.7 MG/0.75ML ~~LOC~~ SOAJ: 1.7000 mg | SUBCUTANEOUS | 3 refills | Status: DC

## 2024-01-07 DIAGNOSIS — Z1231 Encounter for screening mammogram for malignant neoplasm of breast: Secondary | ICD-10-CM | POA: Diagnosis not present

## 2024-01-27 ENCOUNTER — Other Ambulatory Visit: Payer: Self-pay | Admitting: Nurse Practitioner

## 2024-01-27 MED ORDER — TRUE METRIX METER W/DEVICE KIT: PACK | 0 refills | Status: AC

## 2024-01-27 MED ORDER — TRUE METRIX BLOOD GLUCOSE TEST VI STRP: ORAL_STRIP | 12 refills | Status: AC

## 2024-02-10 DIAGNOSIS — Z01419 Encounter for gynecological examination (general) (routine) without abnormal findings: Secondary | ICD-10-CM | POA: Diagnosis not present

## 2024-02-10 DIAGNOSIS — Z6825 Body mass index (BMI) 25.0-25.9, adult: Secondary | ICD-10-CM | POA: Diagnosis not present

## 2024-02-12 DIAGNOSIS — R739 Hyperglycemia, unspecified: Secondary | ICD-10-CM | POA: Diagnosis not present

## 2024-02-12 DIAGNOSIS — Z1321 Encounter for screening for nutritional disorder: Secondary | ICD-10-CM | POA: Diagnosis not present

## 2024-02-12 DIAGNOSIS — E1165 Type 2 diabetes mellitus with hyperglycemia: Secondary | ICD-10-CM | POA: Diagnosis not present

## 2024-02-12 DIAGNOSIS — R5383 Other fatigue: Secondary | ICD-10-CM | POA: Diagnosis not present

## 2024-02-12 DIAGNOSIS — Z13220 Encounter for screening for lipoid disorders: Secondary | ICD-10-CM | POA: Diagnosis not present

## 2024-02-12 LAB — LAB REPORT - SCANNED
A1c: 7
EGFR: 102.2

## 2024-02-22 DIAGNOSIS — I2510 Atherosclerotic heart disease of native coronary artery without angina pectoris: Secondary | ICD-10-CM | POA: Diagnosis not present

## 2024-02-22 DIAGNOSIS — E1165 Type 2 diabetes mellitus with hyperglycemia: Secondary | ICD-10-CM | POA: Diagnosis not present

## 2024-02-22 DIAGNOSIS — N200 Calculus of kidney: Secondary | ICD-10-CM | POA: Diagnosis not present

## 2024-02-22 DIAGNOSIS — Z23 Encounter for immunization: Secondary | ICD-10-CM | POA: Diagnosis not present

## 2024-02-22 DIAGNOSIS — K219 Gastro-esophageal reflux disease without esophagitis: Secondary | ICD-10-CM | POA: Diagnosis not present

## 2024-02-22 DIAGNOSIS — Z6825 Body mass index (BMI) 25.0-25.9, adult: Secondary | ICD-10-CM | POA: Diagnosis not present

## 2024-02-29 ENCOUNTER — Other Ambulatory Visit: Payer: Self-pay | Admitting: Nurse Practitioner

## 2024-03-02 ENCOUNTER — Telehealth: Payer: Self-pay | Admitting: Pharmacy Technician

## 2024-03-02 NOTE — Telephone Encounter (Signed)
 Patient is receiving co-pay assistance  Medication: [employer] Program: [employer] Support Plus Co-Pay Program Approval Dates: Approved from [employer] until 01/28/26 ID: [id number] Award Amount: $ 1500

## 2024-03-14 ENCOUNTER — Ambulatory Visit: Payer: Self-pay

## 2024-03-14 VITALS — BP 134/84 | HR 80 | Temp 97.9°F | Resp 16

## 2024-03-14 DIAGNOSIS — M810 Age-related osteoporosis without current pathological fracture: Secondary | ICD-10-CM | POA: Diagnosis not present

## 2024-03-14 MED ORDER — DENOSUMAB 60 MG/ML ~~LOC~~ SOSY
60.0000 mg | PREFILLED_SYRINGE | Freq: Once | SUBCUTANEOUS | Status: AC
  Administered 2024-03-14: 08:00:00 60 mg via SUBCUTANEOUS

## 2024-03-14 NOTE — Progress Notes (Signed)
 Diagnosis: Osteoporosis  Provider:  Mannam, Praveen MD  Procedure: Injection  Prolia  (Denosumab ), Dose: 60 mg, Site: subcutaneous, Number of injections: 1  Injection Site(s): Right arm  Post Care: Observation period completed  Discharge: Condition: Good, Destination: Home . AVS Provided  Performed by:  Baldwin Darice Helling, RN

## 2024-03-17 DIAGNOSIS — E109 Type 1 diabetes mellitus without complications: Secondary | ICD-10-CM | POA: Diagnosis not present

## 2024-04-13 ENCOUNTER — Other Ambulatory Visit: Payer: Self-pay | Admitting: Nurse Practitioner

## 2024-04-19 ENCOUNTER — Encounter: Payer: Self-pay | Admitting: Nurse Practitioner

## 2024-04-20 MED ORDER — SEMAGLUTIDE (2 MG/DOSE) 8 MG/3ML ~~LOC~~ SOPN: 2.0000 mg | PEN_INJECTOR | SUBCUTANEOUS | 1 refills | Status: DC

## 2024-04-28 ENCOUNTER — Ambulatory Visit: Admitting: Nurse Practitioner

## 2024-05-03 ENCOUNTER — Encounter: Payer: Self-pay | Admitting: Nurse Practitioner

## 2024-05-03 ENCOUNTER — Ambulatory Visit: Admitting: Nurse Practitioner

## 2024-05-03 VITALS — BP 108/82 | HR 64 | Ht 66.0 in | Wt 158.0 lb

## 2024-05-03 DIAGNOSIS — Z794 Long term (current) use of insulin: Secondary | ICD-10-CM | POA: Diagnosis not present

## 2024-05-03 DIAGNOSIS — E782 Mixed hyperlipidemia: Secondary | ICD-10-CM | POA: Diagnosis not present

## 2024-05-03 DIAGNOSIS — E139 Other specified diabetes mellitus without complications: Secondary | ICD-10-CM

## 2024-05-03 LAB — POCT GLYCOSYLATED HEMOGLOBIN (HGB A1C): Hemoglobin A1C: 7.9 % — AB (ref 4.0–5.6)

## 2024-08-11 ENCOUNTER — Ambulatory Visit: Admitting: Nurse Practitioner

## 2024-09-19 ENCOUNTER — Ambulatory Visit
